# Patient Record
Sex: Female | Born: 1970 | Race: Black or African American | Hispanic: No | Marital: Single | Smoking: Former smoker
Health system: Southern US, Community
[De-identification: ages and names within clinical notes are randomized; demographics above are authoritative.]

## PROBLEM LIST (undated history)

## (undated) DIAGNOSIS — G4730 Sleep apnea, unspecified: Secondary | ICD-10-CM

## (undated) DIAGNOSIS — D649 Anemia, unspecified: Secondary | ICD-10-CM

## (undated) DIAGNOSIS — F32A Depression, unspecified: Secondary | ICD-10-CM

## (undated) DIAGNOSIS — T7840XA Allergy, unspecified, initial encounter: Secondary | ICD-10-CM

## (undated) DIAGNOSIS — I10 Essential (primary) hypertension: Secondary | ICD-10-CM

## (undated) DIAGNOSIS — E119 Type 2 diabetes mellitus without complications: Secondary | ICD-10-CM

## (undated) DIAGNOSIS — E785 Hyperlipidemia, unspecified: Secondary | ICD-10-CM

## (undated) DIAGNOSIS — Z5189 Encounter for other specified aftercare: Secondary | ICD-10-CM

## (undated) DIAGNOSIS — F419 Anxiety disorder, unspecified: Secondary | ICD-10-CM

## (undated) DIAGNOSIS — J40 Bronchitis, not specified as acute or chronic: Secondary | ICD-10-CM

## (undated) DIAGNOSIS — F329 Major depressive disorder, single episode, unspecified: Secondary | ICD-10-CM

## (undated) HISTORY — DX: Essential (primary) hypertension: I10

## (undated) HISTORY — PX: ABDOMINAL HYSTERECTOMY: SHX81

## (undated) HISTORY — DX: Type 2 diabetes mellitus without complications: E11.9

## (undated) HISTORY — PX: BARIATRIC SURGERY: SHX1103

## (undated) HISTORY — DX: Sleep apnea, unspecified: G47.30

## (undated) HISTORY — DX: Hyperlipidemia, unspecified: E78.5

## (undated) HISTORY — DX: Allergy, unspecified, initial encounter: T78.40XA

## (undated) HISTORY — PX: WISDOM TOOTH EXTRACTION: SHX21

## (undated) HISTORY — DX: Encounter for other specified aftercare: Z51.89

## (undated) HISTORY — DX: Anemia, unspecified: D64.9

## (undated) SURGERY — Surgical Case
Anesthesia: *Unknown

---

## 1997-10-25 ENCOUNTER — Emergency Department (HOSPITAL_COMMUNITY): Admission: EM | Admit: 1997-10-25 | Discharge: 1997-10-25 | Payer: Self-pay | Admitting: Emergency Medicine

## 1998-01-08 ENCOUNTER — Ambulatory Visit: Admission: RE | Admit: 1998-01-08 | Discharge: 1998-01-08 | Payer: Self-pay | Admitting: Internal Medicine

## 1999-08-10 ENCOUNTER — Emergency Department (HOSPITAL_COMMUNITY): Admission: EM | Admit: 1999-08-10 | Discharge: 1999-08-10 | Payer: Self-pay | Admitting: Emergency Medicine

## 2000-01-22 ENCOUNTER — Encounter: Payer: Self-pay | Admitting: Obstetrics and Gynecology

## 2000-01-22 ENCOUNTER — Observation Stay (HOSPITAL_COMMUNITY): Admission: AD | Admit: 2000-01-22 | Discharge: 2000-01-22 | Payer: Self-pay | Admitting: Obstetrics and Gynecology

## 2000-01-27 ENCOUNTER — Other Ambulatory Visit: Admission: RE | Admit: 2000-01-27 | Discharge: 2000-01-27 | Payer: Self-pay | Admitting: Obstetrics and Gynecology

## 2000-02-18 ENCOUNTER — Encounter: Admission: RE | Admit: 2000-02-18 | Discharge: 2000-02-18 | Payer: Self-pay | Admitting: Obstetrics and Gynecology

## 2000-06-23 ENCOUNTER — Emergency Department (HOSPITAL_COMMUNITY): Admission: EM | Admit: 2000-06-23 | Discharge: 2000-06-23 | Payer: Self-pay | Admitting: Emergency Medicine

## 2000-08-02 ENCOUNTER — Emergency Department (HOSPITAL_COMMUNITY): Admission: EM | Admit: 2000-08-02 | Discharge: 2000-08-02 | Payer: Self-pay | Admitting: Emergency Medicine

## 2000-09-03 ENCOUNTER — Other Ambulatory Visit: Admission: RE | Admit: 2000-09-03 | Discharge: 2000-09-03 | Payer: Self-pay | Admitting: Obstetrics and Gynecology

## 2000-09-03 ENCOUNTER — Encounter (INDEPENDENT_AMBULATORY_CARE_PROVIDER_SITE_OTHER): Payer: Self-pay

## 2001-10-17 ENCOUNTER — Emergency Department (HOSPITAL_COMMUNITY): Admission: EM | Admit: 2001-10-17 | Discharge: 2001-10-17 | Payer: Self-pay | Admitting: Emergency Medicine

## 2002-04-26 ENCOUNTER — Encounter: Payer: Self-pay | Admitting: Obstetrics and Gynecology

## 2002-04-26 ENCOUNTER — Ambulatory Visit (HOSPITAL_COMMUNITY): Admission: RE | Admit: 2002-04-26 | Discharge: 2002-04-26 | Payer: Self-pay | Admitting: Obstetrics and Gynecology

## 2002-07-11 ENCOUNTER — Encounter (INDEPENDENT_AMBULATORY_CARE_PROVIDER_SITE_OTHER): Payer: Self-pay | Admitting: Specialist

## 2002-07-11 ENCOUNTER — Ambulatory Visit (HOSPITAL_COMMUNITY): Admission: RE | Admit: 2002-07-11 | Discharge: 2002-07-11 | Payer: Self-pay | Admitting: Obstetrics and Gynecology

## 2003-04-21 HISTORY — PX: MYOMECTOMY: SHX85

## 2003-10-11 ENCOUNTER — Inpatient Hospital Stay (HOSPITAL_COMMUNITY): Admission: AD | Admit: 2003-10-11 | Discharge: 2003-10-12 | Payer: Self-pay | Admitting: Obstetrics and Gynecology

## 2003-11-29 ENCOUNTER — Encounter: Admission: RE | Admit: 2003-11-29 | Discharge: 2003-11-29 | Payer: Self-pay | Admitting: Obstetrics and Gynecology

## 2004-01-15 ENCOUNTER — Other Ambulatory Visit: Admission: RE | Admit: 2004-01-15 | Discharge: 2004-01-15 | Payer: Self-pay | Admitting: Obstetrics and Gynecology

## 2004-01-23 ENCOUNTER — Inpatient Hospital Stay (HOSPITAL_COMMUNITY): Admission: RE | Admit: 2004-01-23 | Discharge: 2004-01-25 | Payer: Self-pay | Admitting: Obstetrics and Gynecology

## 2004-01-23 ENCOUNTER — Encounter (INDEPENDENT_AMBULATORY_CARE_PROVIDER_SITE_OTHER): Payer: Self-pay | Admitting: Specialist

## 2004-03-31 ENCOUNTER — Ambulatory Visit (HOSPITAL_COMMUNITY): Admission: RE | Admit: 2004-03-31 | Discharge: 2004-03-31 | Payer: Self-pay | Admitting: Obstetrics and Gynecology

## 2004-04-14 ENCOUNTER — Inpatient Hospital Stay (HOSPITAL_COMMUNITY): Admission: AD | Admit: 2004-04-14 | Discharge: 2004-04-14 | Payer: Self-pay | Admitting: Obstetrics and Gynecology

## 2004-10-24 ENCOUNTER — Inpatient Hospital Stay (HOSPITAL_COMMUNITY): Admission: AD | Admit: 2004-10-24 | Discharge: 2004-10-24 | Payer: Self-pay | Admitting: Obstetrics and Gynecology

## 2004-11-05 ENCOUNTER — Inpatient Hospital Stay (HOSPITAL_COMMUNITY): Admission: AD | Admit: 2004-11-05 | Discharge: 2004-11-05 | Payer: Self-pay | Admitting: *Deleted

## 2005-04-20 HISTORY — PX: LAPAROSCOPIC VAGINAL HYSTERECTOMY: SUR798

## 2005-08-04 ENCOUNTER — Ambulatory Visit: Payer: Self-pay | Admitting: Cardiology

## 2005-08-04 ENCOUNTER — Inpatient Hospital Stay (HOSPITAL_COMMUNITY): Admission: EM | Admit: 2005-08-04 | Discharge: 2005-08-05 | Payer: Self-pay | Admitting: Emergency Medicine

## 2005-08-25 ENCOUNTER — Ambulatory Visit: Payer: Self-pay

## 2005-08-25 ENCOUNTER — Encounter: Payer: Self-pay | Admitting: Cardiology

## 2006-05-12 ENCOUNTER — Emergency Department (HOSPITAL_COMMUNITY): Admission: EM | Admit: 2006-05-12 | Discharge: 2006-05-12 | Payer: Self-pay | Admitting: Emergency Medicine

## 2006-07-08 ENCOUNTER — Emergency Department (HOSPITAL_COMMUNITY): Admission: EM | Admit: 2006-07-08 | Discharge: 2006-07-08 | Payer: Self-pay | Admitting: Emergency Medicine

## 2007-04-21 HISTORY — PX: OTHER SURGICAL HISTORY: SHX169

## 2007-05-25 ENCOUNTER — Encounter: Payer: Self-pay | Admitting: Emergency Medicine

## 2007-05-26 ENCOUNTER — Inpatient Hospital Stay (HOSPITAL_COMMUNITY): Admission: EM | Admit: 2007-05-26 | Discharge: 2007-05-27 | Payer: Self-pay | Admitting: Emergency Medicine

## 2007-05-27 ENCOUNTER — Emergency Department (HOSPITAL_COMMUNITY): Admission: EM | Admit: 2007-05-27 | Discharge: 2007-05-27 | Payer: Self-pay | Admitting: Emergency Medicine

## 2007-06-13 ENCOUNTER — Other Ambulatory Visit: Admission: RE | Admit: 2007-06-13 | Discharge: 2007-06-13 | Payer: Self-pay | Admitting: Gynecology

## 2007-08-21 ENCOUNTER — Inpatient Hospital Stay (HOSPITAL_COMMUNITY): Admission: AD | Admit: 2007-08-21 | Discharge: 2007-08-21 | Payer: Self-pay | Admitting: Gynecology

## 2007-09-01 ENCOUNTER — Encounter: Payer: Self-pay | Admitting: Gynecology

## 2007-09-01 ENCOUNTER — Ambulatory Visit (HOSPITAL_BASED_OUTPATIENT_CLINIC_OR_DEPARTMENT_OTHER): Admission: RE | Admit: 2007-09-01 | Discharge: 2007-09-02 | Payer: Self-pay | Admitting: Gynecology

## 2008-03-12 ENCOUNTER — Emergency Department (HOSPITAL_COMMUNITY): Admission: EM | Admit: 2008-03-12 | Discharge: 2008-03-12 | Payer: Self-pay | Admitting: Emergency Medicine

## 2008-05-09 ENCOUNTER — Ambulatory Visit: Payer: Self-pay | Admitting: Gynecology

## 2008-06-13 ENCOUNTER — Other Ambulatory Visit: Admission: RE | Admit: 2008-06-13 | Discharge: 2008-06-13 | Payer: Self-pay | Admitting: Gynecology

## 2008-06-13 ENCOUNTER — Encounter: Payer: Self-pay | Admitting: Gynecology

## 2008-06-13 ENCOUNTER — Ambulatory Visit: Payer: Self-pay | Admitting: Gynecology

## 2008-09-13 ENCOUNTER — Ambulatory Visit: Payer: Self-pay | Admitting: Gynecology

## 2009-06-12 ENCOUNTER — Emergency Department (HOSPITAL_COMMUNITY): Admission: EM | Admit: 2009-06-12 | Discharge: 2009-06-12 | Payer: Self-pay | Admitting: Emergency Medicine

## 2009-06-21 ENCOUNTER — Ambulatory Visit: Payer: Self-pay | Admitting: Gynecology

## 2009-06-21 ENCOUNTER — Other Ambulatory Visit: Admission: RE | Admit: 2009-06-21 | Discharge: 2009-06-21 | Payer: Self-pay | Admitting: Gynecology

## 2010-01-03 ENCOUNTER — Ambulatory Visit: Payer: Self-pay | Admitting: Gynecology

## 2010-04-20 HISTORY — PX: BUNIONECTOMY: SHX129

## 2010-06-23 ENCOUNTER — Encounter: Payer: Self-pay | Admitting: Gynecology

## 2010-07-07 ENCOUNTER — Encounter: Payer: Self-pay | Admitting: Gynecology

## 2010-08-14 ENCOUNTER — Other Ambulatory Visit (HOSPITAL_COMMUNITY)
Admission: RE | Admit: 2010-08-14 | Discharge: 2010-08-14 | Disposition: A | Discharge status: Home / Self Care | Payer: Managed Care, Other (non HMO) | Source: Ambulatory Visit | Attending: Gynecology | Admitting: Gynecology

## 2010-08-14 ENCOUNTER — Encounter (INDEPENDENT_AMBULATORY_CARE_PROVIDER_SITE_OTHER): Payer: Managed Care, Other (non HMO) | Admitting: Gynecology

## 2010-08-14 ENCOUNTER — Other Ambulatory Visit: Payer: Self-pay | Admitting: Gynecology

## 2010-08-14 DIAGNOSIS — N760 Acute vaginitis: Secondary | ICD-10-CM

## 2010-08-14 DIAGNOSIS — Z01419 Encounter for gynecological examination (general) (routine) without abnormal findings: Secondary | ICD-10-CM

## 2010-08-14 DIAGNOSIS — R809 Proteinuria, unspecified: Secondary | ICD-10-CM

## 2010-08-14 DIAGNOSIS — Z124 Encounter for screening for malignant neoplasm of cervix: Secondary | ICD-10-CM | POA: Insufficient documentation

## 2010-09-02 NOTE — Discharge Summary (Signed)
NAME:  Catherine [name], Catherine [name]                 ACCOUNT NO.:  [id number]   MEDICAL RECORD NO.:  [id number]          PATIENT TYPE:  INP   LOCATION:  1439                         FACILITY:  [facility]   PHYSICIAN:  Hillery Aldo, M.D.   DATE OF BIRTH:  02/23/1971   DATE OF ADMISSION:  05/25/2007  DATE OF DISCHARGE:  05/27/2007                               DISCHARGE SUMMARY   PRIMARY CARE PHYSICIAN:  Dr. Renaye Rakers.   DISCHARGE DIAGNOSES:  1. Severe iron deficiency anemia status post 4 units of packed red      blood cells.  2. Hypertension.  3. Recurrent uterine fibroids   DISCHARGE MEDICATIONS:  1. Nu-Iron 150 b.i.d.  2. Benicar/hydrochlorothiazide:  Resume home dosage.   CONSULTATIONS:  None.   BRIEF ADMISSION HPI:  This 40 year old female presented to the hospital  with complaints of racing heart and dyspnea with exertion.  Upon initial  evaluation in the emergency department, she was found to have a  hemoglobin of 6 and was subsequently admitted for further evaluation and  blood transfusion.  For the full details, please see the dictated report  done by Dr. Roxan Hockey.   PROCEDURES AND DIAGNOSTIC STUDIES:  1. Two views of the chest on May 25, 2007 showed stable chronic      bilateral parenchymal scarring and cardiomegaly.  No active      disease.  2. Abdominal/transvaginal ultrasound on May 26, 2007 showed      probable fibroid disease with the largest cluster in the fundus.      There was trace fluid.   DISCHARGE LABORATORY VALUES:  Sodium was 141, potassium 4.5, chloride  111, bicarb 24, BUN 11, creatinine 0.75, glucose 101. White blood cell  count was 9.0, hemoglobin 10, hematocrit 32.3, platelets 254.   HOSPITAL COURSE BY PROBLEM:  1. One iron deficiency anemia:  The patient was admitted and an anemia      panel was checked which showed a iron level of 14, TIBC of 474, 3%      saturation, and a ferritin of less than 1, all consistent with iron      deficiency anemia.  Her  B12 and folate studies were within normal      limits.  The patient, upon questioning, admitted to heavy periods      with a history of fibroid-induced menorrhagia in the past status      post myomectomy in the past.  It was felt that the patients iron      deficiency was likely due to regrowth of fibroid tissue prompting      [place] to obtain an ultrasound of her abdomen.  The ultrasound did show      regrowth of fibroid tissue.  It was felt that the patient's iron      deficiency is secondary to recurrent menorrhagia due to recurrent      fibroid growth.  The patient's hemoglobin and hematocrit have      remained stable status post 4 units of packed red blood cells.  She      is not complaining of any melena  or hematochezia.  She was put on      an iron supplement at discharge and instructed to follow up with      her primary care physician for an OB/GYN referral.  2. Hypertension:  The patient was normotensive throughout her hospital      stay.  She is instructed to resume her antihypertensives on      discharge as her final blood pressure did reveal a diastolic      pressure of 97 mmHg. .  3. Fibroids:  Again, the patient was instructed to follow up with an      OB/GYN for consideration of treatment given her severe iron      deficiency anemia.   DISPOSITION:  The patient is medically stable and will be discharged  home.      Hillery Aldo, M.D.  Electronically Signed     CR/MEDQ  D:  05/27/2007  T:  05/28/2007  Job:  [id number]   cc:   Renaye Rakers, M.D.  Fax: [phone]

## 2010-09-02 NOTE — H&P (Signed)
NAME:  Catherine [name], Catherine [name]                 ACCOUNT NO.:  [id number]   MEDICAL RECORD NO.:  [id number]          PATIENT TYPE:  INP   LOCATION:  1827                         FACILITY:  [facility]   PHYSICIAN:  Michaelyn Barter, M.D. DATE OF BIRTH:  [birth year]   DATE OF ADMISSION:  05/25/2007  DATE OF DISCHARGE:                              HISTORY & PHYSICAL   PRIMARY CARE PHYSICIAN:  Renaye Rakers, MD   CHIEF COMPLAINT:  Shortness of breath.   HISTORY OF PRESENT ILLNESS:  Catherine [name] is a 40 year old female with a  past medical history of hypertension and chronic anemia, who states that  last night/early this morning at approximately 2:30 she ran up a flight  of stairs to use the bathroom.  After urinating she went to lay down.  Shortly after lying in bed she began to feel shortness of breath and her  heart began to race. She also felt hot over her face and shaky.  This  sensation lasted 10 minutes.  She indicated that she splashed some cold  water on her face and was able to return to bed and sleep.  Approximately 2 hours later she had to urinate again.  Therefore she  went to the bathroom.  After going go the bathroom she returned to her  bed.  Sometime there afterwards she had developed another episode of  shortness of breath, racing heartbeat, hot sensation of her face.  This  too was short-lived.  Later in the morning while at work, the patient  states that she felt tired throughout the day.  After leaving work and  returning home, she began to feel warm in her face.  Therefore She  decided to call EMS.  She denies having any chest pain throughout the  episodes of shortness of breath and racing heartbeat.  There were no  fevers, no nausea, no vomiting.  No obvious bright red blood in her  stools.   PAST MEDICAL HISTORY:  1. Hypertension.  2. Chronic anemia.  3. Questionable history of sarcoidosis.  4. The patient has a history of severe microcytic, hypochromic anemia      that dates back  as far as April 2007 and at that time the patient      was noted to have had a hemoglobin of 6.3 for which she received a      blood transfusion which increased her hemoglobin to 8.6 with a      hematocrit of 28.4.  During that visit the patient also presented      with chest pain.   PAST SURGICAL HISTORY:  C-section x2.   CURRENT MEDICATIONS:  The patient currently states that she has not  taken any medications since November of 2008.  Prior to that she was  taking Benicar hydrochlorothiazide.   ALLERGIES:  NO KNOWN DRUG ALLERGIES.   SOCIAL HISTORY:  The patient smokes approximately 1/2 a pack of  cigarettes per day and has been doing so for at least 15 years.  Alcohol  occasionally.   FAMILY HISTORY:  Mother has history of hypertension and asthma.  Father  died from heart disease while in his 59s.   REVIEW OF SYSTEMS:  As per HPI.   PHYSICAL EXAMINATION:  GENERAL:  The patient is awake, cooperative, in  no obvious respiratory distress.  VITAL SIGNS:  Her temperature is 97.5.  Blood pressure 143/92.  Heart  rate 84.  Respirations 12.  O2 sat is 100%.  HEENT:  Normocephalic, atraumatic, anicteric.  Extraocular movements are  intact.  Oral mucosa is pink.  No thrush, no exudates.  NECK:  Supple, no JVD, no lymphadenopathy.  No thyromegaly.  CARDIAC:  S1 and S2 present.  Regular rate and rhythm.  RESPIRATORY:  No crackles or wheezes.  ABDOMEN:  Soft, nontender, nondistended.  Positive bowel sounds.  EXTREMITIES:  No leg edema.  NEUROLOGIC:  The patient is alert and oriented x3.  Cranial nerves 2-12  are intact.  MUSCULOSKELETAL:  5/5 upper and lower extremity strength.   LABS:  Sodium 132, potassium 3.7, chloride 104, CO2 23, glucose 107, BUN  8, creatinine 0.71, bilirubin total 0.8, alk phos 43, SGOT 19, SGPT 14,  total protein 6.8, albumin 4.2, calcium 8.7.  WBC 6.1.  Hemoglobin 6.0.  Hematocrit 22.1.  Platelets 353.  Urinalysis negative.   EKG reveals normal sinus rhythm.   No Q-waves, no ST segment  abnormalities.   ASSESSMENT AND PLAN:  1. Severe anemia.  Source of the patient's anemia is questionable.      Prior discharge summaries indicate that the patient chronically      suffers from severe anemia.  Will check the patient's stools for      occult blood.  Will transfuse the patient at least 4 units of      prbcs.  Will monitor her hemoglobin and hematocrit q.8 h. over the      next 24 hours.  Consider consultation with GI versus hematology.      Will also start the patient on Protonix.  2. Hypertension.  The patient's blood pressure currently is slightly      uncontrolled.  Will start the patient on antihypertensive      medication.      Michaelyn Barter, M.D.  Electronically Signed     OR/MEDQ  D:  05/25/2007  T:  05/25/2007  Job:  [id number]   cc:   Renaye Rakers, M.D.

## 2010-09-02 NOTE — H&P (Signed)
NAME:  Catherine [name], Catherine [name]                 ACCOUNT NO.:  [id number]   MEDICAL RECORD NO.:  [id number]           PATIENT TYPE:   LOCATION:                                 FACILITY:   PHYSICIAN:  Timothy P. Fontaine, M.D.DATE OF BIRTH:  1971/01/27   DATE OF ADMISSION:  09/01/2007  DATE OF DISCHARGE:                              HISTORY & PHYSICAL   CHIEF COMPLAINT:  Menorrhagia, leiomyoma, severe iron deficiency anemia.   HISTORY OF PRESENT ILLNESS:  A 40 year old G39 P2 AB2 female presents to  [facility] Emergency Room complaining of shortness of breath and was  found to have a hemoglobin of 5.  Evaluation confirmed multiple small  myomas.  She is status post myomectomy in the past with a history of  heavier menses.  The patient was subsequently placed on Lupron for  menstrual suppression, hemoglobin recovery.  Options for management were  reviewed with her to include repeat myomectomy, uterine artery  embolization, endometrial ablation, hormonal suppression, hysterectomy -  all of which was discussed, understood and accepted, and she wants to  proceed with hysterectomy.  She had a sonohysterogram which confirmed  multiple small myomas, the largest measuring 32 with no intracavitary  abnormalities.  The patient's follow-up hemoglobin was 9.5 after being  placed on high-dose iron.  She had a normal thyroid function, and she is  admitted at this time for hysterectomy.   PAST MEDICAL HISTORY:  Significant for:  1. Anxiety.  2. Panic disorder.   PAST SURGICAL HISTORY:  Includes:  1. Myomectomy 2005.  2. Cesarean section x2.   CURRENT MEDICATIONS:  1. Iron supplementation.  2. Lexapro 10 mg daily.   ALLERGIES:  SIGNIFICANT FOR LATEX.   REVIEW OF SYSTEMS:  Noncontributory.   FAMILY HISTORY:  Noncontributory.   SOCIAL HISTORY:  Noncontributory.   ADMISSION PHYSICAL EXAM:  Afebrile.  Vital signs are stable.  HEENT: Normal.  LUNGS:  Clear.  CARDIAC:  Regular rate without rubs, murmurs  or gallops.  ABDOMINAL EXAM:  Benign.  PELVIC:  External, BUS, vagina normal.  Cervix normal.  Uterus mildly  enlarged, nontender.  Adnexa without masses or tenderness.   ASSESSMENT:  A 40 year old G26 P2, AB2 female with history of leiomyoma  status post myomectomy in the past, worsening menorrhagia, found to be  severely iron-deficient anemia with hemoglobin of 5, placed on high dose  iron, recovery to 9, subsequently placed on Lupron for menstrual  suppression, for hysterectomy.  Options for management to include  conservative hormonal manipulation, repeat myomectomy, uterine artery  embolization, endometrial ablation attempt, hysterectomy were all  reviewed, and she wants to proceed with hysterectomy.  The patient and I  discussed the absolute irreversible sterility associated with  hysterectomy.  She is comfortable with this decision.  Sexuality  following hysterectomy was also reviewed and the potential for orgasmic  dysfunction as well as persistent dyspareunia was discussed, understood  and accepted.  Ovarian conservation issues were reviewed.  The options  of keeping both ovaries versus removing both ovaries were discussed, and  the issues and risks of both decisions reviewed, and the  patient wants  to keep both of her ovaries.  She understands that she is at risk in the  future for ovarian disease that may require reoperation as well as the  risk of ovarian cancer in the future, and she understands and accepts  this.  She does give me permission to remove one or both ovaries if  significant disease is encountered during the surgery.  We plan on  attempted LAVH.  She does understand particularly given her past  surgical history with myomectomy and cesarean that there is a potential  for significant adhesive disease and that we may not be able to complete  the procedure laparoscopically, and at that point, we will convert to an  abdominal approach to do an abdominal hysterectomy.   The acute  intraoperative and postoperative risks associated with hysterectomy were  reviewed, instrumentation, trocar placement, multiple port sites,  insufflation, use of sharp and blunt dissection, electrocautery,  harmonic scalpel all were discussed, and the risks of infection  requiring prolonged antibiotics, abscess formation or hematoma formation  requiring reoperation and drainage, wound complications requiring  opening and draining of incisions, closure by secondary intention, long-  term wound issues to include scar formation, keloid and cosmetic issues  as well as the risk of hernia formation were all discussed, understood  and accepted.  The risks of hemorrhage necessitating transfusion and  risks of transfusion were reviewed to include transfusion reaction,  hepatitis, HIV, Mad Cow Disease, and other unknown entities.  Her last  hemoglobin was 9.5 before Lupron suppression, and her latest hemoglobin  is pending at time of this dictation.  The realistic risk of damage to  internal organs including bowel, bladder, ureters, vessels and nerves  were reviewed, particular with her surgical history.  She is on bowel  prep and will receive antibiotic prophylaxis, but she clearly  understands that there is a risk of damage to these structures that may  necessitate major exploratory reparative surgeries or future reparative  surgeries to include bowel resection, bladder repair, ureteral damage  repair, reimplantation of to including ostomy formation were all  discussed, understood and accepted.  The patient's questions were  answered to her satisfaction.  She is ready to proceed with surgery.      Timothy P. Fontaine, M.D.  Electronically Signed     TPF/MEDQ  D:  08/25/2007  T:  08/25/2007  Job:  [id number]

## 2010-09-02 NOTE — Discharge Summary (Signed)
NAME:  Starrett, Jerzey                 ACCOUNT NO.:  [id number]   MEDICAL RECORD NO.:  [id number]          PATIENT TYPE:  AMB   LOCATION:  [facility]                         FACILITY:  [facility]   PHYSICIAN:  Timothy P. Fontaine, M.D.DATE OF BIRTH:  1970-04-29   DATE OF ADMISSION:  09/01/2007  DATE OF DISCHARGE:  09/02/2007                               DISCHARGE SUMMARY   DISCHARGE DIAGNOSES:  1. Leiomyomata uteri.  2. Menorrhagia.  3. Severe iron deficiency anemia.   PROCEDURE:  Laparoscopic-assisted vaginal hysterectomy Sep 01, 2007,  pathology pending.   HOSPITAL COURSE:  A 40 year old, G5, P2 female severe iron deficiency  anemia with a prior hemoglobin of 5 underwent uncomplicated LAVH with  findings of multiple leiomyoma.  The patient's postop course was  uncomplicated and she was discharged on postoperative day #1 ambulating  well, tolerating a regular diet with a postoperative hemoglobin of 9,  preoperative hemoglobin of 10.  The patient received precautions,  instructions and follow-up and will be seen in the office 2 weeks  following discharge and received a prescription for Tylox #20 one to two  p.o. q. 4-6 hours p.r.n. pain.      Timothy P. Fontaine, M.D.  Electronically Signed     TPF/MEDQ  D:  09/02/2007  T:  09/02/2007  Job:  [id number]

## 2010-09-02 NOTE — Op Note (Signed)
NAME:  Catherine [name], Catherine [name]                 ACCOUNT NO.:  [id number]   MEDICAL RECORD NO.:  [id number]          PATIENT TYPE:  AMB   LOCATION:  NESC                         FACILITY:  [facility]   PHYSICIAN:  Timothy P. Fontaine, M.D.DATE OF BIRTH:  [birth year]   DATE OF PROCEDURE:  09/01/2007  DATE OF DISCHARGE:                               OPERATIVE REPORT   PREOPERATIVE DIAGNOSES:  1. Leiomyoma.  2. Menorrhagia.  3. Iron-deficiency anemia.  4. Skin lesion.   POSTOPERATIVE DIAGNOSES:  1. Leiomyoma.  2. Menorrhagia.  3. Iron-deficiency anemia.  4. Skin lesion.   PROCEDURE:  Laparoscopic-assisted vaginal hysterectomy, excision of  anterior abdominal wall skin lesion.   SURGEON:  Dr. Colin Broach.   ASSISTANT:  Dr. Eda Paschal.   ANESTHETIC:  General.   COMPLICATIONS:  None.   ESTIMATED BLOOD LOSS:  200 mL.   SPECIMEN:  1. Uterus morcellated.  2. Skin lesion   FINDINGS:  EUA external BUS and vagina normal.  Cervix normal, uterus  approximately 12 weeks size, nodular consistent with leiomyoma.  Adnexa  without gross masses.  Anterior abdominal wall with a raised umbilicated  skin lesion left of the umbilicus. Laparoscopic uterus enlarged with  multiple myomas, anterior posterior cul-de-sacs normal, right and left  ovaries grossly normal, right and left fallopian tube segments grossly  normal, upper abdominal exam grossly normal.   INDICATIONS:  A 40 year old, G36, P2 female with a significant iron  deficiency anemia due to her menorrhagia with leiomyoma, hemoglobin of 5  due to her bleeding.  The patient also notes the morning of surgery that  she has a newly developed skin lesion to the left of her umbilicus that  she asked me to remove at the time of surgery.   PROCEDURE:  The patient was taken to the operating room, underwent  general anesthesia and was placed in the low dorsal lithotomy position,  received an abdominal perineal vaginal preparation with Betadine  solution.   Bladder emptied with indwelling Foley catheterization, placed  in sterile technique.  EUA performed and the patient was draped in the  usual fashion.  Latex allergy precautions were used during the  procedure.  A vertical infraumbilical incision was made. The 10-mm  laparoscopic trocar direct entry OptiVu scope was placed within the  abdomen under direct visualization without difficulty and the abdomen  was subsequently insufflated.  Examination of pelvic organs and upper  abdominal exam was carried out with findings noted above.  The right and  left suprapubic 5-mm ports were then placed under direct visualization  after transillumination for the vessels without difficulty.  The Hulka  tenaculum had been placed on the cervix during the EUA to aid in  manipulation during the procedure. The uterus was elevated from the  pelvis using the harmonic scalpel. The right and left uterine ovarian  pedicles were identified and transected without difficulty. The  parametrial tissues were then transected bilaterally using the harmonic  scalpel and the right and left the round ligaments were transected again  using the harmonic scalpel.  The anterior vesicouterine peritoneal fold  was then sharply incised  and the bladder flap was sharply bluntly  developed initially.  At this point, attention was then turned to the  vaginal portion of the procedure. The patient was placed in the high  dorsal lithotomy position, the cervix visualized with a weighted  speculum, the Hulka tenaculum removed.  The cervix grasped with a Jacobs  tenaculum and the cervical mucosa was circumferentially injected using  lidocaine epinephrine mixture. The cervical mucosa was then  circumferentially incised and the paracervical plane sharply developed.  The anterior vesicouterine plane was sharply developed and ultimately  the anterior cul-de-sac was entered.  The posterior cul-de-sac was then  sharply entered without difficulty.   A long weighted speculum was placed  and the right and left uterosacral ligaments were clamped, cut and  ligated using zero Vicryl suture and tagged for future reference.  The  uterus was then progressively freed from its attachments through  clamping, cutting and ligating of the paracervical cardinal ligaments  and the parametrial tissues using zero Vicryl suture. The right and left  uterine vessels were identified and ligated using zero Vicryl suture and  subsequently the uterus was ultimately delivered through the vagina  through coring and removing the uterus in separate segments.  The  remaining attached parametrial tissues were clamped, cut and ligated  using zero Vicryl suture.  The cul-de-sac was irrigated.  Hemostasis was  visualized.  The long weighted speculum replaced by a short weighted  speculum, the posterior vaginal cuff grasped with an Allis clamp.  A  tail sponge placed within the cul-de-sac to pack the intestines from the  operative field.  The posterior vaginal mucosa was run from uterosacral  ligament to uterosacral ligament using zero Vicryl suture in a running  interlocking stitch.  The cul-de-sac was irrigated,  again hemostasis  visualized, the  packing removed and the vagina closed anterior to  posterior using zero Vicryl suture in interrupted figure-of-eight  stitch.  Irrigation of the vagina showed adequate hemostasis. The  patient was placed in the low dorsal lithotomy position. After  regloving, the abdomen was reinsufflated and relaparoscopy showed  hemostasis at all the surgical sites internally.  All surgical sites  were reinspected under a low pressure situation showing adequate  hemostasis.  The 5-mm ports were removed. Again hemostasis visualized  and the infraumbilical port was then backed out under direct  visualization showing adequate hemostasis.  No evidence of hernia  formation. Interrupted zero Vicryl suture was placed infraumbilically, a   subcutaneous fascial stitch.  All skin incisions injected using 0.25%  Marcaine and all skin incisions closed using 4-0 plain suture in simple  cuticular stitch.  The raised periumbilical skin lesion was then excised  in its entirety and the skin incisions closed using 4-0 plain in an  interrupted stitch.  Sterile dressings were applied.  The patient placed  in the supine position, awakened without difficulty and taken to the  recovery room in good condition having tolerated the procedure well.      Timothy P. Fontaine, M.D.  Electronically Signed     TPF/MEDQ  D:  09/01/2007  T:  09/01/2007  Job:  [id number]

## 2010-09-05 NOTE — Op Note (Signed)
NAME:  Catherine [name], Catherine [name]                 ACCOUNT NO.:  [id number]   MEDICAL RECORD NO.:  [id number]          PATIENT TYPE:  INP   LOCATION:  9399                          [facility]ILITY:  [facility]   PHYSICIAN:  Naima A. Dillard, M.D. DATE OF BIRTH:  [birth year]   DATE OF PROCEDURE:  01/23/2004  DATE OF DISCHARGE:                                 OPERATIVE REPORT   PREOPERATIVE DIAGNOSES:  Symptomatic fibroids.   POSTOPERATIVE DIAGNOSES:  Symptomatic fibroids.   PROCEDURE:  Abdominal myomectomy.   ANESTHESIA:  General endotracheal tube.   ESTIMATED BLOOD LOSS:  200 mL.   URINE OUTPUT:  650 mL.   IV FLUIDS:  2000 mL crystalloid.   SURGEON:  Naima A. Normand Sloop, M.D.   ASSISTANT:  Osborn Coho, M.D.   FINDINGS:  About 10 week size uterus with multiple fibroids, about 20  fibroids were removed, some as large as 2 cm and some as small as 1/2 cm.  Normal appearing tubes and ovaries bilaterally, normal abdominal anatomy.   The patient was consented for the procedure, taken to the operating room and  given general anesthesia, placed in the dorsal supine position.  A  Pfannenstiel skin incision was then made with the scalpel over top of her  previous incision and carried down to the fascia using Bovie cautery. The  fascia was then incised in the midline and extended bilaterally using Bovie  cautery.  A Kocher __________ placed on the inferior aspect of the fascia  which was dissected off the rectus muscle both sharply and bluntly. The  superior aspect of the fascia was dissected off the rectus muscle in a  similar fashion. The rectus muscle was separated in the midline, peritoneum  was identified and entered sharply and extended bilaterally.  The patient  was placed in Trendelenburg, the bowel was packed away with moist laparotomy  sponges, O'Connor-O'Sullivan retractor was placed and epinephrine was 1:200  mL were placed anteriorly. An incision was made from the fundus to the  middle of the  anterior portion of the uterus and several fibroids were  dissected out both bluntly and sharply. Another small incision was made  right at the back of the uterus about 3 cm and fibroids removed both sharply  and bluntly. Both tubes remained intact. The integrity of the tubes were  kept intact. The endometrium was not opened or entered. The myometrium was  then reapproximated using #0 Vicryl with figure-of-eight stitches and also a  running locked stitch. The serosa was then reapproximated using 3-0 Vicryl.  The uterus was then irrigated and noted to be hemostatic. Intercede was  placed both posteriorly and anteriorly on top of the uterus. The uterus was  returned to the abdominal cavity. The peritoneum was closed using 2-0  Vicryl. Any bleeding area seen on the muscle was made hemostatic with Bovie  cautery and the fascia was closed  with #0 Vicryl in a running fashion. The subcutaneous layer was irrigated  and made hemostatic with the Bovie cautery. The skin was closed with 3-0  Monocryl in a subcuticular fashion. Sponge, lap and needle counts  were  correct x2. The patient wen to the recovery room in stable condition.      NAD/MEDQ  D:  01/23/2004  T:  01/23/2004  Job:  [id number]

## 2010-09-05 NOTE — Discharge Summary (Signed)
NAME:  Catherine [name], Catherine [name]                 ACCOUNT NO.:  [id number]   MEDICAL RECORD NO.:  [id number]          PATIENT TYPE:  INP   LOCATION:  9309                          [facility]  [facility]   PHYSICIAN:  Naima A. Dillard, M.D. DATE OF BIRTH:  November 01, 1970   DATE OF ADMISSION:  01/23/2004  DATE OF DISCHARGE:                                 DISCHARGE SUMMARY   DISCHARGE MEDICATIONS:  Motrin, Cardizem, Percocet, K-Dur,  hydrochlorothiazide, and Colace.   PREADMISSION DIAGNOSES:  1.  Symptomatic fibroids.  2.  Secondary infertility.   POSTOPERATIVE DIAGNOSIS:  Status post abdominal myomectomy.   The patient is to go home and remain on pelvic rest.  CCOB instructions  given.  The patient is to follow up with me in 6 weeks, on February 29, 2004, at 9:45 a.m.   HOSPITAL COURSE:  [name] underwent a total abdominal myomectomy on  January 23, 2004 without difficulty.  On postoperative day #0 she had some  nausea but no vomiting.  Her Tmax was as high as 100.6.  The rest of her  vital signs remained stable.  On postoperative day #1, the patient was  without complaint.  She had no nausea and vomiting.  She was tolerating  clear liquids.  Tmax was 100.6.  Abdomen was soft, nondistended.  Incision  was clean, dry, and intact.  Preoperative hemoglobin was 13.1, postoperative  hemoglobin was 12.1, platelets 258, and white count 12.4.  The patient was  given Motrin around the clock.  She was given a regular diet and her  Cardizem, because of her increased blood pressure, was increased to 240 mg  q.h.s. with hydrochlorothiazide in the morning per her primary care doctor,  Dr. Allyne Gee.  On postoperative day #2, the patient had remained afebrile for  greater than 24 hours, stable vital signs.  She was tolerating p.o., with  flatus.  Systolic blood pressure was 131 to 152 with diastolic blood  pressure 83 to 95.  Heart rate was regular rate and rhythm.  Lungs were  clear to auscultation bilaterally.   Abdomen was nondistended, soft, and  nontender.  Incision had some mild serous drainage.  Her incision was probed  with a Q-tip and no seroma or hematoma was seen.  Extremities had no  cyanosis, clubbing, or edema.  The patient is stable and deemed to have  benefited from her hospital stay.  She will be discharged home.  She was  told to call with any greenish discharge or heavy discharge from her  incision, or heavy vaginal bleeding.  The patient was to remain on pelvic  rest.  She was given strict instructions and will follow up with me in 6  weeks.   CONDITION ON DISCHARGE:  Stable.  The patient will be discharged to home.      NAD/MEDQ  D:  01/25/2004  T:  01/25/2004  Job:  [id number]

## 2010-09-05 NOTE — Discharge Summary (Signed)
NAME:  Catherine [name], Catherine [name]                 ACCOUNT NO.:  [id number]   MEDICAL RECORD NO.:  [id number]          PATIENT TYPE:  INP   LOCATION:  NA                            [facility]   PHYSICIAN:  Naima A. Dillard, M.D. DATE OF BIRTH:  05-11-1970   DATE OF ADMISSION:  DATE OF DISCHARGE:                                 DISCHARGE SUMMARY   CHIEF COMPLAINT:  Symptomatic fibroids, anemia, and secondary infertility.   HISTORY AND PHYSICAL:  The patient is a 40 year old African American female,  gravida 4, para 2-0-2-2, who is having an abdominal myomectomy secondary to  severe anemia, secondary to menometrorrhagia.  The patient has tried iron  with NSAIDs without any relief.  The patient has had a hemoglobin as low as  6.8 and has a history of a blood transfusion in the past.  The patient was  diagnosed with fibroids on her most recent ultrasound done in June 2005.  Uterine length was 12.7 x 8.0 x 6.4.  The patient noted three large fibroids  posterior mid was 2.8-cm and also 1.2-cm.  In the anterior portion of the  uterus is a 3-cm fibroid and in the fundal region there is a 1.8-cm fibroid.  The patient desires to conceive and has decided for abdominal myomectomy for  treatment of fibroids.  All treatment of fibroids were reviewed.  The  patient was also offered a referral for laparoscopic myomectomy and has  decided to proceed with abdominal myomectomy.  Risks and benefits of all  procedures reviewed.  The patient denies having any bleeding disorders.   PAST MEDICAL HISTORY:  As above.   PAST OBSTETRIC HISTORY:  1.  Elective abortion x 2 in the first trimester.  2.  Cesarean section x 2 without any complications.   MEDICATIONS:  Folic acid, iron, and Lupron.  The patient received Lupron  three months ago in order to help build up her iron stores.   SOCIAL HISTORY:  Significant for 5-6 cigarettes a day.  No alcohol or drug  use.   FAMILY HISTORY:  Significant for diabetes,  hypertension.  Denies any GYN  cancer.   PAST MEDICAL HISTORY:  For the patient is significant for high blood  pressure.   PAST SURGICAL HISTORY:  In March 2004, the patient had a diagnostic  laparoscopy in which she was noted to have a left proximal tubal occlusion.   REVIEW OF SYSTEMS:  CARDIOVASCULAR:  Significant for increased blood  pressure.  GENITOURINARY:  As above.  MUSCULOSKELETAL:  Unremarkable.  PSYCHIATRIC:  Unremarkable.  RESPIRATORY:  Unremarkable.   PHYSICAL EXAMINATION:  VITAL SIGNS:  The patient's blood pressure is  150/100, weight is 221.  EYES:  Pupils are equal.  EARS:  Hearing is normal.  THROAT:  Clear.  NECK:  Thyroid is not enlarged.  HEART:  Regular rate and rhythm.  LUNGS:  Clear to auscultation bilaterally.  BREASTS:  No masses, discharge, skin changes, or nipple retraction.  BACK:  No CVA tenderness bilaterally.  ABDOMEN:  Nontender.  No masses or organomegaly.  EXTREMITIES:  No cyanosis, clubbing, or edema.  NEURO:  Within normal limits.  __________  vaginal exam is within normal limits.  Cervix is nontender and  without lesions.  Uterus is about 9 weeks size, slightly irregular.  Adnexa  have no masses.   ASSESSMENT:  1.  Symptomatic fibroids.  The patient is currently on Lupron.  2.  History of anemia.  3.  History of a left proximal tubal occlusion.   PLAN:  Do an abdominal myomectomy.  The patient understands the risks are  but not limited to bleeding, infection, damage to internal organs like  bowel, bladder, major blood vessels, risk of a transfusion, and also a risk  of a hysterectomy in the event of hemorrhage which could lead to permanent  infertility.  The patient was also offered referral for a laparoscopic  myomectomy but has decided to proceed with abdominal myomectomy.  The  patient has proximal tubal occlusion which could not be fixed during  laparoscopy, so the patient was referred to Dr. Kearney Hard, interventional  radiologist, for  cannulization of the fallopian tube.  The patient will  proceed with that six weeks after having a myomectomy.  The patient  understands the risk of the procedure and desires to proceed.      NAD/MEDQ  D:  01/22/2004  T:  01/22/2004  Job:  [id number]

## 2010-09-05 NOTE — H&P (Signed)
NAME:  Catherine [name], ATTAWAY                 ACCOUNT NO.:  [id number]   MEDICAL RECORD NO.:  [id number]          PATIENT TYPE:  INP   LOCATION:  1832                         FACILITY:  [facility]   PHYSICIAN:  Olga Millers, M.D. LHCDATE OF BIRTH:  1970-11-28   DATE OF ADMISSION:  08/04/2005  DATE OF DISCHARGE:                                HISTORY & PHYSICAL   NOTE:  This patient has two medical record numbers.  The second record  number is [id number].   SUMMARY OF HISTORY:  Catherine [name] is a 40 year old African American female who  presented to [facility] Emergency Room complaining of  chest discomfort.   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  This discomfort did not radiate nor was  it associated with nausea, vomiting or shortness of breath.  Her discomfort  was 8 on a scale of 0 to 10.  She told her supervisor and was able to take a  break and drink some water and relax.  She said the discomfort eventually  eased off.  She is not quite sure of the duration but she feels it was less  than 30 minutes and it was a 0 on a scale of 0 to 10.  She was able to  finish her work shift without difficulty.  After visiting her mother  yesterday evening while driving home and at approximately 11p.m. she  developed the same symptoms associated with tingling in her left arm and in  her lips.  She did not have any radiation, nausea or vomiting or shortness  of breath.  She preceded to the emergency room for further evaluation.   In the emergency room, an EKG did not show any acute changes.  However, the  point of care markers were slightly abnormal in regards to her troponins and  her second MB.  Thus, we were consulted.   She also describes increasing amount of headaches over the last week or two  and she has attributed this to high blood  pressure as she has not been able  to take her medication secondary to financial reasons for the preceding two  weeks.  She also recalls a similar occurrence in 2001, for which she was  evaluated by EMS and they told her this was related to anxiety; however, she  thinks the above episode is much more intense than it was in 2001.   ALLERGIES:  NO KNOWN DRUG ALLERGIES.   MEDICATIONS PRIOR TO ADMISSION:  Benicar/hydrochlorothiazide unknown dosage  daily.  Sular at unknown dosage daily.  She has not had an Sular for the  preceding two weeks secondary to finances.   PAST MEDICAL HISTORY:  1.  Hypertension which she does not check at home.  2.  G4, P2-0-2-2.  3.  Chronic anemia.  4.  Sarcoidosis (uncertain of how diagnosis was made, no records available).   PAST SURGICAL HISTORY:  1.  Cesarean section x2.  2.  Status post myomectomy in  October 2005, secondary to fibroids.   She denies any problems with diabetes, CVA, myocardial infarction, COPD,  bleeding, dyscrasias or thyroid dysfunction.   SOCIAL HISTORY:  She resides in [place], [place], with her two  daughters.  She is employed at [employer].  She smokes  approximately half a pack per day and has been doing so for the last 15  years.  She may have a mixed drink every two months.  She denies any drugs  or herbal medications.  She has recently been trying to follow a low salt  diet.  She does not exercise.   FAMILY HISTORY:  Mother is alive in her 52s with a history of hypertension  and asthma.  Her father is deceased in his 14s, specifics unknown but she  states that he had fluid around his heart.  She has one brother deceased in  his 68s with HIV/AIDS.  One sister alive in her 27s with history of  hypertension.   REVIEW OF SYSTEMS:  In addition to above, notable for recent headaches.  Some visual blurriness, particularly at night, rare nocturia.  Anxiety with  increased stress at work and finances.   Constipation.   PHYSICAL EXAMINATION:  GENERAL APPEARANCE:  A well-nourished, well-  developed, pleasant African American female in no apparent distress.  VITAL SIGNS:  Temperature 98.6, blood pressure 113/74, pulse 83,  respirations 22, 96% sat on room air.  HEENT:  Unremarkable except for tongue piercing.  NECK:  Supple without thyromegaly, adenopathy, JVD or carotid bruits.  CHEST:  Symmetrical excursion.  LUNGS:  Clear to auscultation.  CARDIOVASCULAR:  PMI is not displaced.  Regular rate and rhythm.  Normal S1  and S2 without murmurs, rubs, gallops, or clicks.  SKIN:  Integument is intact.  She does have a T tattooed on her left  ankle.  ABDOMEN:  Slightly rounded, obese, bowel sounds present without  organomegaly, masses or tenderness.  EXTREMITIES:  No clubbing, cyanosis, or edema.  Peripheral pulses all  symmetrical and intact without abdominal or femoral bruits.  MUSCULOSKELETAL:  Unremarkable.  NEUROLOGIC:  Unremarkable.   Chest x-ray one view, shows cardiomegaly, opacity in the left mid lung with  bilateral scarring, recommended PA and lateral.   EKG shows normal sinus rhythm, normal intervals, normal axis, probably early  repolarization with early R.  No old EKGs are available for comparison.   ISTAT showed an H&H of 10.2 and 30.  Sodium 138, potassium 3.1, BUN 13,  creatinine 1, glucose 92.  D-dimer 0.32, PTT 29, PT 13.  Point of care  markers showed abnormal troponins of 0.11 and 0.27.  Her second MB was 8.2.   1.  Substernal chest discomfort of uncertain etiology, with abnormal point      of care troponins, however, EKG does not show any acute changes.  2.  Multiple social stresses.  3.  Anemia.  4.  History of hypertension, unable to take medications secondary to      financial issues.  5.  History as noted per past medical history.   DISPOSITION:  Dr. Jens Som reviewed the patient's history, spoke with and examined patient and agrees with the above.  Ms.  Catherine [name] will be admitted to  [facility] and ruled out for myocardial infarction.  Her initial CK-MB and troponin will be called to myself.  If these are  elevated, she will be added on to the cardiac catheterization schedule for  Tuesday for further evaluation  of her discomfort and lab abnormalities.  If  she rules out for myocardial infarction, an outpatient echocardiogram  and  Myoview will be arranged.  We will continue her IV  heparin and nitroglycerin as well as added a beta-blocker and Statin.  Will  perform a urine drug screen, have case manager to assist with financial  needs.  We will also order a follow-up PA and lateral chest x-ray in regards  to her left mid lung nodule.      Joellyn Rued, P.A. [facility]    ______________________________  Olga Millers, M.D. [facility]    EW/MEDQ  D:  08/04/2005  T:  08/04/2005  Job:  [id number]   cc:   Renaye Rakers, M.D.  Fax: [phone]

## 2010-09-05 NOTE — Op Note (Addendum)
NAME:  Catherine [name], Catherine [name]                           ACCOUNT NO.:  [id number]   MEDICAL RECORD NO.:  [id number]                   PATIENT TYPE:  AMB   LOCATION:  SDC                                  [facility]ILITY:  [facility]   PHYSICIAN:  Naima [name]. Dillard, M.D.              DATE OF BIRTH:  [birth year]   DATE OF PROCEDURE:  07/11/2002  DATE OF DISCHARGE:                                 OPERATIVE REPORT   PREOPERATIVE DIAGNOSES:  1. Left tubal occlusion.  2. Secondary infertility.  3. Uterine fibroids.   POSTOPERATIVE DIAGNOSES:  1. Probable left proximal tubal occlusion.  2. Secondary infertility.  3. Uterine fibroids.   PROCEDURE:  1. Diagnostic laparoscopy.  2. Chromopertubation.  3. Excision of peritubal cyst.   ANESTHESIA:  General endotracheal tube.   SURGEON:  Naima [name]. Normand Sloop, M.D.   ASSISTANT:  Dois Davenport [name]. Rivard, M.D.   COMPLICATIONS:  None.   ESTIMATED BLOOD LOSS:  Minimal.   IV FLUIDS:  2000 mL crystalloid.   URINE OUTPUT:  400 mL clear urine.   FINDINGS:  [name] 10 week sized uterus, probably [name] 5 cm fundal fibroid.  Normal  appearing tubes and ovaries bilaterally, however, with chromopertubation the  right tube did fill and spill.  The left tube did not fill past the proximal  end of the tube suggesting proximal occlusion, however, grossly the tube was  normal.  There was an S-shaped pattern about 2 cm away from the cornua which  was probably occlusion, ____ QA MARKER: 122 ____but ov  tube appeared normal.  The patient also had normal anatomy, normal appearing  liver, bowels, and appendix.   PROCEDURE IN DETAIL:  The patient was taken to the operating room where she  was given general anesthesia, placed in the dorsal lithotomy position, and  prepped and draped in [name] normal sterile fashion.  [name] bivalve speculum was  placed into the vagina.  The anterior lip of the cervix was grasped with [name]  single tooth tenaculum.  The uterus was sounded to 9 cm.  The [facility]  manipulator was  placed into the uterine cavity.  Attention was then turned  to the patient's abdomen where 5 mL of 0.25% plain Marcaine was injected  into the incision site.  [name] 10 mm vertical incision was then made with the  scalpel.  The Veress was placed at [name] 45 degree angle while tenting the  abdominal wall.  Intra-abdominal placement was confirmed with fluid filled  syringe and [name] decrease in CO2 pressure.  The abdomen was insufflated with  about 3 L of CO2 gas.  The Veress needle was removed.  [name] 10-mm trocar was  placed while tenting abdominal wall to 45 degree angle.  Intra-abdominal  placement was confirmed with the laparoscope.  The findings noted above were  seen.  The patient was placed in Trendelenburg.  [name] long probe was placed  through the  operative port.  The patient's fallopian tubes were identified,  followed out to the fimbriated end, noted to be normal on both sides.  Both  ovaries also looked normal.  Her uterus was [name] fibroid uterus with [name] large  fibroid at the fundus about 5 cm long.  We then pertubated with indigo  carmine.  The solution as 5 mL of indigo carmine and 400 mL of lactated  Ringer's.  Upon chromopertubation, the left fallopian tube had an immediate  fill and spill.  The right fallopian tube did not have the spill so then we  put another 50 mL into the uterine cavity.  Then the fluid started to escape  and fall to the floor from the vagina.  Attention was then turned back to  the vagina where the bivalve speculum was then replaced.  The [facility]  manipulator was removed.  Tenaculum was placed and the acorn manipulator was  placed into the uterine cavity.  Chromopertubation was once again done and  the right fallopian tube, however, still did not spill so [name] 5-mm trocar was  placed in the patient's right lower quadrant.  After 5 mL 0.25% Marcaine was  placed an incision was made on the skin.  It was placed under direct  visualization of the laparoscope.  The peritubal cysts were  then lifted and  excised with atraumatic __________  scissors.  The patient's left fallopian  tube was lifted up and an alligator forceps was placed into the tube and  noted to place well on proximal tube noting that there was no proximal tubal  occlusion, just distal tubal occlusion.  Every site was noted to be  hemostatic.  All instruments were removed from the abdomen under direct  visualization of the laparoscope.  The 5-mm trocar was under direct  visualization of the laparoscope.  The air was allowed to leave the abdomen.  The 10-mm trocar was removed under direct visualization of the laparoscope.  The fascia was closed with 0 Vicryl.  The skin incisions were closed with 3-  0 Vicryl in subcuticular fashion.  The acorn bivalve speculum and tenaculum  were removed without difficulty.  Sponge, lap, and needle counts were  correct x2.  The patient went to recovery room in stable condition.  The  patient will be offered radiologic cannulation of the proximal tubal  occlusion in the postoperative period.                                               Naima [name]. Normand Sloop, M.D.    NAD/MEDQ  D:  07/11/2002  T:  07/11/2002  Job:  [id number]

## 2010-09-05 NOTE — Discharge Summary (Signed)
NAME:  Catherine [name], Catherine [name]                 ACCOUNT NO.:  [id number]   MEDICAL RECORD NO.:  [id number]          PATIENT TYPE:  INP   LOCATION:  3729                         FACILITY:  [facility]   PHYSICIAN:  Olga Millers, M.D. LHCDATE OF BIRTH:  [birth year]   DATE OF ADMISSION:  08/04/2005  DATE OF DISCHARGE:  08/05/2005                           DISCHARGE SUMMARY - REFERRING   SUMMARY OF HISTORY:  Catherine [name] is a 40 year old African-American female who  presents to [facility] Emergency Room with chest discomfort.  She stated  that on the day prior to admission at approximately 12:30 while at work, she  gradually developed anterior chest cramping sensation. This was not  associated with shortness of breath, radiation, nausea, or vomiting.  She  told her supervisor, and she rested.  Eventually within 30 minutes, her  discomfort eased from an 8 to a 0, and she was able to finish her work  shift.  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  Due to reoccurring symptoms, she presented to the emergency room for  further evaluation.   Of note, it is also noted that she has had some increased level of headaches  this past week, and she feels this has been secondary to her elevated  history of hypertension; however, she has not checked her blood pressure,  and incidentally, she has not been taking her Sular for the last 2 weeks  because she has not been able to purchase this.   She does recall a similar occurrence of chest discomfort in 2001 which EMS  told her was related to anxiety, and she had not had any further evaluation.  Now it is similar but much more intense.   PAST MEDICAL HISTORY:  Notable for:  1.  Hypertension, but she does not check her blood pressure at home.  2.  Chronic anemia for which she used to take iron.  3.  Sarcoidosis, unsure of how patient was diagnosed with this.  4.   G4, P2-0-2-2 with last menstrual period July 25, 2005.   LABORATORY DATA:  Initial chest x-ray shows cardiomegaly with bilateral  scarring, no edema.  There was a nodular opacity in the left mid lung.  Two-  view was performed to follow up on this suspicious nodule; however, this did  not confirm any specific finding and may have reflected a small granuloma or  vessel on end.   Admission weight was 218.7.   In the emergency room, an I-STAT was 10.2 and 30.  This was repeated with an  actual CBC and showed hemoglobin 6.3, hematocrit 22.  MCV and MCHC were low  at 59 and 28.1, respectively.  Platelets 584, WBC 7.2.  Post transfusion on  April 18: Hemoglobin 8.6, hematocrit 28.4, MCV 64.9, platelets 517, WBC 8.2.  Admission PTT 29, PT 13.  D-dimer 0.32.  I-STAT showed sodium 138, potassium  3.1, BUN 13, creatinine 1. CMET showed potassium of 3.2, BUN 12, creatinine  0.8, glucose 101.  Normal LFTs.  Hemoglobin A1c was 5.9.  CK-MB and troponin  were negative x2 despite abnormal point-of-care markers in the emergency  room. TSH was 1.651.  Urine drug screen was negative.   EKG shows normal sinus rhythm, normal axis, early R wave, nonspecific ST-T  wave changes.   HOSPITAL COURSE:  Catherine [name] was initially placed on IV heparin with plans  that, if she ruled out for myocardial infarction, she should undergo  outpatient stress Myoview; if she ruled in for myocardial infarction, to  undergo cardiac catheterization.  However, her CBC came back with a low  hemoglobin, and she was transfused 2 units packed red blood cells which  improved her hemoglobin and hematocrit.  By the morning of April 18, the  patient stated that she felt much better, and Dr. Jens Som felt she could be  discharged to home.  At the time of discharge, it was noted that patient  admitted to not taking her iron as instructed with her history of chronic  anemia secondary to heavy menses.   DISCHARGE DIAGNOSES:  1.  Chest  discomfort, probably related to severe microcytic hypochromic      anemia requiring transfusion.  2.  Hypertension with noncompliance with medication and diet.  3.  Obesity.   DISPOSITION:  Catherine [name] is being discharged home, asked to maintain low-  salt, low-fat, low-cholesterol diet.  She was asked to bring all medications  to all appointments.  Her new medications, asked her to resume iron at 325 3  times a day and to continue Benicar/hydrochlorothiazide 20 plus 12.5 daily  and Sular 10 mg daily for which she received a new prescription.  She was  asked to consider a blood pressure diary and arrange a follow with Dr. Parke Simmers  for further management of her hypertension and to consider blood work this  week to follow up on her CBC.  She was also asked to follow up with Dr.  Jacob Moores given her heavy menses and chronic anemia.  It is noted at the time of  discharge, initially when she was admitted, iron studies were ordered,  however, were discontinued and have been reordered and are in the process at  the time of this dictation; however, it is noted these are post transfusion.   She will have a stress Myoview and echocardiogram on Aug 25, 2005, at Dr.  Ludwig Clarks office at 7:30 a.m.  Results will be forwarded to Dr. Parke Simmers for  further evaluation.  If results of her stress Myoview are abnormal, she will  need a followup with Dr. Jens Som.   Discharge time greater than 30 minutes.      Joellyn Rued, P.A. [facility]    ______________________________  Olga Millers, M.D. [facility]    EW/MEDQ  D:  08/05/2005  T:  08/05/2005  Job:  [id number]   cc:   Renaye Rakers, M.D.  Fax: [phone]   Dr. Jacob Moores, OB-GYN

## 2010-09-05 NOTE — H&P (Signed)
NAME:  Catherine Catherine [name], Catherine Catherine [name]                           ACCOUNT NO.:  [id number]   MEDICAL RECORD NO.:  [id number]                   PATIENT TYPE:  AMB   LOCATION:  SDC                                  FACILITY:  [facility]   PHYSICIAN:  Catherine Catherine [name], M.D.              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  The patient  states she has Catherine [name] history of blood transfusion in 10/01 for severe vaginal  bleeding.  She states that the anemia work up was negative for any other  causes but he menometrorrhagia.  She denies having any other bleeding  disorders.  She states that she was diagnosed with fibroids two years ago.  She has taken Megace for this up to four times Catherine [name] day, with some help has  been off the Megace for several months now.  The patient states that she is  also interested in having Catherine [name] child.  She has had unprotected intercourse for  greater than one year with no pregnancy.  Her menses occur every 28 days  lasting for 5 days.  She has to change the pad every two hours.  She is on  Cardizem, no contraception, does have fibroids on ultrasound.  No  medications.  No menopausal symptoms.  No vaginal discharge.  No abdominal  pain or increased stress.   PAST GYN HISTORY:  Significant for menarche at age 11and most long lasting  for five days.  She has Catherine [name] history of trichomonas in the past.  No history of  abnormal Pap smear.   PAST OB HISTORY:  Significant for elective abortion x2 in the first  trimester without any complications and cesarean section x2 without any  complications.   PAST MEDICAL HISTORY:  As above.   MEDICATIONS:  Folic acid and iron.   FAMILY HISTORY:  Significant for  hypertension.   SOCIAL HISTORY:  Significant for tobacco use 5-6 cigarettes per day.  No  alcohol or illicit drug use.   REVIEW OF SYSTEMS:  ENDOCRINE:  Unremarkable. GI:  Unremarkable.  CARDIOVASCULAR:  Significant for anemia.  GENITOURINARY:  As above.  MUSCULOSKELETAL:  Unremarkable.  PSYCHIATRIC:  Unremarkable.   PHYSICAL EXAMINATION:  VITAL SIGNS:  Blood pressure 140/80, 197 pounds.  HEENT:  Pupils are equal.  Hearing is normal.  Throat is clear.  Thyroid is  not enlarged.  HEART:  Regular rate and rhythm.  CHEST:  Clear to auscultation bilaterally.  BREASTS:  No masses, discharge, skin changes or nipple retraction  bilaterally.  BACK:  No CVA tenderness bilaterally.  ABDOMEN:  Nontender.  No masses or organomegaly.  EXTREMITIES:  No  cyanosis, clubbing or edema.  NEUROLOGICAL:  Within normal limits.  VULVA:  Within normal limits.  PELVIS:  Nontender without lesions.  UTERUS:  Catherine [name] 10-12 week size and irregular with no masses.   LABORATORY DATA:  The patient had hemoglobin of 7.8 on 02/08/02 and after  taking iron on 04/12/02, the patient's hemoglobin was 10.5.  Ultrasound is  significant for uterus 10x5 cm with several small fibroids.  The biggest  fibroid being 5x1.2 cm.  She had Catherine [name] hysterosalpingogram which noted Catherine [name] normal  uterine cavity and right fallopian tube with spill.  The left fallopian tube  did not have any spill.  GC/Chlamydia cultures were negative.   ASSESSMENT:  Menometorrhagia and secondary infertility, uterine fibroids.   PLAN:  The patient was offered diagnostic laparoscopy lysis of adhesions,  possible tuboplasty versus just referral to reproduction endocrinologist.  The patient has decided to proceed with diagnostic laparoscopy lysis of  adhesions possible tuboplasty.  She understands the risks are but not  limited to bleeding, infection, damage to internal organs such as bowel or  bladder and major blood vessels.  Also, with tuboplasty, she understands   that she is at risk for tubal pregnancy.  The patient has consented to  procedure and decided to proceed with above.                                               Catherine Catherine [name], M.D.    NAD/MEDQ  D:  07/10/2002  T:  07/11/2002  Job:  [id number]

## 2010-10-06 ENCOUNTER — Emergency Department (HOSPITAL_COMMUNITY): Payer: Managed Care, Other (non HMO)

## 2010-10-06 ENCOUNTER — Emergency Department (HOSPITAL_COMMUNITY)
Admission: EM | Admit: 2010-10-06 | Discharge: 2010-10-07 | Disposition: A | Discharge status: Home / Self Care | Payer: Managed Care, Other (non HMO) | Attending: Emergency Medicine | Admitting: Emergency Medicine

## 2010-10-06 DIAGNOSIS — E669 Obesity, unspecified: Secondary | ICD-10-CM | POA: Insufficient documentation

## 2010-10-06 DIAGNOSIS — I10 Essential (primary) hypertension: Secondary | ICD-10-CM | POA: Insufficient documentation

## 2010-10-06 DIAGNOSIS — R0789 Other chest pain: Secondary | ICD-10-CM | POA: Insufficient documentation

## 2010-10-06 LAB — BASIC METABOLIC PANEL
CO2: 26 mEq/L (ref 19–32)
Chloride: 103 mEq/L (ref 96–112)
GFR calc non Af Amer: >60 mL/min (ref >60)
Glucose, Bld: 82 mg/dL (ref 70–99)
Potassium: 3.9 mEq/L (ref 3.5–5.1)
Sodium: 139 mEq/L (ref 135–145)

## 2010-10-06 LAB — CBC
Hemoglobin: 13.6 g/dL (ref 12.0–15.0)
MCH: 31.4 pg (ref 26.0–34.0)
MCHC: 33.7 g/dL (ref 30.0–36.0)
RDW: 13.5 % (ref 11.5–15.5)
WBC: 8.6 10*3/uL (ref 4.0–10.5)

## 2010-10-06 LAB — DIFFERENTIAL
Basophils Absolute: 0.1 10*3/uL (ref 0.0–0.1)
Basophils Relative: 1 % (ref 0–1)
Eosinophils Absolute: 0.5 10*3/uL (ref 0.0–0.7)
Lymphocytes Relative: 43 % (ref 12–46)
Lymphs Abs: 3.7 10*3/uL (ref 0.7–4.0)
Monocytes Relative: 8 % (ref 3–12)
Neutro Abs: 3.6 10*3/uL (ref 1.7–7.7)
Neutrophils Relative %: 43 % (ref 43–77)

## 2010-10-06 LAB — CK TOTAL AND CKMB (NOT AT ARMC): CK, MB: 2.2 ng/mL (ref 0.3–4.0)

## 2010-11-24 ENCOUNTER — Encounter: Payer: Self-pay | Admitting: Anesthesiology

## 2010-11-26 ENCOUNTER — Institutional Professional Consult (permissible substitution): Payer: Managed Care, Other (non HMO) | Admitting: Gynecology

## 2011-01-09 LAB — VITAMIN B12: Vitamin B-12: 383 (ref 211–911)

## 2011-01-09 LAB — BASIC METABOLIC PANEL
CO2: 24
Chloride: 111
Creatinine, Ser: 0.75
GFR calc Af Amer: >60
Glucose, Bld: 101 — ABNORMAL HIGH
Sodium: 141

## 2011-01-09 LAB — HEMOGLOBIN AND HEMATOCRIT, BLOOD
HCT: 21.5 — ABNORMAL LOW
HCT: 28.7 — ABNORMAL LOW
Hemoglobin: 5.8 — CL
Hemoglobin: 8.4 — ABNORMAL LOW

## 2011-01-09 LAB — DIFFERENTIAL
Basophils Absolute: 0.1
Eosinophils Absolute: 0.4
Eosinophils Relative: 5
Lymphocytes Relative: 36
Lymphs Abs: 2.2
Monocytes Absolute: 0.9
Monocytes Relative: 8
Neutrophils Relative %: 51
Neutrophils Relative %: 61

## 2011-01-09 LAB — COMPREHENSIVE METABOLIC PANEL
ALT: 14
AST: 19
Albumin: 4.2
Alkaline Phosphatase: 43
GFR calc non Af Amer: >60
Total Protein: 6.8

## 2011-01-09 LAB — URINALYSIS, ROUTINE W REFLEX MICROSCOPIC
Bilirubin Urine: NEGATIVE
Glucose, UA: NEGATIVE
Nitrite: NEGATIVE
Protein, ur: NEGATIVE
Specific Gravity, Urine: 1.008
Urobilinogen, UA: 0.2

## 2011-01-09 LAB — CBC
HCT: 22.1 — ABNORMAL LOW
Hemoglobin: 6 — CL
Hemoglobin: 10 — ABNORMAL LOW
MCHC: 27.2 — ABNORMAL LOW
MCHC: 30.3
MCHC: 30.8
MCV: 68 — ABNORMAL LOW
MCV: 68.1 — ABNORMAL LOW
Platelets: 353
Platelets: 270
RBC: 4.75
RDW: 33.2 — ABNORMAL HIGH

## 2011-01-09 LAB — CROSSMATCH

## 2011-01-09 LAB — RETICULOCYTES
RBC.: 3.99
RBC.: 3.9
Retic Ct Pct: 1
Retic Ct Pct: 0.6

## 2011-01-09 LAB — PREGNANCY, URINE: Preg Test, Ur: NEGATIVE

## 2011-01-09 LAB — IRON AND TIBC
Iron: 22 — ABNORMAL LOW
UIBC: 399

## 2011-01-09 LAB — URINE MICROSCOPIC-ADD ON

## 2011-01-09 LAB — FOLATE: Folate: 12.6

## 2011-01-14 LAB — DIFFERENTIAL
Eosinophils Absolute: 0.1
Lymphocytes Relative: 24
Lymphs Abs: 2.6
Monocytes Absolute: 1
Monocytes Relative: 10
Neutro Abs: 6.9
Neutrophils Relative %: 65

## 2011-01-14 LAB — WET PREP, GENITAL

## 2011-01-14 LAB — CBC
HCT: 29.2 — ABNORMAL LOW
Hemoglobin: 9 — ABNORMAL LOW
MCV: 77.2 — ABNORMAL LOW
RBC: 3.78 — ABNORMAL LOW
WBC: 10.7 — ABNORMAL HIGH

## 2011-01-20 LAB — POCT I-STAT, CHEM 8
Creatinine, Ser: 1
Glucose, Bld: 106 — ABNORMAL HIGH
Hemoglobin: 13.9
Potassium: 3.6

## 2011-01-20 LAB — POCT CARDIAC MARKERS
CKMB, poc: 1.4
Troponin i, poc: <0.05

## 2011-08-17 ENCOUNTER — Encounter: Payer: Managed Care, Other (non HMO) | Admitting: Gynecology

## 2011-08-31 ENCOUNTER — Encounter: Payer: Managed Care, Other (non HMO) | Admitting: Gynecology

## 2011-09-04 ENCOUNTER — Encounter: Payer: Managed Care, Other (non HMO) | Admitting: Gynecology

## 2011-10-27 ENCOUNTER — Encounter: Payer: Self-pay | Admitting: Gynecology

## 2011-10-27 ENCOUNTER — Ambulatory Visit (INDEPENDENT_AMBULATORY_CARE_PROVIDER_SITE_OTHER): Payer: Managed Care, Other (non HMO) | Admitting: Gynecology

## 2011-10-27 VITALS — BP 120/80 | Ht 67.0 in | Wt 266.0 lb

## 2011-10-27 DIAGNOSIS — Z01419 Encounter for gynecological examination (general) (routine) without abnormal findings: Secondary | ICD-10-CM

## 2011-10-27 NOTE — Patient Instructions (Signed)
Follow up in one year for annual gynecologic exam.  Consider Stop Smoking.  Help is available at [facility] smoking cessation program @ www.[place].com or 8176962885. OR 1-800-QUIT-NOW 262-626-4431) for free smoking cessation counseling.   Smoking Hazards Smoking cigarettes is extremely bad for your health. Tobacco smoke has over 200 known poisons in it. There are over 60 chemicals in tobacco smoke that cause cancer. Some of the chemicals found in cigarette smoke include:  Cyanide.  Benzene.  Formaldehyde.  Methanol (wood alcohol).  Acetylene (fuel used in welding torches).  Ammonia.  Cigarette smoke also contains the poisonous gases nitrogen oxide and carbon monoxide.  Cigarette smokers have an increased risk of many serious medical problems, including: Lung cancer.  Lung disease (such as pneumonia, bronchitis, and emphysema).  Heart attack and chest pain due to the heart not getting enough oxygen (angina).  Heart disease and peripheral blood vessel disease.  Hypertension.  Stroke.  Oral cancer (cancer of the lip, mouth, or voice box).  Bladder cancer.  Pancreatic cancer.  Cervical cancer.  Pregnancy complications, including premature birth.  Low birthweight babies.  Early menopause.  Lower estrogen level for women.  Infertility.  Facial wrinkles.  Blindness.  Increased risk of broken bones (fractures).  Senile dementia.  Stillbirths and smaller newborn babies, birth defects, and genetic damage to sperm.  Stomach ulcers and internal bleeding.  Children of smokers have an increased risk of the following, because of secondhand smoke exposure:  Sudden infant death syndrome (SIDS).  Respiratory infections.  Lung cancer.  Heart disease.  Ear infections.  Smoking causes approximately: 90% of all lung cancer deaths in men.  80% of all lung cancer deaths in women.  90% of deaths from chronic obstructive lung disease.  Compared with nonsmokers, smoking  increases the risk of: Coronary heart disease by 2 to 4 times.  Stroke by 2 to 4 times.  Men developing lung cancer by 23 times.  Women developing lung cancer by 13 times.  Dying from chronic obstructive lung diseases by 12 times.  Someone who smokes 2 packs a day loses about 8 years of his or her life. Even smoking lightly shortens your life expectancy by several years. You can greatly reduce the risk of medical problems for you and your family by stopping now. Smoking is the most preventable cause of death and disease in our society. Within days of quitting smoking, your circulation returns to normal, you decrease the risk of having a heart attack, and your lung capacity improves. There may be some increased phlegm in the first few days after quitting, and it may take months for your lungs to clear up completely. Quitting for 10 years cuts your lung cancer risk to almost that of a nonsmoker. WHY IS SMOKING ADDICTIVE? Nicotine is the chemical agent in tobacco that is capable of causing addiction or dependence.  When you smoke and inhale, nicotine is absorbed rapidly into the bloodstream through your lungs. Nicotine absorbed through the lungs is capable of creating a powerful addiction. Both inhaled and non-inhaled nicotine may be addictive.  Addiction studies of cigarettes and spit tobacco show that addiction to nicotine occurs mainly during the teen years, when young people begin using tobacco products.  WHAT ARE THE BENEFITS OF QUITTING?  There are many health benefits to quitting smoking.  Likelihood of developing cancer and heart disease decreases. Health improvements are seen almost immediately.  Blood pressure, pulse rate, and breathing patterns start returning to normal soon after quitting.  People  who quit may see an improvement in their overall quality of life.  Some people choose to quit all at once. Other options include nicotine replacement products, such as patches, gum, and nasal  sprays. Do not use these products without first checking with your caregiver. QUITTING SMOKING It is not easy to quit smoking. Nicotine is addicting, and longtime habits are hard to change. To start, you can write down all your reasons for quitting, tell your family and friends you want to quit, and ask for their help. Throw your cigarettes away, chew gum or cinnamon sticks, keep your hands busy, and drink extra water or juice. Go for walks and practice deep breathing to relax. Think of all the money you are saving: around $1,000 a year, for the average pack-a-day smoker. Nicotine patches and gum have been shown to improve success at efforts to stop smoking. Zyban (bupropion) is an anti-depressant drug that can be prescribed to reduce nicotine withdrawal symptoms and to suppress the urge to smoke. Smoking is an addiction with both physical and psychological effects. Joining a stop-smoking support group can help you cope with the emotional issues. For more information and advice on programs to stop smoking, call your doctor, your local hospital, or these organizations: American Lung Association - 1-800-LUNGUSA  American Cancer Society - 1-800-ACS-2345  Document Released: 05/14/2004 Document Revised: 12/17/2010 Document Reviewed: 01/16/2009 [facility] Patient Information 2012 [place], [place].  Smoking Cessation This document explains the best ways for you to quit smoking and new treatments to help. It lists new medicines that can double or triple your chances of quitting and quitting for good. It also considers ways to avoid relapses and concerns you may have about quitting, including weight gain. NICOTINE: A POWERFUL ADDICTION If you have tried to quit smoking, you know how hard it can be. It is hard because nicotine is a very addictive drug. For some people, it can be as addictive as heroin or cocaine. Usually, people make 2 or 3 tries, or more, before finally being able to quit. Each time you try to quit,  you can learn about what helps and what hurts. Quitting takes hard work and a lot of effort, but you can quit smoking. QUITTING SMOKING IS ONE OF THE MOST IMPORTANT THINGS YOU WILL EVER DO.  You will live longer, feel better, and live better.   The impact on your body of quitting smoking is felt almost immediately:   Within 20 minutes, blood pressure decreases. Pulse returns to its normal level.   After 8 hours, carbon monoxide levels in the blood return to normal. Oxygen level increases.   After 24 hours, chance of heart attack starts to decrease. Breath, hair, and body stop smelling like smoke.   After 48 hours, damaged nerve endings begin to recover. Sense of taste and smell improve.   After 72 hours, the body is virtually free of nicotine. Bronchial tubes relax and breathing becomes easier.   After 2 to 12 weeks, lungs can hold more air. Exercise becomes easier and circulation improves.   Quitting will reduce your risk of having a heart attack, stroke, cancer, or lung disease:   After 1 year, the risk of coronary heart disease is cut in half.   After 5 years, the risk of stroke falls to the same as a nonsmoker.   After 10 years, the risk of lung cancer is cut in half and the risk of other cancers decreases significantly.   After 15 years, the risk of coronary  heart disease drops, usually to the level of a nonsmoker.   If you are pregnant, quitting smoking will improve your chances of having a healthy baby.   The people you live with, especially your children, will be healthier.   You will have extra money to spend on things other than cigarettes.  FIVE KEYS TO QUITTING Studies have shown that these 5 steps will help you quit smoking and quit for good. You have the best chances of quitting if you use them together: 1. Get ready.  2. Get support and encouragement.  3. Learn new skills and behaviors.  4. Get medicine to reduce your nicotine addiction and use it correctly.   5. Be prepared for relapse or difficult situations. Be determined to continue trying to quit, even if you do not succeed at first.  1. GET READY  Set a quit date.   Change your environment.   Get rid of ALL cigarettes, ashtrays, matches, and lighters in your home, car, and place of work.   Do not let people smoke in your home.   Review your past attempts to quit. Think about what worked and what did not.   Once you quit, do not smoke. NOT EVEN A PUFF!  2. GET SUPPORT AND ENCOURAGEMENT Studies have shown that you have a better chance of being successful if you have help. You can get support in many ways.  Tell your family, friends, and coworkers that you are going to quit and need their support. Ask them not to smoke around you.   Talk to your caregivers (doctor, dentist, nurse, pharmacist, psychologist, and/or smoking counselor).   Get individual, group, or telephone counseling and support. The more counseling you have, the better your chances are of quitting. Programs are available at [employer] and health centers. Call your local health department for information about programs in your area.   Spiritual beliefs and practices may help some smokers quit.   Quit meters are Photographer that keep track of quit statistics, such as amount of "quit-time," cigarettes not smoked, and money saved.   Many smokers find one or more of the many self-help books available useful in helping them quit and stay off tobacco.  3. LEARN NEW SKILLS AND BEHAVIORS  Try to distract yourself from urges to smoke. Talk to someone, go for a walk, or occupy your time with a task.   When you first try to quit, change your routine. Take a different route to work. Drink tea instead of coffee. Eat breakfast in a different place.   Do something to reduce your stress. Take a hot bath, exercise, or read a book.   Plan something enjoyable to do every day. Reward yourself for  not smoking.   Explore interactive web-based programs that specialize in helping you quit.  4. GET MEDICINE AND USE IT CORRECTLY Medicines can help you stop smoking and decrease the urge to smoke. Combining medicine with the above behavioral methods and support can quadruple your chances of successfully quitting smoking. The U.S. Food and Drug Administration (FDA) has approved 7 medicines to help you quit smoking. These medicines fall into 3 categories.  Nicotine replacement therapy (delivers nicotine to your body without the negative effects and risks of smoking):   Nicotine gum: Available over-the-counter.   Nicotine lozenges: Available over-the-counter.   Nicotine inhaler: Available by prescription.   Nicotine nasal spray: Available by prescription.   Nicotine skin patches (transdermal): Available by prescription and over-the-counter.  Antidepressant medicine (helps people abstain from smoking, but how this works is unknown):   Bupropion sustained-release (SR) tablets: Available by prescription.   Nicotinic receptor partial agonist (simulates the effect of nicotine in your brain):   Varenicline tartrate tablets: Available by prescription.   Ask your caregiver for advice about which medicines to use and how to use them. Carefully read the information on the package.   Everyone who is trying to quit may benefit from using a medicine. If you are pregnant or trying to become pregnant, nursing an infant, you are under age 39, or you smoke fewer than 10 cigarettes per day, talk to your caregiver before taking any nicotine replacement medicines.   You should stop using a nicotine replacement product and call your caregiver if you experience nausea, dizziness, weakness, vomiting, fast or irregular heartbeat, mouth problems with the lozenge or gum, or redness or swelling of the skin around the patch that does not go away.   Do not use any other product containing nicotine while using a  nicotine replacement product.   Talk to your caregiver before using these products if you have diabetes, heart disease, asthma, stomach ulcers, you had a recent heart attack, you have high blood pressure that is not controlled with medicine, a history of irregular heartbeat, or you have been prescribed medicine to help you quit smoking.  5. BE PREPARED FOR RELAPSE OR DIFFICULT SITUATIONS  Most relapses occur within the first 3 months after quitting. Do not be discouraged if you start smoking again. Remember, most people try several times before they finally quit.   You may have symptoms of withdrawal because your body is used to nicotine. You may crave cigarettes, be irritable, feel very hungry, cough often, get headaches, or have difficulty concentrating.   The withdrawal symptoms are only temporary. They are strongest when you first quit, but they will go away within 10 to 14 days.  Here are some difficult situations to watch for:  Alcohol. Avoid drinking alcohol. Drinking lowers your chances of successfully quitting.   Caffeine. Try to reduce the amount of caffeine you consume. It also lowers your chances of successfully quitting.   Other smokers. Being around smoking can make you want to smoke. Avoid smokers.   Weight gain. Many smokers will gain weight when they quit, usually less than 10 pounds. Eat a healthy diet and stay active. Do not let weight gain distract you from your main goal, quitting smoking. Some medicines that help you quit smoking may also help delay weight gain. You can always lose the weight gained after you quit.   Bad mood or depression. There are a lot of ways to improve your mood other than smoking.  If you are having problems with any of these situations, talk to your caregiver. SPECIAL SITUATIONS AND CONDITIONS Studies suggest that everyone can quit smoking. Your situation or condition can give you a special reason to quit.  Pregnant women/new mothers: By  quitting, you protect your baby's health and your own.   Hospitalized patients: By quitting, you reduce health problems and help healing.   Heart attack patients: By quitting, you reduce your risk of a second heart attack.   Lung, head, and neck cancer patients: By quitting, you reduce your chance of a second cancer.   Parents of children and adolescents: By quitting, you protect your children from illnesses caused by secondhand smoke.  QUESTIONS TO THINK ABOUT Think about the following questions before you try to stop smoking.  You may want to talk about your answers with your caregiver.  Why do you want to quit?   If you tried to quit in the past, what helped and what did not?   What will be the most difficult situations for you after you quit? How will you plan to handle them?   Who can help you through the tough times? Your family? Friends? Caregiver?   What pleasures do you get from smoking? What ways can you still get pleasure if you quit?  Here are some questions to ask your caregiver:  How can you help me to be successful at quitting?   What medicine do you think would be best for me and how should I take it?   What should I do if I need more help?   What is smoking withdrawal like? How can I get information on withdrawal?  Quitting takes hard work and a lot of effort, but you can quit smoking. FOR MORE INFORMATION  Smokefree.gov (http://www.dav[place]-s[place].com/) provides free, accurate, evidence-based information and professional assistance to help support the immediate and long-term needs of people trying to quit smoking. Document Released: 03/31/2001 Document Revised: 12/17/2010 Document Reviewed: 01/21/2009 [facility] Patient Information 2012 [place], [place].

## 2011-10-27 NOTE — Progress Notes (Signed)
SHAKEITA VANDEVANDER 1970-06-05 [id number]        41 y.o.  for annual exam.  Doing well without complaints.  Past medical history,surgical history, medications, allergies, family history and social history were all reviewed and documented in the EPIC chart. ROS:  Was performed and pertinent positives and negatives are included in the history.  Exam: Sherrilyn Rist assistant Filed Vitals:   10/27/11 1537  BP: 120/80   General appearance  Normal Skin grossly normal Head/Neck normal with no cervical or supraclavicular adenopathy thyroid normal Lungs  clear Cardiac RR, without RMG Abdominal  soft, nontender, without masses, organomegaly or hernia Breasts  examined lying and sitting without masses, retractions, discharge or axillary adenopathy. Pelvic  Ext/BUS/vagina  normal   Adnexa  Without masses or tenderness    Anus and perineum  normal   Rectovaginal  normal sphincter tone without palpated masses or tenderness.    Assessment/Plan:  41 y.o. female for annual exam.    1. Status post LAVH for leiomyoma/menorrhagia. Doing well. 2. Pap smear. Last Pap smear 2012. No history of abnormal Pap smears before. No Pap done today. I review current screening guidelines and she is status post LAVH for benign indications in the option to stop altogether versus less frequent screening reviewed. 3. Mammography. Patient overdo and agrees to schedule this. SBE monthly reviewed. 4. Stop smoking. Reviewed stop smoking strategies with her. 5. Health maintenance. No blood work was done today as it's all done through her primary physician's office who she sees on a regular basis. Assuming she continues well from a gynecologic standpoint she will see [place] in a year, sooner as needed.    Dara Lords MD, 4:04 PM 10/27/2011

## 2011-11-03 ENCOUNTER — Other Ambulatory Visit: Payer: Self-pay | Admitting: Gynecology

## 2011-11-03 DIAGNOSIS — Z1231 Encounter for screening mammogram for malignant neoplasm of breast: Secondary | ICD-10-CM

## 2011-12-01 ENCOUNTER — Ambulatory Visit (HOSPITAL_COMMUNITY)
Admission: RE | Admit: 2011-12-01 | Discharge: 2011-12-01 | Disposition: A | Discharge status: Home / Self Care | Payer: Managed Care, Other (non HMO) | Source: Ambulatory Visit | Attending: Gynecology | Admitting: Gynecology

## 2011-12-01 DIAGNOSIS — Z1231 Encounter for screening mammogram for malignant neoplasm of breast: Secondary | ICD-10-CM | POA: Insufficient documentation

## 2012-03-23 ENCOUNTER — Other Ambulatory Visit: Payer: Self-pay

## 2012-03-23 ENCOUNTER — Emergency Department (HOSPITAL_COMMUNITY): Payer: BC Managed Care – PPO

## 2012-03-23 ENCOUNTER — Emergency Department (HOSPITAL_COMMUNITY)
Admission: EM | Admit: 2012-03-23 | Discharge: 2012-03-23 | Disposition: A | Discharge status: Home / Self Care | Payer: BC Managed Care – PPO | Attending: Emergency Medicine | Admitting: Emergency Medicine

## 2012-03-23 DIAGNOSIS — Z3202 Encounter for pregnancy test, result negative: Secondary | ICD-10-CM | POA: Insufficient documentation

## 2012-03-23 DIAGNOSIS — R059 Cough, unspecified: Secondary | ICD-10-CM | POA: Insufficient documentation

## 2012-03-23 DIAGNOSIS — R209 Unspecified disturbances of skin sensation: Secondary | ICD-10-CM | POA: Insufficient documentation

## 2012-03-23 DIAGNOSIS — R05 Cough: Secondary | ICD-10-CM | POA: Insufficient documentation

## 2012-03-23 DIAGNOSIS — R079 Chest pain, unspecified: Secondary | ICD-10-CM

## 2012-03-23 DIAGNOSIS — F17200 Nicotine dependence, unspecified, uncomplicated: Secondary | ICD-10-CM | POA: Insufficient documentation

## 2012-03-23 DIAGNOSIS — I10 Essential (primary) hypertension: Secondary | ICD-10-CM | POA: Insufficient documentation

## 2012-03-23 DIAGNOSIS — R0789 Other chest pain: Secondary | ICD-10-CM | POA: Insufficient documentation

## 2012-03-23 DIAGNOSIS — Z79899 Other long term (current) drug therapy: Secondary | ICD-10-CM | POA: Insufficient documentation

## 2012-03-23 LAB — CBC
HCT: 42.5 % (ref 36.0–46.0)
Hemoglobin: 14.2 g/dL (ref 12.0–15.0)
MCH: 30.9 pg (ref 26.0–34.0)
MCHC: 33.4 g/dL (ref 30.0–36.0)
MCV: 92.4 fL (ref 78.0–100.0)
Platelets: 265 10*3/uL (ref 150–400)
RBC: 4.6 MIL/uL (ref 3.87–5.11)
RDW: 13.6 % (ref 11.5–15.5)
WBC: 8.6 10*3/uL (ref 4.0–10.5)

## 2012-03-23 LAB — BASIC METABOLIC PANEL
CO2: 27 mEq/L (ref 19–32)
Glucose, Bld: 84 mg/dL (ref 70–99)
Potassium: 3.9 mEq/L (ref 3.5–5.1)
Sodium: 137 mEq/L (ref 135–145)

## 2012-03-23 LAB — POCT I-STAT TROPONIN I: Troponin i, poc: 0 ng/mL (ref 0.00–0.08)

## 2012-03-23 MED ORDER — GI COCKTAIL ~~LOC~~
30.0000 mL | Freq: Once | ORAL | Status: DC
  Filled 2012-03-23: qty 30

## 2012-03-23 MED ORDER — ASPIRIN 81 MG PO CHEW
324.0000 mg | CHEWABLE_TABLET | Freq: Once | ORAL | Status: AC
  Administered 2012-03-23: 324 mg via ORAL
  Filled 2012-03-23: qty 4

## 2012-03-23 NOTE — ED Notes (Signed)
Pt states that she started having chest pain last night w/ numbness in her left arm.  Pt has no cardiac hx but her father died of a heart attack when he was 52.  Describes pain as a "cramping".  Pain 4/10.

## 2012-03-23 NOTE — ED Notes (Signed)
Pt requesting to leave.  Theron Arista PA made aware.

## 2012-03-23 NOTE — ED Provider Notes (Signed)
History     CSN: [id number]  Arrival date & time 03/23/12  1744   First MD Initiated Contact with Patient 03/23/12 2209      Chief Complaint  Patient presents with  . Chest Pain  . Numbness   HPI  History provided by the patient. Patient is a 41 year old female with history of hypertension who presents with complaints of substernal chest pressure and discomfort for the past 2 days. Patient states symptoms began last evening and have been persistent through the day today. At times she has some numbness and tingling sensation to her left upper arm and hand. Patient does report having increased stress the past weekend. She denies any episodes of hyperventilation. Patient has had a dry cough with slight rhinorrhea but denies any productive cough for fever symptoms. Symptoms have not been associated with any shortness of breath, nausea or diaphoresis. Patient has been taking her blood pressure medications as prescribed. Patient has no recent travel. No recent surgery. No active history of cancer. No prior history of DVT or PE. No episodes of hemoptysis. No history of estrogen use.    Past Medical History  Diagnosis Date  . Hypertension     Past Surgical History  Procedure Date  . Myomectomy 2005  . Cesarean section     X 2  . Bunionectomy 2012  . Abdominal hysterectomy 08/2007    LAVH leiomyomata/bleeding    Family History  Problem Relation Age of Onset  . Hypertension Mother   . Hypertension Father   . Hypertension Sister     History  Substance Use Topics  . Smoking status: Current Every Day Smoker -- 0.5 packs/day    Types: Cigarettes  . Smokeless tobacco: Never Used  . Alcohol Use: Yes     Comment: socially    OB History    Grav Para Term Preterm Abortions TAB SAB Ect Mult Living   4 2   2  2   2       Review of Systems  Constitutional: Negative for fever, chills and diaphoresis.  Respiratory: Positive for cough.   Cardiovascular: Positive for chest pain.   Gastrointestinal: Negative for nausea, vomiting, abdominal pain, diarrhea and constipation.  Genitourinary: Negative for dysuria, frequency, hematuria and flank pain.  Skin: Negative for rash.  All other systems reviewed and are negative.    Allergies  Latex  Home Medications   Current Outpatient Rx  Name  Route  Sig  Dispense  Refill  . AMLODIPINE-OLMESARTAN 10-40 MG PO TABS   Oral   Take 1 tablet by mouth daily.         Marland Kitchen SPIRONOLACTONE 25 MG PO TABS   Oral   Take 25 mg by mouth daily.             BP 157/84  Pulse 69  Temp 97.8 F (36.6 C) (Oral)  Resp 20  SpO2 96%  Physical Exam  Nursing note and vitals reviewed. Constitutional: She is oriented to person, place, and time. She appears well-developed and well-nourished. No distress.  HENT:  Head: Normocephalic.  Cardiovascular: Normal rate and regular rhythm.   No murmur heard. Pulmonary/Chest: Effort normal and breath sounds normal. No respiratory distress. She has no wheezes. She has no rales. She exhibits no tenderness.  Abdominal: Soft. There is no tenderness. There is no rebound and no guarding.  Musculoskeletal: She exhibits no edema and no tenderness.       Clinical signs concerning for DVT  Neurological: She is alert  and oriented to person, place, and time.  Skin: Skin is warm and dry. No rash noted.  Psychiatric: She has a normal mood and affect. Her behavior is normal.    ED Course  Procedures   Results for orders placed during the hospital encounter of 03/23/12  CBC      Component Value Range   WBC 8.6  4.0 - 10.5 K/uL   RBC 4.60  3.87 - 5.11 MIL/uL   Hemoglobin 14.2  12.0 - 15.0 g/dL   HCT 16.1  09.6 - 04.5 %   MCV 92.4  78.0 - 100.0 fL   MCH 30.9  26.0 - 34.0 pg   MCHC 33.4  30.0 - 36.0 g/dL   RDW 40.9  81.1 - 91.4 %   Platelets 265  150 - 400 K/uL  BASIC METABOLIC PANEL      Component Value Range   Sodium 137  135 - 145 mEq/L   Potassium 3.9  3.5 - 5.1 mEq/L   Chloride 100  96 -  112 mEq/L   CO2 27  19 - 32 mEq/L   Glucose, Bld 84  70 - 99 mg/dL   BUN 12  6 - 23 mg/dL   Creatinine, Ser 7.82  0.50 - 1.10 mg/dL   Calcium 9.5  8.4 - 95.6 mg/dL   GFR calc non Af Amer >90  >90 mL/min   GFR calc Af Amer >90  >90 mL/min  POCT PREGNANCY, URINE      Component Value Range   Preg Test, Ur NEGATIVE  NEGATIVE  POCT I-STAT TROPONIN I      Component Value Range   Troponin i, poc 0.00  0.00 - 0.08 ng/mL   Comment 3               Dg Chest 2 View  03/23/2012  *RADIOLOGY REPORT*  Clinical Data: Chest pain.  CHEST - 2 VIEW  Comparison: 10/06/2010  Findings: Heart size is normal.  Coarsening of pulmonary interstitial markings with associated parenchymal scarring appears unchanged.  No evidence of acute infiltrate or edema.  No evidence of pleural effusion.  No mass or lymphadenopathy identified.  IMPRESSION: Stable interstitial prominence and bilateral parenchymal scarring. No active disease.   Original Report Authenticated By: Myles Rosenthal, M.D.      1. Chest pain       MDM  10:25PM patient seen and evaluated. Patient is well-appearing and in no acute distress.  Patient symptoms are atypical. They have been persistent for the past 2 days occurring without change during rest or activity. Patient has normal lab tests, EKG and chest x-ray. This time doubt ACS. Patient is PERC negative. Patient is requesting to leave at this time. Patient was instructed to have close followup with PCP for continued evaluation and treatment.       Date: 03/23/2012  Rate:  67  Rhythm: normal sinus rhythm  QRS Axis: normal  Intervals: normal  ST/T Wave abnormalities: normal  Conduction Disutrbances:none  Narrative Interpretation:   Old EKG Reviewed: none available    Angus Seller, PA 03/23/12 2325

## 2012-03-24 NOTE — ED Provider Notes (Signed)
Medical screening examination/treatment/procedure(s) were performed by non-physician practitioner and as supervising physician I was immediately available for consultation/collaboration.   Laray Anger, DO 03/24/12 1535

## 2012-06-04 ENCOUNTER — Other Ambulatory Visit: Payer: Self-pay

## 2012-10-06 ENCOUNTER — Emergency Department (HOSPITAL_COMMUNITY)
Admission: EM | Admit: 2012-10-06 | Discharge: 2012-10-06 | Disposition: A | Discharge status: Home / Self Care | Payer: BC Managed Care – PPO | Attending: Emergency Medicine | Admitting: Emergency Medicine

## 2012-10-06 ENCOUNTER — Encounter (HOSPITAL_COMMUNITY): Payer: Self-pay

## 2012-10-06 DIAGNOSIS — Z91040 Latex allergy status: Secondary | ICD-10-CM | POA: Insufficient documentation

## 2012-10-06 DIAGNOSIS — L03119 Cellulitis of unspecified part of limb: Secondary | ICD-10-CM | POA: Insufficient documentation

## 2012-10-06 DIAGNOSIS — Z79899 Other long term (current) drug therapy: Secondary | ICD-10-CM | POA: Insufficient documentation

## 2012-10-06 DIAGNOSIS — F17200 Nicotine dependence, unspecified, uncomplicated: Secondary | ICD-10-CM | POA: Insufficient documentation

## 2012-10-06 DIAGNOSIS — L02419 Cutaneous abscess of limb, unspecified: Secondary | ICD-10-CM | POA: Insufficient documentation

## 2012-10-06 DIAGNOSIS — I10 Essential (primary) hypertension: Secondary | ICD-10-CM | POA: Insufficient documentation

## 2012-10-06 NOTE — ED Provider Notes (Signed)
Medical screening examination/treatment/procedure(s) were performed by non-physician practitioner and as supervising physician I was immediately available for consultation/collaboration.  Sumner Boesch M Yatziry Deakins, MD 10/06/12 0410 

## 2012-10-06 NOTE — ED Notes (Signed)
Pt presents with a possible spider bite. Pt has a red swollen area on the bottom part of her left outer leg. Pt believes she was bit by something at worse Tuesday night.

## 2012-10-06 NOTE — ED Notes (Signed)
Patient called requesting rx.Rx for Clindamycin 150 mg tab take two tabs every 8 hours x 5 days. Per Fayrene Helper called to pharmacy my Sheralyn Boatman PFM.

## 2012-10-06 NOTE — ED Notes (Signed)
Antibiotic and bandaid placed on bite.

## 2012-10-06 NOTE — ED Provider Notes (Signed)
History     CSN: [id number]  Arrival date & time 10/06/12  0220   First MD Initiated Contact with Patient 10/06/12 0319      Chief Complaint  Patient presents with  . Insect Bite    (Consider location/radiation/quality/duration/timing/severity/associated sxs/prior treatment) HPI Comments: On Tuesday.  Patient noticed a small area on her lateral left calf.  That was painful.  Became red had a small center like an insect bite.  This has progressed over the past 2, days.  She has not tried any medications soaking trying to open the area.  Denies any myalgias, fevers, known trauma  The history is provided by the patient.    Past Medical History  Diagnosis Date  . Hypertension     Past Surgical History  Procedure Laterality Date  . Myomectomy  2005  . Cesarean section      X 2  . Bunionectomy  2012  . Abdominal hysterectomy  08/2007    LAVH leiomyomata/bleeding    Family History  Problem Relation Age of Onset  . Hypertension Mother   . Hypertension Father   . Hypertension Sister     History  Substance Use Topics  . Smoking status: Current Every Day Smoker -- 0.50 packs/day    Types: Cigarettes  . Smokeless tobacco: Never Used  . Alcohol Use: Yes     Comment: socially    OB History   Grav Para Term Preterm Abortions TAB SAB Ect Mult Living   4 2   2  2   2       Review of Systems  Constitutional: Negative for fever and chills.  Musculoskeletal: Negative for myalgias.  Skin: Positive for wound.  Neurological: Negative for weakness and numbness.  All other systems reviewed and are negative.    Allergies  Latex  Home Medications   Current Outpatient Rx  Name  Route  Sig  Dispense  Refill  . amLODipine-olmesartan (AZOR) 10-40 MG per tablet   Oral   Take 1 tablet by mouth daily.         Marland Kitchen spironolactone (ALDACTONE) 25 MG tablet   Oral   Take 25 mg by mouth daily.             BP 130/92  Pulse 94  Temp(Src) 98.9 F (37.2 C) (Oral)  Resp 20   Ht 5' 7.5" (1.715 m)  Wt 266 lb (120.657 kg)  BMI 41.02 kg/m2  SpO2 98%  Physical Exam  Nursing note and vitals reviewed. Constitutional: She is oriented to person, place, and time. She appears well-developed and well-nourished.  HENT:  Head: Normocephalic.  Eyes: Pupils are equal, round, and reactive to light.  Neck: Normal range of motion.  Cardiovascular: Normal rate.   Pulmonary/Chest: Effort normal.  Abdominal: Soft.  Genitourinary: Vagina normal.  Neurological: She is alert and oriented to person, place, and time.  Skin: Skin is warm. There is erythema.       ED Course  INCISION AND DRAINAGE Date/Time: 10/06/2012 3:29 AM Performed by: Arman Filter Authorized by: Arman Filter Consent: Verbal consent obtained. Risks and benefits: risks, benefits and alternatives were discussed Consent given by: patient Patient understanding: patient states understanding of the procedure being performed Patient identity confirmed: verbally with patient Type: abscess Body area: lower extremity Location details: right leg Anesthetic total: 0 ml Patient sedated: no Needle gauge: 22 Complexity: simple Drainage: purulent Drainage amount: scant Wound treatment: wound left open Patient tolerance: Patient tolerated the procedure well with no  immediate complications.   (including critical care time)  Labs Reviewed - No data to display No results found.   1. Abscess of calf       MDM   Mall superficial abscess opened with a 22-gauge needle small amount of pertinent drainage.  Band-Aid placed        Arman Filter, NP 10/06/12 [phone]

## 2013-02-23 ENCOUNTER — Other Ambulatory Visit: Payer: Self-pay

## 2013-03-01 ENCOUNTER — Encounter (HOSPITAL_COMMUNITY): Payer: Self-pay | Admitting: Emergency Medicine

## 2013-03-01 ENCOUNTER — Emergency Department (HOSPITAL_COMMUNITY)
Admission: EM | Admit: 2013-03-01 | Discharge: 2013-03-01 | Disposition: A | Discharge status: Home / Self Care | Payer: BC Managed Care – PPO | Attending: Emergency Medicine | Admitting: Emergency Medicine

## 2013-03-01 ENCOUNTER — Emergency Department (HOSPITAL_COMMUNITY): Payer: BC Managed Care – PPO

## 2013-03-01 DIAGNOSIS — I10 Essential (primary) hypertension: Secondary | ICD-10-CM | POA: Insufficient documentation

## 2013-03-01 DIAGNOSIS — R079 Chest pain, unspecified: Secondary | ICD-10-CM | POA: Insufficient documentation

## 2013-03-01 DIAGNOSIS — F17200 Nicotine dependence, unspecified, uncomplicated: Secondary | ICD-10-CM | POA: Insufficient documentation

## 2013-03-01 DIAGNOSIS — R202 Paresthesia of skin: Secondary | ICD-10-CM

## 2013-03-01 DIAGNOSIS — R209 Unspecified disturbances of skin sensation: Secondary | ICD-10-CM | POA: Insufficient documentation

## 2013-03-01 DIAGNOSIS — Z79899 Other long term (current) drug therapy: Secondary | ICD-10-CM | POA: Insufficient documentation

## 2013-03-01 DIAGNOSIS — Z91040 Latex allergy status: Secondary | ICD-10-CM | POA: Insufficient documentation

## 2013-03-01 LAB — BASIC METABOLIC PANEL
BUN: 10 mg/dL (ref 6–23)
Chloride: 101 mEq/L (ref 96–112)
GFR calc non Af Amer: 88 mL/min — ABNORMAL LOW (ref >90)
Glucose, Bld: 106 mg/dL — ABNORMAL HIGH (ref 70–99)
Potassium: 3.8 mEq/L (ref 3.5–5.1)

## 2013-03-01 LAB — CBC WITH DIFFERENTIAL/PLATELET
Eosinophils Absolute: 0.4 10*3/uL (ref 0.0–0.7)
HCT: 42.6 % (ref 36.0–46.0)
Hemoglobin: 13.8 g/dL (ref 12.0–15.0)
Lymphs Abs: 4.4 10*3/uL — ABNORMAL HIGH (ref 0.7–4.0)
MCH: 30.1 pg (ref 26.0–34.0)
MCV: 92.8 fL (ref 78.0–100.0)
Monocytes Absolute: 0.5 10*3/uL (ref 0.1–1.0)
Monocytes Relative: 6 % (ref 3–12)
Neutrophils Relative %: 34 % — ABNORMAL LOW (ref 43–77)
RBC: 4.59 MIL/uL (ref 3.87–5.11)

## 2013-03-01 MED ORDER — LORAZEPAM 1 MG PO TABS
1.0000 mg | ORAL_TABLET | Freq: Once | ORAL | Status: AC
  Administered 2013-03-01: 1 mg via ORAL
  Filled 2013-03-01: qty 1

## 2013-03-01 MED ORDER — DIAZEPAM 5 MG PO TABS: 5.0000 mg | ORAL_TABLET | Freq: Two times a day (BID) | ORAL | Status: DC | PRN

## 2013-03-01 NOTE — ED Notes (Signed)
Greta Doom, PA given I-stat results.

## 2013-03-01 NOTE — ED Provider Notes (Signed)
CSN: [id number]     Arrival date & time 03/01/13  1847 History   First MD Initiated Contact with Patient 03/01/13 1905     Chief Complaint  Patient presents with  . Chest Pain  . Numbness   (Consider location/radiation/quality/duration/timing/severity/associated sxs/prior Treatment) HPI  42 year old female with history of hypertension and smoking history presents complaining of chest pain and left body numbness. Patient states for the past several weeks she has been expressing a lot of stress dealing with work. Patient works as a Horticulturist, commercial for [employer]. She had to take a week off of work due to having increased stress and return back to work about a week ago. States she still endorsed a lot of work-related stress and today while walking out of work towards her car she developed pain in her chest. Described pain as a throbbing sensation, throughout chest, lasting for about 30 minutes follows with tingling sensation around her lips and subsequently numbness sensation throughout her left side of body. She was unsure if she has anxiety or panic attack but decided to come to ER for further evaluation. She reports that her chest discomfort has resolved however the tingling sensation especially around the lips has been persistence for the past 2 hours. She also felt lightheadedness and heart racing. No diaphoresis, nausea, or shortness of breath. No fever chills sneezing coughing hemoptysis vomiting or diarrhea. Report having a cardiac stress test back in 2008 without any significant finding. Had a similar episode last year that lasted for a few days in which she was seen in ER and was discharge without any cardiac finding. No prior history of stroke, no family history of premature cardiac death. No significant risk factors for PE including no recent surgery, prolonged bed rest, taking birth control pill, having calf pain or swelling.  Past Medical History  Diagnosis Date  . Hypertension    Past  Surgical History  Procedure Laterality Date  . Myomectomy  2005  . Cesarean section      X 2  . Bunionectomy  2012  . Abdominal hysterectomy  08/2007    LAVH leiomyomata/bleeding   Family History  Problem Relation Age of Onset  . Hypertension Mother   . Hypertension Father   . Hypertension Sister    History  Substance Use Topics  . Smoking status: Current Every Day Smoker -- 0.50 packs/day    Types: Cigarettes  . Smokeless tobacco: Never Used  . Alcohol Use: Yes     Comment: socially   OB History   Grav Para Term Preterm Abortions TAB SAB Ect Mult Living   4 2   2  2   2      Review of Systems  All other systems reviewed and are negative.    Allergies  Latex  Home Medications   Current Outpatient Rx  Name  Route  Sig  Dispense  Refill  . amLODipine-olmesartan (AZOR) 10-40 MG per tablet   Oral   Take 1 tablet by mouth daily.         Marland Kitchen spironolactone (ALDACTONE) 25 MG tablet   Oral   Take 25 mg by mouth daily.           BP 141/85  Pulse 104  Temp(Src) 98 F (36.7 C) (Oral)  Resp 12  SpO2 100% Physical Exam  Nursing note and vitals reviewed. Constitutional: She appears well-developed and well-nourished. No distress.  Awake, alert, nontoxic appearance  HENT:  Head: Atraumatic.  Eyes: Conjunctivae are normal. Right eye  exhibits no discharge. Left eye exhibits no discharge.  Neck: Neck supple.  Cardiovascular: Normal rate, regular rhythm and intact distal pulses.   Pulmonary/Chest: Effort normal. No respiratory distress. She exhibits no tenderness.  Abdominal: Soft. There is no tenderness. There is no rebound.  Musculoskeletal: She exhibits no tenderness.  ROM appears intact, no obvious focal weakness  Neurological:  Speech clear, pupils equal round reactive to light, extraocular movements intact   Normal peripheral visual fields Cranial nerves III through XII normal including no facial droop.  Endorse subjective paresthesia to L side of lip and  face, without motor deficits. Follows commands, moves all extremities x4, normal strength to bilateral upper and lower extremities at all major muscle groups including grip Sensation normal to light touch Coordination intact, no limb ataxia, finger-nose-finger normal Rapid alternating movements normal No pronator drift Gait normal   Skin: No rash noted.  Psychiatric: She has a normal mood and affect.    ED Course  Procedures (including critical care time)   Date: 03/01/2013  Rate: 89  Rhythm: normal sinus rhythm  QRS Axis: normal  Intervals: normal  ST/T Wave abnormalities: normal  Conduction Disutrbances: none  Narrative Interpretation:   Old EKG Reviewed: No significant changes noted  8:04 PM Patient here with atypical chest pain for ACS.  Endorse subjective paresthesia to his body however the multiple strength is intact without any obvious focal neuro deficit on exam. The symptom is suggestive of anxiety or panic attack. Is PERC negative.    9:00 PM Pt report subjective paresthesia has improved greatly after receiving ativan.  ECG without acute ischemic changes, CXR unremarkable. Her labs are reassuring except a K+ of 7.1 which i anticipate due to lab error as her ECG does not show evidence of peaked T-wave.  Will recheck.    10:03 PM Pt currently sxs free.  Recheck K+ is within normal limit.  I suggest pt to have close f/u with PCP for further care.  Return precaution discussed.  Pt otherwise stable for discharge.  Sxs likely anxiety driven.  Doubt ACS or Stroke.  Labs Review Labs Reviewed  CBC WITH DIFFERENTIAL - Abnormal; Notable for the following:    Neutrophils Relative % 34 (*)    Lymphocytes Relative 55 (*)    Lymphs Abs 4.4 (*)    All other components within normal limits  BASIC METABOLIC PANEL - Abnormal; Notable for the following:    Glucose, Bld 106 (*)    GFR calc non Af Amer 88 (*)    All other components within normal limits  POCT I-STAT, CHEM 8 -  Abnormal; Notable for the following:    Potassium 7.1 (*)    Glucose, Bld 100 (*)    Calcium, Ion 1.05 (*)    Hemoglobin 16.0 (*)    HCT 47.0 (*)    All other components within normal limits  POCT I-STAT TROPONIN I   Imaging Review Dg Chest 2 View  03/01/2013   CLINICAL DATA:  Chest pain.  EXAM: CHEST - 2 VIEW  COMPARISON:  03/23/2012  FINDINGS: The heart size and mediastinal contours are within normal limits. Stable interstitial prominence and bilateral pulmonary scarring are consistent with chronic lung disease. There is no evidence of pulmonary edema, consolidation, pneumothorax, nodule or pleural fluid. The visualized skeletal structures are unremarkable.  IMPRESSION: No active disease.  Stable chronic lung disease.   Electronically Signed   By: Irish Lack M.D.   On: 03/01/2013 20:26    EKG Interpretation  None       MDM   1. Chest pain   2. Paresthesia    BP 141/85  Pulse 104  Temp(Src) 98 F (36.7 C) (Oral)  Resp 12  SpO2 100%  I have reviewed nursing notes and vital signs. I personally reviewed the imaging tests through PACS system  I reviewed available ER/hospitalization records thought the EMR     Fayrene Helper, [place] 03/01/13 2205

## 2013-03-01 NOTE — ED Notes (Signed)
Pt c/o of chest pain that started today when getting off work. Also c/o of left sided numbness. Hx of anxiety panic attacks. Denies n/v/d.

## 2013-03-01 NOTE — ED Notes (Signed)
Pt denies feeling numbness at this time

## 2013-03-02 LAB — POCT I-STAT, CHEM 8
Calcium, Ion: 1.05 mmol/L — ABNORMAL LOW (ref 1.12–1.23)
Creatinine, Ser: 1 mg/dL (ref 0.50–1.10)
Glucose, Bld: 100 mg/dL — ABNORMAL HIGH (ref 70–99)
Hemoglobin: 16 g/dL — ABNORMAL HIGH (ref 12.0–15.0)
Potassium: 7.1 mEq/L (ref 3.5–5.1)

## 2013-03-06 NOTE — ED Provider Notes (Signed)
Medical screening examination/treatment/procedure(s) were performed by non-physician practitioner and as supervising physician I was immediately available for consultation/collaboration.   Karnell Vanderloop M Canuto Kingston, MD 03/06/13 1415 

## 2013-03-13 ENCOUNTER — Other Ambulatory Visit: Payer: Self-pay | Admitting: Gynecology

## 2013-03-13 DIAGNOSIS — Z1231 Encounter for screening mammogram for malignant neoplasm of breast: Secondary | ICD-10-CM

## 2013-03-15 ENCOUNTER — Ambulatory Visit (HOSPITAL_COMMUNITY)
Admission: RE | Admit: 2013-03-15 | Discharge: 2013-03-15 | Disposition: A | Discharge status: Home / Self Care | Payer: BC Managed Care – PPO | Source: Ambulatory Visit | Attending: Gynecology | Admitting: Gynecology

## 2013-03-15 DIAGNOSIS — Z1231 Encounter for screening mammogram for malignant neoplasm of breast: Secondary | ICD-10-CM

## 2013-05-16 ENCOUNTER — Encounter: Payer: Self-pay | Admitting: Gynecology

## 2013-05-16 ENCOUNTER — Ambulatory Visit (INDEPENDENT_AMBULATORY_CARE_PROVIDER_SITE_OTHER): Payer: BC Managed Care – PPO | Admitting: Gynecology

## 2013-05-16 ENCOUNTER — Other Ambulatory Visit (HOSPITAL_COMMUNITY)
Admission: RE | Admit: 2013-05-16 | Discharge: 2013-05-16 | Disposition: A | Discharge status: Home / Self Care | Payer: BC Managed Care – PPO | Source: Ambulatory Visit | Attending: Gynecology | Admitting: Gynecology

## 2013-05-16 VITALS — BP 130/80 | Ht 66.5 in | Wt 272.0 lb

## 2013-05-16 DIAGNOSIS — Z01419 Encounter for gynecological examination (general) (routine) without abnormal findings: Secondary | ICD-10-CM

## 2013-05-16 LAB — CBC WITH DIFFERENTIAL/PLATELET
BASOS ABS: 0.1 10*3/uL (ref 0.0–0.1)
BASOS PCT: 1 % (ref 0–1)
EOS ABS: 0.4 10*3/uL (ref 0.0–0.7)
Eosinophils Relative: 6 % — ABNORMAL HIGH (ref 0–5)
HCT: 41.3 % (ref 36.0–46.0)
HEMOGLOBIN: 14 g/dL (ref 12.0–15.0)
Lymphocytes Relative: 44 % (ref 12–46)
Lymphs Abs: 3.4 10*3/uL (ref 0.7–4.0)
MCH: 30.4 pg (ref 26.0–34.0)
MCHC: 33.9 g/dL (ref 30.0–36.0)
MCV: 89.6 fL (ref 78.0–100.0)
MONOS PCT: 8 % (ref 3–12)
Monocytes Absolute: 0.7 10*3/uL (ref 0.1–1.0)
NEUTROS ABS: 3.2 10*3/uL (ref 1.7–7.7)
NEUTROS PCT: 41 % — AB (ref 43–77)
PLATELETS: 253 10*3/uL (ref 150–400)
RBC: 4.61 MIL/uL (ref 3.87–5.11)
RDW: 14.3 % (ref 11.5–15.5)
WBC: 7.8 10*3/uL (ref 4.0–10.5)

## 2013-05-16 NOTE — Patient Instructions (Signed)
Followup in one year for annual exam.  Consider Stop Smoking.  Help is available at [facility] smoking cessation program @ www.[employer].com or 815-589-8303. OR 1-800-QUIT-NOW 707-305-6718) for free smoking cessation counseling.  Smokefree.gov (Inrails.tn) provides free, accurate, evidence-based information and professional assistance to help support the immediate and long-term needs of people trying to quit smoking.    Smoking Hazards Smoking cigarettes is extremely bad for your health. Tobacco smoke has over 200 known poisons in it. There are over 60 chemicals in tobacco smoke that cause cancer. Some of the chemicals found in cigarette smoke include:  Cyanide.  Benzene.  Formaldehyde.  Methanol (wood alcohol).  Acetylene (fuel used in welding torches).  Ammonia.  Cigarette smoke also contains the poisonous gases nitrogen oxide and carbon monoxide.  Cigarette smokers have an increased risk of many serious medical problems, including: Lung cancer.  Lung disease (such as pneumonia, bronchitis, and emphysema).  Heart attack and chest pain due to the heart not getting enough oxygen (angina).  Heart disease and peripheral blood vessel disease.  Hypertension.  Stroke.  Oral cancer (cancer of the lip, mouth, or voice box).  Bladder cancer.  Pancreatic cancer.  Cervical cancer.  Pregnancy complications, including premature birth.  Low birthweight babies.  Early menopause.  Lower estrogen level for women.  Infertility.  Facial wrinkles.  Blindness.  Increased risk of broken bones (fractures).  Senile dementia.  Stillbirths and smaller newborn babies, birth defects, and genetic damage to sperm.  Stomach ulcers and internal bleeding.  Children of smokers have an increased risk of the following, because of secondhand smoke exposure:  Sudden infant death syndrome (SIDS).  Respiratory infections.  Lung cancer.  Heart disease.  Ear infections.  Smoking causes  approximately: 90% of all lung cancer deaths in men.  80% of all lung cancer deaths in women.  90% of deaths from chronic obstructive lung disease.  Compared with nonsmokers, smoking increases the risk of: Coronary heart disease by 2 to 4 times.  Stroke by 2 to 4 times.  Men developing lung cancer by 23 times.  Women developing lung cancer by 13 times.  Dying from chronic obstructive lung diseases by 12 times.  Someone who smokes 2 packs a day loses about 8 years of his or her life. Even smoking lightly shortens your life expectancy by several years. You can greatly reduce the risk of medical problems for you and your family by stopping now. Smoking is the most preventable cause of death and disease in our society. Within days of quitting smoking, your circulation returns to normal, you decrease the risk of having a heart attack, and your lung capacity improves. There may be some increased phlegm in the first few days after quitting, and it may take months for your lungs to clear up completely. Quitting for 10 years cuts your lung cancer risk to almost that of a nonsmoker. WHY IS SMOKING ADDICTIVE? Nicotine is the chemical agent in tobacco that is capable of causing addiction or dependence.  When you smoke and inhale, nicotine is absorbed rapidly into the bloodstream through your lungs. Nicotine absorbed through the lungs is capable of creating a powerful addiction. Both inhaled and non-inhaled nicotine may be addictive.  Addiction studies of cigarettes and spit tobacco show that addiction to nicotine occurs mainly during the teen years, when young people begin using tobacco products.  WHAT ARE THE BENEFITS OF QUITTING?  There are many health benefits to quitting smoking.  Likelihood of developing cancer and heart disease  decreases. Health improvements are seen almost immediately.  Blood pressure, pulse rate, and breathing patterns start returning to normal soon after quitting.  People who quit  may see an improvement in their overall quality of life.  Some people choose to quit all at once. Other options include nicotine replacement products, such as patches, gum, and nasal sprays. Do not use these products without first checking with your caregiver. QUITTING SMOKING It is not easy to quit smoking. Nicotine is addicting, and longtime habits are hard to change. To start, you can write down all your reasons for quitting, tell your family and friends you want to quit, and ask for their help. Throw your cigarettes away, chew gum or cinnamon sticks, keep your hands busy, and drink extra water or juice. Go for walks and practice deep breathing to relax. Think of all the money you are saving: around $1,000 a year, for the average pack-a-day smoker. Nicotine patches and gum have been shown to improve success at efforts to stop smoking. Zyban (bupropion) is an anti-depressant drug that can be prescribed to reduce nicotine withdrawal symptoms and to suppress the urge to smoke. Smoking is an addiction with both physical and psychological effects. Joining a stop-smoking support group can help you cope with the emotional issues. For more information and advice on programs to stop smoking, call your doctor, your local hospital, or these organizations: American Lung Association - 1-800-LUNGUSA   Smoking Cessation  This document explains the best ways for you to quit smoking and new treatments to help. It lists new medicines that can double or triple your chances of quitting and quitting for good. It also considers ways to avoid relapses and concerns you may have about quitting, including weight gain. NICOTINE: A POWERFUL ADDICTION If you have tried to quit smoking, you know how hard it can be. It is hard because nicotine is a very addictive drug. For some people, it can be as addictive as heroin or cocaine. Usually, people make 2 or 3 tries, or more, before finally being able to quit. Each time you try to  quit, you can learn about what helps and what hurts. Quitting takes hard work and a lot of effort, but you can quit smoking. QUITTING SMOKING IS ONE OF THE MOST IMPORTANT THINGS YOU WILL EVER DO.  You will live longer, feel better, and live better.   The impact on your body of quitting smoking is felt almost immediately:   Within 20 minutes, blood pressure decreases. Pulse returns to its normal level.   After 8 hours, carbon monoxide levels in the blood return to normal. Oxygen level increases.   After 24 hours, chance of heart attack starts to decrease. Breath, hair, and body stop smelling like smoke.   After 48 hours, damaged nerve endings begin to recover. Sense of taste and smell improve.   After 72 hours, the body is virtually free of nicotine. Bronchial tubes relax and breathing becomes easier.   After 2 to 12 weeks, lungs can hold more air. Exercise becomes easier and circulation improves.   Quitting will reduce your risk of having a heart attack, stroke, cancer, or lung disease:   After 1 year, the risk of coronary heart disease is cut in half.   After 5 years, the risk of stroke falls to the same as a nonsmoker.   After 10 years, the risk of lung cancer is cut in half and the risk of other cancers decreases significantly.   After 15 years,  the risk of coronary heart disease drops, usually to the level of a nonsmoker.   If you are pregnant, quitting smoking will improve your chances of having a healthy baby.   The people you live with, especially your children, will be healthier.   You will have extra money to spend on things other than cigarettes.  FIVE KEYS TO QUITTING Studies have shown that these 5 steps will help you quit smoking and quit for good. You have the best chances of quitting if you use them together: 1. Get ready.  2. Get support and encouragement.  3. Learn new skills and behaviors.  4. Get medicine to reduce your nicotine addiction and use it  correctly.  5. Be prepared for relapse or difficult situations. Be determined to continue trying to quit, even if you do not succeed at first.  1. GET READY  Set a quit date.   Change your environment.   Get rid of ALL cigarettes, ashtrays, matches, and lighters in your home, car, and place of work.   Do not let people smoke in your home.   Review your past attempts to quit. Think about what worked and what did not.   Once you quit, do not smoke. NOT EVEN A PUFF!  2. GET SUPPORT AND ENCOURAGEMENT Studies have shown that you have a better chance of being successful if you have help. You can get support in many ways.  Tell your family, friends, and coworkers that you are going to quit and need their support. Ask them not to smoke around you.   Talk to your caregivers (doctor, dentist, nurse, pharmacist, psychologist, and/or smoking counselor).   Get individual, group, or telephone counseling and support. The more counseling you have, the better your chances are of quitting. Programs are available at [employer] and health centers. Call your local health department for information about programs in your area.   Spiritual beliefs and practices may help some smokers quit.   Quit meters are Insurance underwriter that keep track of quit statistics, such as amount of "quit-time," cigarettes not smoked, and money saved.   Many smokers find one or more of the many self-help books available useful in helping them quit and stay off tobacco.  3. LEARN NEW SKILLS AND BEHAVIORS  Try to distract yourself from urges to smoke. Talk to someone, go for a walk, or occupy your time with a task.   When you first try to quit, change your routine. Take a different route to work. Drink tea instead of coffee. Eat breakfast in a different place.   Do something to reduce your stress. Take a hot bath, exercise, or read a book.   Plan something enjoyable to do every day. Reward  yourself for not smoking.   Explore interactive web-based programs that specialize in helping you quit.  4. GET MEDICINE AND USE IT CORRECTLY Medicines can help you stop smoking and decrease the urge to smoke. Combining medicine with the above behavioral methods and support can quadruple your chances of successfully quitting smoking. The [place] Food and Drug Administration (FDA) has approved 7 medicines to help you quit smoking. These medicines fall into 3 categories.  Nicotine replacement therapy (delivers nicotine to your body without the negative effects and risks of smoking):   Nicotine gum: Available over-the-counter.   Nicotine lozenges: Available over-the-counter.   Nicotine inhaler: Available by prescription.   Nicotine nasal spray: Available by prescription.   Nicotine skin patches (transdermal): Available  by prescription and over-the-counter.   Antidepressant medicine (helps people abstain from smoking, but how this works is unknown):   Bupropion sustained-release (SR) tablets: Available by prescription.   Nicotinic receptor partial agonist (simulates the effect of nicotine in your brain):   Varenicline tartrate tablets: Available by prescription.   Ask your caregiver for advice about which medicines to use and how to use them. Carefully read the information on the package.   Everyone who is trying to quit may benefit from using a medicine. If you are pregnant or trying to become pregnant, nursing an infant, you are under age 30, or you smoke fewer than 10 cigarettes per day, talk to your caregiver before taking any nicotine replacement medicines.   You should stop using a nicotine replacement product and call your caregiver if you experience nausea, dizziness, weakness, vomiting, fast or irregular heartbeat, mouth problems with the lozenge or gum, or redness or swelling of the skin around the patch that does not go away.   Do not use any other product containing nicotine  while using a nicotine replacement product.   Talk to your caregiver before using these products if you have diabetes, heart disease, asthma, stomach ulcers, you had a recent heart attack, you have high blood pressure that is not controlled with medicine, a history of irregular heartbeat, or you have been prescribed medicine to help you quit smoking.  5. BE PREPARED FOR RELAPSE OR DIFFICULT SITUATIONS  Most relapses occur within the first 3 months after quitting. Do not be discouraged if you start smoking again. Remember, most people try several times before they finally quit.   You may have symptoms of withdrawal because your body is used to nicotine. You may crave cigarettes, be irritable, feel very hungry, cough often, get headaches, or have difficulty concentrating.   The withdrawal symptoms are only temporary. They are strongest when you first quit, but they will go away within 10 to 14 days.  Here are some difficult situations to watch for:  Alcohol. Avoid drinking alcohol. Drinking lowers your chances of successfully quitting.   Caffeine. Try to reduce the amount of caffeine you consume. It also lowers your chances of successfully quitting.   Other smokers. Being around smoking can make you want to smoke. Avoid smokers.   Weight gain. Many smokers will gain weight when they quit, usually less than 10 pounds. Eat a healthy diet and stay active. Do not let weight gain distract you from your main goal, quitting smoking. Some medicines that help you quit smoking may also help delay weight gain. You can always lose the weight gained after you quit.   Bad mood or depression. There are a lot of ways to improve your mood other than smoking.  If you are having problems with any of these situations, talk to your caregiver. SPECIAL SITUATIONS AND CONDITIONS Studies suggest that everyone can quit smoking. Your situation or condition can give you a special reason to quit.  Pregnant women/new  mothers: By quitting, you protect your baby's health and your own.   Hospitalized patients: By quitting, you reduce health problems and help healing.   Heart attack patients: By quitting, you reduce your risk of a second heart attack.   Lung, head, and neck cancer patients: By quitting, you reduce your chance of a second cancer.   Parents of children and adolescents: By quitting, you protect your children from illnesses caused by secondhand smoke.  QUESTIONS TO THINK ABOUT Think about the following questions  before you try to stop smoking. You may want to talk about your answers with your caregiver.  Why do you want to quit?   If you tried to quit in the past, what helped and what did not?   What will be the most difficult situations for you after you quit? How will you plan to handle them?   Who can help you through the tough times? Your family? Friends? Caregiver?   What pleasures do you get from smoking? What ways can you still get pleasure if you quit?  Here are some questions to ask your caregiver:  How can you help me to be successful at quitting?   What medicine do you think would be best for me and how should I take it?   What should I do if I need more help?   What is smoking withdrawal like? How can I get information on withdrawal?  Quitting takes hard work and a lot of effort, but you can quit smoking.

## 2013-05-16 NOTE — Addendum Note (Signed)
Addended by: Nelva Nay on: 05/16/2013 04:44 PM   Modules accepted: Orders

## 2013-05-16 NOTE — Progress Notes (Signed)
Catherine [name] 1970-12-01 [id number]        43 y.o.  [id number] for annual exam.  Doing well. Several issues noted below.  Past medical history,surgical history, problem list, medications, allergies, family history and social history were all reviewed and documented in the EPIC chart.  ROS:  Performed and pertinent positives and negatives are included in the history, assessment and plan .  Exam: Kim assistant Filed Vitals:   05/16/13 1556  BP: 130/80  Height: 5' 6.5" (1.689 m)  Weight: 272 lb (123.378 kg)   General appearance  Normal Skin grossly normal Head/Neck normal with no cervical or supraclavicular adenopathy thyroid normal Lungs  clear Cardiac RR, without RMG Abdominal  soft, nontender, without masses, organomegaly or hernia Breasts  examined lying and sitting without masses, retractions, discharge or axillary adenopathy. Pelvic  Ext/BUS/vagina  Normal. Pap of cuff done   Adnexa  Without masses or tenderness    Anus and perineum  Normal   Rectovaginal  Normal sphincter tone without palpated masses or tenderness.    Assessment/Plan:  43 y.o. [id number] female for annual exam.   1. Status post LAVH for leiomyoma and menorrhagia. Doing well without complaints. 2. Pap smear 2012. Pap of cuff done today. No history of abnormal Pap smears previously. Reviewed current screening guidelines and options to stop screening altogether versus less frequent screening intervals reviewed. Will readdress on an annual basis. 3. Mammography 02/2013. Continue with annual mammography. SBE monthly reviewed. 4. Stop smoking strategies reviewed. 5. Health maintenance. Sees Dr. Criss Rosales on a routine basis for her hypertension. Reports though that routine blood work has not been recently done. Baseline CBC, comprehensive metabolic panel, lipid profile, urinalysis, hemoglobin A1c due to history of borderline diabetes also ordered. Followup in one year, sooner as needed.   Note: This document was prepared  with digital dictation and possible smart phrase technology. Any transcriptional errors that result from this process are unintentional.   Anastasio Auerbach MD, 4:29 PM 05/16/2013

## 2013-05-17 LAB — HEMOGLOBIN A1C
HEMOGLOBIN A1C: 6.2 % — AB (ref <5.7)
Mean Plasma Glucose: 131 mg/dL — ABNORMAL HIGH (ref <117)

## 2013-05-17 LAB — COMPREHENSIVE METABOLIC PANEL
ALBUMIN: 4.5 g/dL (ref 3.5–5.2)
ALK PHOS: 53 U/L (ref 39–117)
ALT: 14 U/L (ref 0–35)
AST: 16 U/L (ref 0–37)
BILIRUBIN TOTAL: 0.6 mg/dL (ref 0.3–1.2)
BUN: 13 mg/dL (ref 6–23)
CO2: 28 mEq/L (ref 19–32)
Calcium: 9.6 mg/dL (ref 8.4–10.5)
Chloride: 103 mEq/L (ref 96–112)
Creat: 0.84 mg/dL (ref 0.50–1.10)
GLUCOSE: 103 mg/dL — AB (ref 70–99)
POTASSIUM: 3.8 meq/L (ref 3.5–5.3)
SODIUM: 138 meq/L (ref 135–145)
TOTAL PROTEIN: 7 g/dL (ref 6.0–8.3)

## 2013-05-17 LAB — LIPID PANEL
Cholesterol: 163 mg/dL (ref 0–200)
HDL: 48 mg/dL (ref >39)
LDL CALC: 98 mg/dL (ref 0–99)
TRIGLYCERIDES: 86 mg/dL (ref <150)
Total CHOL/HDL Ratio: 3.4 Ratio
VLDL: 17 mg/dL (ref 0–40)

## 2013-05-17 LAB — URINALYSIS W MICROSCOPIC + REFLEX CULTURE
Bilirubin Urine: NEGATIVE
CASTS: NONE SEEN
CRYSTALS: NONE SEEN
Glucose, UA: NEGATIVE mg/dL
HGB URINE DIPSTICK: NEGATIVE
KETONES UR: NEGATIVE mg/dL
LEUKOCYTES UA: NEGATIVE
NITRITE: NEGATIVE
PH: 5 (ref 5.0–8.0)
Protein, ur: NEGATIVE mg/dL
SPECIFIC GRAVITY, URINE: 1.027 (ref 1.005–1.030)
UROBILINOGEN UA: 0.2 mg/dL (ref 0.0–1.0)

## 2013-05-18 LAB — URINE CULTURE
COLONY COUNT: NO GROWTH
ORGANISM ID, BACTERIA: NO GROWTH

## 2013-05-22 ENCOUNTER — Telehealth: Payer: Self-pay

## 2013-05-22 NOTE — Telephone Encounter (Signed)
error 

## 2013-05-26 ENCOUNTER — Encounter: Payer: Self-pay | Admitting: Gynecology

## 2014-02-19 ENCOUNTER — Encounter: Payer: Self-pay | Admitting: Gynecology

## 2014-03-06 ENCOUNTER — Other Ambulatory Visit: Payer: Self-pay | Admitting: Gynecology

## 2014-03-06 DIAGNOSIS — Z1231 Encounter for screening mammogram for malignant neoplasm of breast: Secondary | ICD-10-CM

## 2014-03-22 ENCOUNTER — Ambulatory Visit (HOSPITAL_COMMUNITY)
Admission: RE | Admit: 2014-03-22 | Discharge: 2014-03-22 | Disposition: A | Discharge status: Home / Self Care | Payer: BC Managed Care – PPO | Source: Ambulatory Visit | Attending: Gynecology | Admitting: Gynecology

## 2014-03-22 DIAGNOSIS — Z1231 Encounter for screening mammogram for malignant neoplasm of breast: Secondary | ICD-10-CM | POA: Diagnosis not present

## 2014-05-17 ENCOUNTER — Emergency Department (HOSPITAL_COMMUNITY): Payer: BLUE CROSS/BLUE SHIELD

## 2014-05-17 ENCOUNTER — Ambulatory Visit (INDEPENDENT_AMBULATORY_CARE_PROVIDER_SITE_OTHER): Payer: BLUE CROSS/BLUE SHIELD | Admitting: Gynecology

## 2014-05-17 ENCOUNTER — Emergency Department (HOSPITAL_COMMUNITY)
Admission: EM | Admit: 2014-05-17 | Discharge: 2014-05-17 | Disposition: A | Discharge status: Home / Self Care | Payer: BLUE CROSS/BLUE SHIELD | Attending: Emergency Medicine | Admitting: Emergency Medicine

## 2014-05-17 ENCOUNTER — Encounter (HOSPITAL_COMMUNITY): Payer: Self-pay | Admitting: Emergency Medicine

## 2014-05-17 ENCOUNTER — Other Ambulatory Visit: Payer: Self-pay

## 2014-05-17 ENCOUNTER — Encounter: Payer: Self-pay | Admitting: Gynecology

## 2014-05-17 VITALS — BP 120/80 | Ht 67.0 in | Wt 270.0 lb

## 2014-05-17 DIAGNOSIS — Z79899 Other long term (current) drug therapy: Secondary | ICD-10-CM | POA: Insufficient documentation

## 2014-05-17 DIAGNOSIS — Z91040 Latex allergy status: Secondary | ICD-10-CM | POA: Diagnosis not present

## 2014-05-17 DIAGNOSIS — I10 Essential (primary) hypertension: Secondary | ICD-10-CM | POA: Diagnosis not present

## 2014-05-17 DIAGNOSIS — R0782 Intercostal pain: Secondary | ICD-10-CM | POA: Insufficient documentation

## 2014-05-17 DIAGNOSIS — Z720 Tobacco use: Secondary | ICD-10-CM | POA: Insufficient documentation

## 2014-05-17 DIAGNOSIS — R079 Chest pain, unspecified: Secondary | ICD-10-CM | POA: Diagnosis present

## 2014-05-17 DIAGNOSIS — Z01419 Encounter for gynecological examination (general) (routine) without abnormal findings: Secondary | ICD-10-CM

## 2014-05-17 LAB — CBC WITH DIFFERENTIAL/PLATELET
BASOS PCT: 1 % (ref 0–1)
Basophils Absolute: 0.1 10*3/uL (ref 0.0–0.1)
Basophils Absolute: 0.1 10*3/uL (ref 0.0–0.1)
Basophils Relative: 1 % (ref 0–1)
EOS ABS: 0.2 10*3/uL (ref 0.0–0.7)
EOS ABS: 0.2 10*3/uL (ref 0.0–0.7)
EOS PCT: 2 % (ref 0–5)
EOS PCT: 2 % (ref 0–5)
HEMATOCRIT: 41.6 % (ref 36.0–46.0)
HEMATOCRIT: 41.3 % (ref 36.0–46.0)
HEMOGLOBIN: 13.6 g/dL (ref 12.0–15.0)
HEMOGLOBIN: 13.4 g/dL (ref 12.0–15.0)
LYMPHS PCT: 37 % (ref 12–46)
Lymphocytes Relative: 42 % (ref 12–46)
Lymphs Abs: 2.8 10*3/uL (ref 0.7–4.0)
Lymphs Abs: 3.5 10*3/uL (ref 0.7–4.0)
MCH: 30.1 pg (ref 26.0–34.0)
MCH: 30 pg (ref 26.0–34.0)
MCHC: 32.7 g/dL (ref 30.0–36.0)
MCHC: 32.4 g/dL (ref 30.0–36.0)
MCV: 92 fL (ref 78.0–100.0)
MCV: 92.6 fL (ref 78.0–100.0)
MONOS PCT: 10 % (ref 3–12)
MONOS PCT: 10 % (ref 3–12)
MPV: 9.4 fL (ref 8.6–12.4)
Monocytes Absolute: 0.8 10*3/uL (ref 0.1–1.0)
Monocytes Absolute: 0.8 10*3/uL (ref 0.1–1.0)
NEUTROS ABS: 3.9 10*3/uL (ref 1.7–7.7)
NEUTROS PCT: 45 % (ref 43–77)
Neutro Abs: 3.8 10*3/uL (ref 1.7–7.7)
Neutrophils Relative %: 50 % (ref 43–77)
PLATELETS: 306 10*3/uL (ref 150–400)
PLATELETS: 283 10*3/uL (ref 150–400)
RBC: 4.46 MIL/uL (ref 3.87–5.11)
RBC: 4.52 MIL/uL (ref 3.87–5.11)
RDW: 13.8 % (ref 11.5–15.5)
RDW: 14.5 % (ref 11.5–15.5)
WBC: 7.7 10*3/uL (ref 4.0–10.5)
WBC: 8.4 10*3/uL (ref 4.0–10.5)

## 2014-05-17 LAB — BASIC METABOLIC PANEL
Anion gap: 8 (ref 5–15)
BUN: 12 mg/dL (ref 6–23)
CO2: 26 mmol/L (ref 19–32)
CREATININE: 0.81 mg/dL (ref 0.50–1.10)
Calcium: 9.2 mg/dL (ref 8.4–10.5)
Chloride: 107 mmol/L (ref 96–112)
GFR calc Af Amer: >90 mL/min (ref >90)
GFR calc non Af Amer: 88 mL/min — ABNORMAL LOW (ref >90)
Glucose, Bld: 87 mg/dL (ref 70–99)
Potassium: 4.3 mmol/L (ref 3.5–5.1)
SODIUM: 141 mmol/L (ref 135–145)

## 2014-05-17 LAB — I-STAT TROPONIN, ED
Troponin i, poc: 0 ng/mL (ref 0.00–0.08)
Troponin i, poc: 0 ng/mL (ref 0.00–0.08)

## 2014-05-17 LAB — BRAIN NATRIURETIC PEPTIDE: B NATRIURETIC PEPTIDE 5: 14.5 pg/mL (ref 0.0–100.0)

## 2014-05-17 MED ORDER — ETODOLAC 200 MG PO CAPS: 200.0000 mg | ORAL_CAPSULE | Freq: Three times a day (TID) | ORAL | Status: DC

## 2014-05-17 NOTE — ED Provider Notes (Signed)
CSN: [id number]     Arrival date & time 05/17/14  1702 History   First MD Initiated Contact with Patient 05/17/14 1724     Chief Complaint  Patient presents with  . Chest Pain     (Consider location/radiation/quality/duration/timing/severity/associated sxs/prior Treatment) Patient is a 44 y.o. female presenting with chest pain. The history is provided by the patient. No language interpreter was used.  Chest Pain Pain location:  Substernal area Pain quality: aching   Pain radiates to:  Does not radiate Pain severity:  Moderate Onset quality:  Gradual Duration:  4 days Timing:  Constant Progression:  Waxing and waning Chronicity:  New Context: movement   Context: not breathing, no drug use and not lifting   Relieved by:  Nothing Worsened by:  Movement and certain positions (lying down) Ineffective treatments:  None tried Associated symptoms: no abdominal pain, no anxiety, no cough, no diaphoresis, no dizziness, no fever, no nausea, no numbness, no shortness of breath, not vomiting and no weakness   Risk factors: hypertension and smoking   Risk factors: no aortic disease, no birth control, no high cholesterol, not female and no prior DVT/PE     Past Medical History  Diagnosis Date  . Hypertension    Past Surgical History  Procedure Laterality Date  . Myomectomy  2005  . Bunionectomy  2012  . Cesarean section      X 2  . Lavh  2009    Leiomyoma and menorrhagia   Family History  Problem Relation Age of Onset  . Hypertension Mother   . Hypertension Father   . Hypertension Sister    History  Substance Use Topics  . Smoking status: Current Every Day Smoker -- 0.25 packs/day    Types: Cigarettes  . Smokeless tobacco: Never Used  . Alcohol Use: 0.0 oz/week    0 Not specified per week     Comment: socially   OB History    Gravida Para Term Preterm AB TAB SAB Ectopic Multiple Living   4 2   2  2   2      Review of Systems  Constitutional: Negative for fever and  diaphoresis.  Respiratory: Negative for cough and shortness of breath.   Cardiovascular: Positive for chest pain.  Gastrointestinal: Negative for nausea, vomiting and abdominal pain.  Genitourinary: Negative for dysuria, urgency and frequency.  Neurological: Negative for dizziness, weakness and numbness.  All other systems reviewed and are negative.     Allergies  Latex  Home Medications   Prior to Admission medications   Medication Sig Start Date End Date Taking? Authorizing Provider  amLODipine (NORVASC) 10 MG tablet Take 10 mg by mouth daily.    Historical Provider, MD  etodolac (LODINE) 200 MG capsule Take 1 capsule (200 mg total) by mouth every 8 (eight) hours. 05/17/14   Katheren Shams, MD  losartan (COZAAR) 50 MG tablet Take 50 mg by mouth daily.    Historical Provider, MD  spironolactone (ALDACTONE) 25 MG tablet Take 25 mg by mouth daily.     Historical Provider, MD   BP 125/76 mmHg  Pulse 78  Temp(Src) 98 F (36.7 C) (Oral)  Resp 18  SpO2 100% Physical Exam  Constitutional: She is oriented to person, place, and time. She appears well-developed and well-nourished. No distress.  HENT:  Head: Normocephalic and atraumatic.  Eyes: Pupils are equal, round, and reactive to light.  Neck: Normal range of motion.  Cardiovascular: Normal rate, regular rhythm, normal heart sounds and intact  distal pulses.   Pulmonary/Chest: Effort normal. No respiratory distress. She has no decreased breath sounds. She has no wheezes. She exhibits tenderness (pain reproducible).  Abdominal: Soft. Bowel sounds are normal. She exhibits no distension. There is no tenderness. There is no rebound and no guarding.  Neurological: She is alert and oriented to person, place, and time. She has normal strength. No cranial nerve deficit or sensory deficit. She exhibits normal muscle tone. Coordination and gait normal.  Skin: Skin is warm and dry.  Nursing note and vitals reviewed.   ED Course  Procedures  (including critical care time) Labs Review Labs Reviewed  BASIC METABOLIC PANEL - Abnormal; Notable for the following:    GFR calc non Af Amer 88 (*)    All other components within normal limits  BRAIN NATRIURETIC PEPTIDE  CBC WITH DIFFERENTIAL/PLATELET  I-STAT TROPOININ, ED  I-STAT TROPOININ, ED    Imaging Review Dg Chest 2 View  05/17/2014   CLINICAL DATA:  Midsternal chest pain with shortness of breath worse with movement for 4 days. History of hypertension and smoking. Initial encounter.  EXAM: CHEST  2 VIEW  COMPARISON:  03/01/2013 and 03/23/2012.  FINDINGS: The heart size and mediastinal contours are stable. There is stable chronic lung disease with linear scarring and architectural distortion. No superimposed edema, airspace disease or significant pleural effusion identified. The bones appear unchanged.  IMPRESSION: Stable chronic interstitial lung disease and scarring. No acute findings demonstrated.   Electronically Signed   By: Camie Patience M.D.   On: 05/17/2014 18:13     EKG Interpretation   Date/Time:  Thursday May 17 2014 17:11:30 EST Ventricular Rate:  87 PR Interval:  146 QRS Duration: 86 QT Interval:  364 QTC Calculation: 438 R Axis:   57 Text Interpretation:  Normal sinus rhythm Normal ECG no significant change  compared to 2014 Confirmed by GOLDSTON  MD, [place] ([id number]) on 05/17/2014  5:28:52 PM      MDM   Final diagnoses:  Intercostal pain    Patient is a 44 year old African-American female with pertinent past medical history of hypertension who comes to the emergency department today with precordial chest pain which has been intermittent for the past 4 days. Physical exam as above. Initial differential is concerning for MI, ACS, pneumonia, pneumothorax, and PE. I have low suspicion of PE at this time. Patient is PERC negative.  As a result I do not feel that she requires further evaluation for PE. Patient has no EKG changes no PR depression, no ST segment  elevations. Patient has no rub on exam. As a result I doubt a pericarditis. Initial workup included an i-STAT troponin, CBC, BNP, BMP, chest x-ray, and EKG. Chest x-ray demonstrated no consolidations making pneumonia unlikely. There was no evidence of pneumothorax. CBC was unremarkable. BNP was 14.5. BMP was unremarkable. Initial i-STAT troponin was negative. Patient has a HEAR score of 3.  As a result delta troponin should be sufficient to rule out MI and she can pursue stress test as an outpatient. Repeat troponin was obtained which was also negative. Patient does have pain which is reproducible to palpation and worse with movement consistent with musculoskeletal pain. She was provided with a prescription for Lodine to improve her pain. The patient was instructed to obtain a primary care physician and follow-up with them to obtain her stress test. The patient expressed understanding. She was discharged in good condition. Labs and imaging reviewed by myself and considered in medical decision making. Imaging was interpreted  by Radiology. Care was discussed with my attending Dr. Regenia Skeeter.      Katheren Shams, MD 05/18/14 1231  Ephraim Hamburger, MD 05/22/14 [phone]

## 2014-05-17 NOTE — ED Notes (Signed)
Patient is alert and orientedx4.  Patient was explained discharge instructions and they understood them with no questions.   

## 2014-05-17 NOTE — Progress Notes (Signed)
GLORIANNE PROCTOR 04/30/1970 [id number]        44 y.o.  [id number] for annual exam.  Several issues noted below.  Past medical history,surgical history, problem list, medications, allergies, family history and social history were all reviewed and documented as reviewed in the EPIC chart.  ROS:  Performed with pertinent positives and negatives included in the history, assessment and plan.   Additional significant findings :  none   Exam: Kim Counsellor Vitals:   05/17/14 1607  BP: 120/80  Height: 5\' 7"  (1.702 m)  Weight: 270 lb (122.471 kg)   General appearance:  Normal affect, orientation and appearance. Skin: Grossly normal HEENT: Without gross lesions.  No cervical or supraclavicular adenopathy. Thyroid normal.  Lungs:  Clear without wheezing, rales or rhonchi Cardiac: RR, without RMG Abdominal:  Soft, nontender, without masses, guarding, rebound, organomegaly or hernia Breasts:  Examined lying and sitting without masses, retractions, discharge or axillary adenopathy. Pelvic:  Ext/BUS/vagina normal  Adnexa  Without masses or tenderness    Anus and perineum  Normal   Rectovaginal  Normal sphincter tone without palpated masses or tenderness.    Assessment/Plan:  44 y.o. [id number] female for annual exam.   1. Status post LAVH for leiomyoma/menorrhagia 2009. Note some vaginal dryness with intercourse. Will check baseline FSH. Not having other symptoms such as hot flashes or night sweats. Use a vaginal lubricant was discussed. 2. Pap smear 04/2013. No Pap smear done today. No history of significant abnormal Pap smears. Options to stop screening as she is status post hysterectomy for benign indications versus less frequent screening intervals reviewed. Will readdress on annual basis. 3. Mammography 03/2014. Continue with annual mammography. SBE monthly reviewed. 4. Stop smoking reviewed. 5. Health maintenance. Patient being followed for hypertension. Had borderline elevated hemoglobin A1c  in the past. Is in the process of setting up with a new internist. Asked if I would do basic labs. CBC comprehensive metabolic panel hemoglobin A1cTSH FSH ordered.  I did not do a lipid profile as it was normal last year. Follow up in one year, sooner as needed.     Anastasio Auerbach MD, 4:39 PM 05/17/2014

## 2014-05-17 NOTE — ED Notes (Signed)
Pt c/o mid sternal CP with SOB worse with movement x 4 days worse last night and with laying down

## 2014-05-17 NOTE — Discharge Instructions (Signed)
Chest Wall Pain Chest wall pain is pain in or around the bones and muscles of your chest. It may take up to 6 weeks to get better. It may take longer if you must stay physically active in your work and activities.  CAUSES  Chest wall pain may happen on its own. However, it may be caused by:  A viral illness like the flu.  Injury.  Coughing.  Exercise.  Arthritis.  Fibromyalgia.  Shingles. HOME CARE INSTRUCTIONS   Avoid overtiring physical activity. Try not to strain or perform activities that cause pain. This includes any activities using your chest or your abdominal and side muscles, especially if heavy weights are used.  Put ice on the sore area.  Put ice in a plastic bag.  Place a towel between your skin and the bag.  Leave the ice on for 15-20 minutes per hour while awake for the first 2 days.  Only take over-the-counter or prescription medicines for pain, discomfort, or fever as directed by your caregiver. SEEK IMMEDIATE MEDICAL CARE IF:   Your pain increases, or you are very uncomfortable.  You have a fever.  Your chest pain becomes worse.  You have new, unexplained symptoms.  You have nausea or vomiting.  You feel sweaty or lightheaded.  You have a cough with phlegm (sputum), or you cough up blood. MAKE SURE YOU:   Understand these instructions.  Will watch your condition.  Will get help right away if you are not doing well or get worse. Document Released: 04/06/2005 Document Revised: 06/29/2011 Document Reviewed: 12/01/2010 [facility] Patient Information 2015 [place], [place]. This information is not intended to replace advice given to you by your health care provider. Make sure you discuss any questions you have with your health care provider.    Emergency Department Resource Guide 1) Find a Doctor and Pay Out of Pocket Although you won't have to find out who is covered by your insurance plan, it is a good idea to ask around and get recommendations.  You will then need to call the office and see if the doctor you have chosen will accept you as a new patient and what types of options they offer for patients who are self-pay. Some doctors offer discounts or will set up payment plans for their patients who do not have insurance, but you will need to ask so you aren't surprised when you get to your appointment.  2) Contact Your Local Health Department Not all health departments have doctors that can see patients for sick visits, but many do, so it is worth a call to see if yours does. If you don't know where your local health department is, you can check in your phone book. The CDC also has a tool to help you locate your state's health department, and many state websites also have listings of all of their local health departments.  3) Find a [facility] If your illness is not likely to be very severe or complicated, you may want to try a walk in clinic. These are popping up all over the country in pharmacies, drugstores, and shopping centers. They're usually staffed by nurse practitioners or physician assistants that have been trained to treat common illnesses and complaints. They're usually fairly quick and inexpensive. However, if you have serious medical issues or chronic medical problems, these are probably not your best option.  No Primary Care Doctor: - Call Health Connect at  [phone] - they can help you locate a primary care doctor  that  accepts your insurance, provides certain services, etc. - Physician Referral Service- [phone]  Chronic Pain Problems: Organization         Address  Phone   Notes  [facility]  [phone] Patients need to be referred by their primary care doctor.   Medication Assistance: Organization         Address  Phone   Notes  [facility] Medication [facility] [address] [place], [place] [address] [phone] --Must be a resident of [facility] --  Must have NO insurance coverage whatsoever (no Medicaid/ Medicare, etc.) -- The pt. MUST have a primary care doctor that directs their care regularly and follows them in the community   MedAssist  [phone]   [employer]  [phone]    Agencies that provide inexpensive medical care: Organization         Address  Phone   Notes  [place]  [phone]   [facility]    [phone]   [facility] [place], [place] [address] [phone]   [place] [address], [place] [phone]   Planned Parenthood    [phone]   [facility]    [phone]   [place] and [address], [place] Phone:  [phone], Fax:  [phone] Hours of Operation:  9 am - 6 pm, M-F.  Also accepts Medicaid/Medicare and self-pay.  [facility] for [place] [address], [address], [place] Phone: [phone], Fax: [phone]. Hours of Operation:  8:30 am - 5:30 pm, M-F.  Also accepts Medicaid and self-pay.  [facility] [place] [address] [place] Phone: [phone]   [place], [place], [place] [phone], Ext. [phone] Mondays & Thursdays: 7-9 AM.  First 15 patients are seen on a first come, first serve basis.    [place] Providers:  Organization         Address  Phone   Notes  [facility] [address], [place] [phone] Also accepts self-pay patients.  [facility] [address], [place]  [phone]   [place], [address], [place] [phone]   [facility] [address] [place] [phone]   Lucianne Lei [address], [place]   [phone] Only accepts [place] Access [place] patients  after they have their name applied to their card.   Self-Pay (no insurance) in [facility]:  Organization         Address  Phone   Notes  Sickle Cell Patients, [facility] Internal Medicine [place] [phone]   [facility] Urgent Care [place] [phone]   [facility] [place]  [place], [place],  [phone]   [facility]/Dr. Osei-Bonsu  [address], [place] or [address], [place] [phone] Phone number for both [place] and [place] locations is the same.  Urgent Medical and [facility] [address] [place] [phone]   [facility] [address] [place] or [address] [phone]  [facility] [place] [phone], phone; [phone], fax Sees patients 1st and 3rd Saturday of every month.  Must not qualify for public or private insurance (i.e. Medicaid, Medicare, [place] Health Choice, Veterans' Benefits)  Household income should be no more than 200% of the poverty level The clinic cannot treat you if you are pregnant or think you are pregnant  Sexually transmitted diseases are not treated at the clinic.    Dental Care: Organization         Address  Phone  Notes  [facility] Department of [facility] [place] [phone] Accepts children up to age 64 who are enrolled in [place] or [place]; pregnant women with a Medicaid card; and children who have applied for Medicaid or [place] Health Choice, but were declined, whose parents can pay a reduced fee at time of service.  [facility] Department of [facility]  [address], [place] [phone] Accepts children up to age 62 who are enrolled in [place] or [place]; pregnant women with a Medicaid card; and children  who have applied for Medicaid or [place] Health Choice, but were declined, whose parents can pay a reduced fee at time of service.  [place] Adult Dental Access PROGRAM  [place] [phone] Patients are seen by appointment only. Walk-ins are not accepted. [place] will see patients 12 years of age and older. Monday - Tuesday (8am-5pm) Most Wednesdays (8:30-5pm) $30 per visit, cash only  [facility] Adult Dental Access PROGRAM  [address], [facility] [phone] Patients are seen by appointment only. Walk-ins are not accepted. [place] will see patients 17 years of age and older. One Wednesday Evening (Monthly: Volunteer Based).  $30 per visit, cash only  [place]  [phone] for adults; Children under age 35, call Graduate Pediatric Dentistry at [phone]. Children aged 11-14, please call [phone] to request a pediatric application.  Dental services are provided in all areas of dental care including fillings, crowns and bridges, complete and partial dentures, implants, gum treatment, root canals, and extractions. Preventive care is also provided. Treatment is provided to both adults and children. Patients are selected via a lottery and there is often a waiting list.   [facility] [address], [place]  [phone] [email or web address]   [facility] [address] [place], [place] [phone], Ext. [phone] Second and Fourth Thursday of each month, opens at 6:30 AM; Clinic ends at 9 AM.  Patients are seen on a first-come first-served basis, and a limited number are seen during each clinic.   [facility]  [address] Hillard Danker [place], [place] [phone]   Eligibility Requirements You must have lived in [place], [place], or [place] counties for at least the last three months.   You cannot be eligible for state or federal sponsored [employer], including [employer], [place], or [employer].   You generally cannot be eligible for healthcare insurance through your employer.    How to apply: Eligibility screenings are held every Tuesday and Wednesday afternoon from 1:00 pm until 4:00 pm. You do not need an appointment for the interview!  [facility] [address], [place], [place]   [place]  [place] Department  [place]  Department  [phone]    Behavioral Health Resources in the Community: Intensive Outpatient Programs Organization         Address  Phone  Notes  [facility] [place]. [address] [place], [place] [phone]   [facility] Outpatient [address], [place], [place] Headquarters   ADS: Alcohol & Drug Svcs [address], [place], [place]   [place] [address],  [place], [place] or [phone]   Substance Abuse Resources Organization         Address  Phone  Notes  Alcohol and Drug Services  [phone]   [place]  [phone]   [facility] [place]   Chinita Pester  [phone]   Residential & Outpatient Substance Abuse Program  [phone]   Psychological Services Organization         Address  Phone  Notes  [facility] [place]  [place]  [phone]   [place] [address], [place] or [phone]    Mobile Crisis Teams Organization         Address  Phone  Notes  [facility], Mobile Crisis Care Unit  [phone]   [facility]  [address] [place], [place]   [place] Levels [address], [place] [phone]    Self-Help/Support Groups Organization         Address  Phone             Notes  [place]. of [place] -  variety of support groups  Leslie Call for more information  Narcotics Anonymous (NA), [facility] [address], [employer] [place]  2 meetings at this location   Special educational needs teacher         Address  Phone  Notes  [facility] Residential Treatment [place],    [place]  [phone]   [facility]  [address] [place] [phone], [place], [place]   [place], [place] [phone] Admissions: 8am-3pm M-F  Incentives Substance [place] [address]    [place], [place] [phone]   [facility] [address] [place], [place], [place]   [facility] [address],  [place], [place]   [facility]ight Programs - Intensive Outpatient [address], [place], [place]   [facility] ([place] [place].,  [place], [place] [phone] or [phone]   [facility]idential Treatment Services (RTS) [address] [place], [place] Accepts Medicaid  [facility] [place] [address]  [place] [phone] Substance Abuse/Addiction Treatment   [facility] Organization         Address  Phone  Notes  [facility]  [phone]   Domenic Schwab, PhD [address] Arlis Porta [place], [place]   [phone] or [phone]   [place] Erhard [place], [place] [phone]   [place] [address], [place], [place] [phone] Insurance/Medicaid/sponsorship through [employer] and Families [address], [place]                                    [place], [place] [phone] [place]  [place], [place] [phone]    Dr. Adele Schilder  [phone]   [facility] Dept. 1) [address] [place] 2) [place] 3)  [place] [address], [place] [phone]  [phone]   [place] [phone] or [phone] (After Hours)

## 2014-05-17 NOTE — Patient Instructions (Signed)
You may obtain a copy of any labs that were done today by logging onto MyChart as outlined in the instructions provided with your AVS (after visit summary). The office will not call with normal lab results but certainly if there are any significant abnormalities then we will contact you.   Health Maintenance, Female A healthy lifestyle and preventative care can promote health and wellness.  Maintain regular health, dental, and eye exams.  Eat a healthy diet. Foods like vegetables, fruits, whole grains, low-fat dairy products, and lean protein foods contain the nutrients you need without too many calories. Decrease your intake of foods high in solid fats, added sugars, and salt. Get information about a proper diet from your caregiver, if necessary.  Regular physical exercise is one of the most important things you can do for your health. Most adults should get at least 150 minutes of moderate-intensity exercise (any activity that increases your heart rate and causes you to sweat) each week. In addition, most adults need muscle-strengthening exercises on 2 or more days a week.   Maintain a healthy weight. The body mass index (BMI) is a screening tool to identify possible weight problems. It provides an estimate of body fat based on height and weight. Your caregiver can help determine your BMI, and can help you achieve or maintain a healthy weight. For adults 20 years and older:  A BMI below 18.5 is considered underweight.  A BMI of 18.5 to 24.9 is normal.  A BMI of 25 to 29.9 is considered overweight.  A BMI of 30 and above is considered obese.  Maintain normal blood lipids and cholesterol by exercising and minimizing your intake of saturated fat. Eat a balanced diet with plenty of fruits and vegetables. Blood tests for lipids and cholesterol should begin at age 61 and be repeated every 5 years. If your lipid or cholesterol levels are high, you are over 50, or you are a high risk for heart  disease, you may need your cholesterol levels checked more frequently.Ongoing high lipid and cholesterol levels should be treated with medicines if diet and exercise are not effective.  If you smoke, find out from your caregiver how to quit. If you do not use tobacco, do not start.  Lung cancer screening is recommended for adults aged 33 80 years who are at high risk for developing lung cancer because of a history of smoking. Yearly low-dose computed tomography (CT) is recommended for people who have at least a 30-pack-year history of smoking and are a current smoker or have quit within the past 15 years. A pack year of smoking is smoking an average of 1 pack of cigarettes a day for 1 year (for example: 1 pack a day for 30 years or 2 packs a day for 15 years). Yearly screening should continue until the smoker has stopped smoking for at least 15 years. Yearly screening should also be stopped for people who develop a health problem that would prevent them from having lung cancer treatment.  If you are pregnant, do not drink alcohol. If you are breastfeeding, be very cautious about drinking alcohol. If you are not pregnant and choose to drink alcohol, do not exceed 1 drink per day. One drink is considered to be 12 ounces (355 mL) of beer, 5 ounces (148 mL) of wine, or 1.5 ounces (44 mL) of liquor.  Avoid use of street drugs. Do not share needles with anyone. Ask for help if you need support or instructions about stopping  the use of drugs.  High blood pressure causes heart disease and increases the risk of stroke. Blood pressure should be checked at least every 1 to 2 years. Ongoing high blood pressure should be treated with medicines, if weight loss and exercise are not effective.  If you are 59 to 44 years old, ask your caregiver if you should take aspirin to prevent strokes.  Diabetes screening involves taking a blood sample to check your fasting blood sugar level. This should be done once every 3  years, after age 91, if you are within normal weight and without risk factors for diabetes. Testing should be considered at a younger age or be carried out more frequently if you are overweight and have at least 1 risk factor for diabetes.  Breast cancer screening is essential preventative care for women. You should practice "breast self-awareness." This means understanding the normal appearance and feel of your breasts and may include breast self-examination. Any changes detected, no matter how small, should be reported to a caregiver. Women in their 66s and 30s should have a clinical breast exam (CBE) by a caregiver as part of a regular health exam every 1 to 3 years. After age 101, women should have a CBE every year. Starting at age 100, women should consider having a mammogram (breast X-ray) every year. Women who have a family history of breast cancer should talk to their caregiver about genetic screening. Women at a high risk of breast cancer should talk to their caregiver about having an MRI and a mammogram every year.  Breast cancer gene (BRCA)-related cancer risk assessment is recommended for women who have family members with BRCA-related cancers. BRCA-related cancers include breast, ovarian, tubal, and peritoneal cancers. Having family members with these cancers may be associated with an increased risk for harmful changes (mutations) in the breast cancer genes BRCA1 and BRCA2. Results of the assessment will determine the need for genetic counseling and BRCA1 and BRCA2 testing.  The Pap test is a screening test for cervical cancer. Women should have a Pap test starting at age 57. Between ages 25 and 35, Pap tests should be repeated every 2 years. Beginning at age 37, you should have a Pap test every 3 years as long as the past 3 Pap tests have been normal. If you had a hysterectomy for a problem that was not cancer or a condition that could lead to cancer, then you no longer need Pap tests. If you are  between ages 50 and 76, and you have had normal Pap tests going back 10 years, you no longer need Pap tests. If you have had past treatment for cervical cancer or a condition that could lead to cancer, you need Pap tests and screening for cancer for at least 20 years after your treatment. If Pap tests have been discontinued, risk factors (such as a new sexual partner) need to be reassessed to determine if screening should be resumed. Some women have medical problems that increase the chance of getting cervical cancer. In these cases, your caregiver may recommend more frequent screening and Pap tests.  The human papillomavirus (HPV) test is an additional test that may be used for cervical cancer screening. The HPV test looks for the virus that can cause the cell changes on the cervix. The cells collected during the Pap test can be tested for HPV. The HPV test could be used to screen women aged 44 years and older, and should be used in women of any age  who have unclear Pap test results. After the age of 55, women should have HPV testing at the same frequency as a Pap test.  Colorectal cancer can be detected and often prevented. Most routine colorectal cancer screening begins at the age of 44 and continues through age 20. However, your caregiver may recommend screening at an earlier age if you have risk factors for colon cancer. On a yearly basis, your caregiver may provide home test kits to check for hidden blood in the stool. Use of a small camera at the end of a tube, to directly examine the colon (sigmoidoscopy or colonoscopy), can detect the earliest forms of colorectal cancer. Talk to your caregiver about this at age 86, when routine screening begins. Direct examination of the colon should be repeated every 5 to 10 years through age 13, unless early forms of pre-cancerous polyps or small growths are found.  Hepatitis C blood testing is recommended for all people born from [birth year] through 1965 and any  individual with known risks for hepatitis C.  Practice safe sex. Use condoms and avoid high-risk sexual practices to reduce the spread of sexually transmitted infections (STIs). Sexually active women aged 36 and younger should be checked for Chlamydia, which is a common sexually transmitted infection. Older women with new or multiple partners should also be tested for Chlamydia. Testing for other STIs is recommended if you are sexually active and at increased risk.  Osteoporosis is a disease in which the bones lose minerals and strength with aging. This can result in serious bone fractures. The risk of osteoporosis can be identified using a bone density scan. Women ages 20 and over and women at risk for fractures or osteoporosis should discuss screening with their caregivers. Ask your caregiver whether you should be taking a calcium supplement or vitamin D to reduce the rate of osteoporosis.  Menopause can be associated with physical symptoms and risks. Hormone replacement therapy is available to decrease symptoms and risks. You should talk to your caregiver about whether hormone replacement therapy is right for you.  Use sunscreen. Apply sunscreen liberally and repeatedly throughout the day. You should seek shade when your shadow is shorter than you. Protect yourself by wearing long sleeves, pants, a wide-brimmed hat, and sunglasses year round, whenever you are outdoors.  Notify your caregiver of new moles or changes in moles, especially if there is a change in shape or color. Also notify your caregiver if a mole is larger than the size of a pencil eraser.  Stay current with your immunizations. Document Released: 10/20/2010 Document Revised: 08/01/2012 Document Reviewed: 10/20/2010 [facility] Patient Information 2014 [employer].

## 2014-05-18 LAB — COMPREHENSIVE METABOLIC PANEL
ALK PHOS: 64 U/L (ref 39–117)
ALT: 17 U/L (ref 0–35)
AST: 17 U/L (ref 0–37)
Albumin: 4.2 g/dL (ref 3.5–5.2)
BUN: 12 mg/dL (ref 6–23)
CALCIUM: 9.2 mg/dL (ref 8.4–10.5)
CHLORIDE: 105 meq/L (ref 96–112)
CO2: 25 mEq/L (ref 19–32)
Creat: 0.81 mg/dL (ref 0.50–1.10)
Glucose, Bld: 79 mg/dL (ref 70–99)
POTASSIUM: 3.9 meq/L (ref 3.5–5.3)
SODIUM: 141 meq/L (ref 135–145)
Total Bilirubin: 0.5 mg/dL (ref 0.2–1.2)
Total Protein: 6.9 g/dL (ref 6.0–8.3)

## 2014-05-18 LAB — URINALYSIS W MICROSCOPIC + REFLEX CULTURE
Bacteria, UA: NONE SEEN
Bilirubin Urine: NEGATIVE
CRYSTALS: NONE SEEN
Casts: NONE SEEN
Glucose, UA: NEGATIVE mg/dL
Hgb urine dipstick: NEGATIVE
Leukocytes, UA: NEGATIVE
Nitrite: NEGATIVE
PH: 6 (ref 5.0–8.0)
PROTEIN: NEGATIVE mg/dL
Specific Gravity, Urine: 1.028 (ref 1.005–1.030)
Urobilinogen, UA: 0.2 mg/dL (ref 0.0–1.0)

## 2014-05-18 LAB — HEMOGLOBIN A1C
Hgb A1c MFr Bld: 6.4 % — ABNORMAL HIGH (ref <5.7)
Mean Plasma Glucose: 137 mg/dL — ABNORMAL HIGH (ref <117)

## 2014-05-18 LAB — FOLLICLE STIMULATING HORMONE: FSH: 8.7 m[IU]/mL

## 2014-05-18 LAB — TSH: TSH: 1.11 u[IU]/mL (ref 0.350–4.500)

## 2014-07-23 ENCOUNTER — Emergency Department (HOSPITAL_COMMUNITY)
Admission: EM | Admit: 2014-07-23 | Discharge: 2014-07-23 | Disposition: A | Discharge status: Home / Self Care | Payer: BLUE CROSS/BLUE SHIELD | Attending: Emergency Medicine | Admitting: Emergency Medicine

## 2014-07-23 ENCOUNTER — Encounter (HOSPITAL_COMMUNITY): Payer: Self-pay | Admitting: Emergency Medicine

## 2014-07-23 DIAGNOSIS — Z720 Tobacco use: Secondary | ICD-10-CM | POA: Insufficient documentation

## 2014-07-23 DIAGNOSIS — Z79899 Other long term (current) drug therapy: Secondary | ICD-10-CM | POA: Diagnosis not present

## 2014-07-23 DIAGNOSIS — Z91040 Latex allergy status: Secondary | ICD-10-CM | POA: Diagnosis not present

## 2014-07-23 DIAGNOSIS — R0602 Shortness of breath: Secondary | ICD-10-CM | POA: Diagnosis not present

## 2014-07-23 DIAGNOSIS — I10 Essential (primary) hypertension: Secondary | ICD-10-CM | POA: Diagnosis not present

## 2014-07-23 DIAGNOSIS — F419 Anxiety disorder, unspecified: Secondary | ICD-10-CM | POA: Diagnosis not present

## 2014-07-23 HISTORY — DX: Anxiety disorder, unspecified: F41.9

## 2014-07-23 MED ORDER — ALPRAZOLAM 0.5 MG PO TABS
1.0000 mg | ORAL_TABLET | Freq: Once | ORAL | Status: AC
  Administered 2014-07-23: 1 mg via ORAL
  Filled 2014-07-23: qty 2

## 2014-07-23 MED ORDER — ALPRAZOLAM 0.5 MG PO TABS: 0.5000 mg | ORAL_TABLET | Freq: Two times a day (BID) | ORAL | Status: DC | PRN

## 2014-07-23 NOTE — Discharge Instructions (Signed)
Panic Attacks °Panic attacks are sudden, short feelings of great fear or discomfort. You may have them for no reason when you are relaxed, when you are uneasy (anxious), or when you are sleeping.  °HOME CARE °· Take all your medicines as told. °· Check with your doctor before starting new medicines. °· Keep all doctor visits. °GET HELP IF: °· You are not able to take your medicines as told. °· Your symptoms do not get better. °· Your symptoms get worse. °GET HELP RIGHT AWAY IF: °· Your attacks seem different than your normal attacks. °· You have thoughts about hurting yourself or others. °· You take panic attack medicine and you have a side effect. °MAKE SURE YOU: °· Understand these instructions. °· Will watch your condition. °· Will get help right away if you are not doing well or get worse. °Document Released: 05/09/2010 Document Revised: 01/25/2013 Document Reviewed: 11/18/2012 °[employer]® Patient Information ©2015 [employer]. This information is not intended to replace advice given to you by your health care provider. Make sure you discuss any questions you have with your health care provider. ° °

## 2014-07-23 NOTE — ED Notes (Signed)
Per EMS, patient reports increased anxiety since the am. Reports intermittent short peroids of attacks, is able to self calm. Patient states she had a recent medication change, she is in between doctors, is taking some left over medications at home. Stressors include work, Publishing rights manager, Engineer, building services. Denies SI, [place].

## 2014-07-23 NOTE — ED Provider Notes (Signed)
CSN: [id number]     Arrival date & time 07/23/14  0128 History   First MD Initiated Contact with Patient 07/23/14 0302     Chief Complaint  Patient presents with  . Anxiety     (Consider location/radiation/quality/duration/timing/severity/associated sxs/prior Treatment) Patient is a 44 y.o. female presenting with anxiety. The history is provided by the patient. No language interpreter was used.  Anxiety This is a recurrent problem. The current episode started today. Pertinent negatives include no abdominal pain, chills, fever or nausea. Associated symptoms comments: She presents with symptoms of palpitations, SOB after waking from sleep in a panic "like I couldn't breathe." She has a history of anxiety/panic with worsening symptoms in the last several weeks, becoming more intense and frequent. .    Past Medical History  Diagnosis Date  . Hypertension   . Anxiety    Past Surgical History  Procedure Laterality Date  . Myomectomy  2005  . Bunionectomy  2012  . Cesarean section      X 2  . Lavh  2009    Leiomyoma and menorrhagia  . Abdominal hysterectomy      partial   Family History  Problem Relation Age of Onset  . Hypertension Mother   . Hypertension Father   . Hypertension Sister    History  Substance Use Topics  . Smoking status: Current Every Day Smoker -- 1.00 packs/day    Types: Cigarettes  . Smokeless tobacco: Never Used  . Alcohol Use: 0.0 oz/week    0 Standard drinks or equivalent per week     Comment: socially   OB History    Gravida Para Term Preterm AB TAB SAB Ectopic Multiple Living   4 2   2  2   2      Review of Systems  Constitutional: Negative for fever and chills.  HENT: Negative.   Respiratory: Positive for shortness of breath.   Cardiovascular: Positive for palpitations.  Gastrointestinal: Negative.  Negative for nausea and abdominal pain.  Musculoskeletal: Negative.   Skin: Negative.   Neurological: Negative.   Psychiatric/Behavioral: The  patient is nervous/anxious.       Allergies  Latex  Home Medications   Prior to Admission medications   Medication Sig Start Date End Date Taking? Authorizing Provider  amLODipine (NORVASC) 10 MG tablet Take 10 mg by mouth daily.   Yes Historical Provider, MD  citalopram (CELEXA) 20 MG tablet Take 20 mg by mouth daily.   Yes Historical Provider, MD  losartan (COZAAR) 50 MG tablet Take 50 mg by mouth daily.   Yes Historical Provider, MD  spironolactone (ALDACTONE) 25 MG tablet Take 25 mg by mouth daily.    Yes Historical Provider, MD  etodolac (LODINE) 200 MG capsule Take 1 capsule (200 mg total) by mouth every 8 (eight) hours. Patient not taking: Reported on 07/23/2014 05/17/14   Katheren Shams, MD   BP 136/76 mmHg  Pulse 88  Temp(Src) 98.6 F (37 C)  Resp 20  SpO2 100% Physical Exam  Constitutional: She is oriented to person, place, and time. She appears well-developed and well-nourished.  HENT:  Head: Normocephalic.  Neck: Normal range of motion. Neck supple.  Cardiovascular: Normal rate and regular rhythm.   Pulmonary/Chest: Effort normal and breath sounds normal.  Abdominal: Soft. Bowel sounds are normal. There is no tenderness. There is no rebound and no guarding.  Musculoskeletal: Normal range of motion.  Neurological: She is alert and oriented to person, place, and time.  Skin: Skin is  warm and dry. No rash noted.  Psychiatric: She has a normal mood and affect.    ED Course  Procedures (including critical care time) Labs Review Labs Reviewed - No data to display  Imaging Review No results found.   EKG Interpretation None      MDM   Final diagnoses:  None    1. Panic/anxiety  Symptoms like previous panic attacks, worsening over time, currently asymptomatic. Will prescribe Xanax (#10) and encourage follow up with PCP.    Charlann Lange, PA-C 07/23/14 [place], MD 07/23/14 0630

## 2014-07-23 NOTE — ED Notes (Signed)
Bed: WTR5 Expected date:  Expected time:  Means of arrival:  Comments: EMS anxiety/ambulatory

## 2014-07-23 NOTE — ED Notes (Signed)
Patient reports a long history of anxiety. States her last severe attack 1-2 years ago. Patient states she was taking Xanax and was changed to Klonopin, and then changed to Citalopram. Patient states she took herself off the Citalopram because she was feeling better. Patient states she began having mini attacks about one week ago. States that on 07/22/2014 she woke up suddenly with a sensation of hotness over her entire body. Patient states she attempted to splash cool water on her face without relief, states she ended up dousing herself in cold water before beginning to get any relief for the hot feeling. Patient states she took one 20mg  Citalopram at @2100 , states she is starting to feel better now.

## 2014-09-09 ENCOUNTER — Emergency Department (HOSPITAL_COMMUNITY)
Admission: EM | Admit: 2014-09-09 | Discharge: 2014-09-09 | Disposition: A | Discharge status: Home / Self Care | Payer: BLUE CROSS/BLUE SHIELD | Attending: Emergency Medicine | Admitting: Emergency Medicine

## 2014-09-09 ENCOUNTER — Encounter (HOSPITAL_COMMUNITY): Payer: Self-pay | Admitting: Emergency Medicine

## 2014-09-09 ENCOUNTER — Emergency Department (HOSPITAL_COMMUNITY): Payer: BLUE CROSS/BLUE SHIELD

## 2014-09-09 DIAGNOSIS — R0602 Shortness of breath: Secondary | ICD-10-CM | POA: Diagnosis not present

## 2014-09-09 DIAGNOSIS — Z79899 Other long term (current) drug therapy: Secondary | ICD-10-CM | POA: Diagnosis not present

## 2014-09-09 DIAGNOSIS — Z720 Tobacco use: Secondary | ICD-10-CM | POA: Diagnosis not present

## 2014-09-09 DIAGNOSIS — Z91040 Latex allergy status: Secondary | ICD-10-CM | POA: Diagnosis not present

## 2014-09-09 DIAGNOSIS — I10 Essential (primary) hypertension: Secondary | ICD-10-CM | POA: Diagnosis not present

## 2014-09-09 DIAGNOSIS — R5383 Other fatigue: Secondary | ICD-10-CM | POA: Diagnosis not present

## 2014-09-09 DIAGNOSIS — R42 Dizziness and giddiness: Secondary | ICD-10-CM | POA: Diagnosis present

## 2014-09-09 HISTORY — DX: Depression, unspecified: F32.A

## 2014-09-09 HISTORY — DX: Major depressive disorder, single episode, unspecified: F32.9

## 2014-09-09 LAB — BASIC METABOLIC PANEL
ANION GAP: 8 (ref 5–15)
BUN: 11 mg/dL (ref 6–20)
CALCIUM: 9.3 mg/dL (ref 8.9–10.3)
CO2: 25 mmol/L (ref 22–32)
CREATININE: 0.82 mg/dL (ref 0.44–1.00)
Chloride: 105 mmol/L (ref 101–111)
GFR calc Af Amer: >60 mL/min (ref >60)
GFR calc non Af Amer: >60 mL/min (ref >60)
GLUCOSE: 101 mg/dL — AB (ref 65–99)
POTASSIUM: 3.7 mmol/L (ref 3.5–5.1)
Sodium: 138 mmol/L (ref 135–145)

## 2014-09-09 LAB — CBC
HCT: 41.9 % (ref 36.0–46.0)
Hemoglobin: 13.5 g/dL (ref 12.0–15.0)
MCH: 30 pg (ref 26.0–34.0)
MCHC: 32.2 g/dL (ref 30.0–36.0)
MCV: 93.1 fL (ref 78.0–100.0)
Platelets: 248 10*3/uL (ref 150–400)
RBC: 4.5 MIL/uL (ref 3.87–5.11)
RDW: 13.6 % (ref 11.5–15.5)
WBC: 8.4 10*3/uL (ref 4.0–10.5)

## 2014-09-09 LAB — I-STAT TROPONIN, ED: TROPONIN I, POC: 0 ng/mL (ref 0.00–0.08)

## 2014-09-09 LAB — BRAIN NATRIURETIC PEPTIDE: B Natriuretic Peptide: 7.7 pg/mL (ref 0.0–100.0)

## 2014-09-09 MED ORDER — LORAZEPAM 1 MG PO TABS
1.0000 mg | ORAL_TABLET | Freq: Once | ORAL | Status: AC
  Administered 2014-09-09: 1 mg via ORAL
  Filled 2014-09-09: qty 1

## 2014-09-09 NOTE — ED Notes (Signed)
C/o sob and feeling sluggish x 1 week.  Reports dizziness since Friday.  No neuro deficits noted on triage exam.  Denies pain.

## 2014-09-09 NOTE — ED Provider Notes (Signed)
CSN: [id number]     Arrival date & time 09/09/14  2123 History   First MD Initiated Contact with Patient 09/09/14 2137     Chief Complaint  Patient presents with  . Shortness of Breath  . Dizziness     (Consider location/radiation/quality/duration/timing/severity/associated sxs/prior Treatment) HPI Comments: 44 year old female with history of hypertension and anxiety comes in with several weeks worth of "not feeling like herself". Initial triage note states she felt short of breath however on my examination she is telling me she does not feel short of breath except when she has anxiety issues. States for the last several weeks she is felt some generalized fatigue, feeling like she is not resting well. Denies any chest pain, abdominal pain, diarrhea, constipation abdominal pain or other  Patient is a 44 y.o. female presenting with general illness.  Illness Location:  Generalized "not feeling like herself" Severity:  Moderate Onset quality:  Gradual Timing:  Constant Progression:  Unchanged Chronicity:  New Context:  Unknown Associated symptoms: fatigue   Associated symptoms: no abdominal pain, no chest pain, no congestion, no headaches, no rash and no shortness of breath (only has occasional shortness of breath with anxiety attacks)     Past Medical History  Diagnosis Date  . Hypertension   . Anxiety   . Depression    Past Surgical History  Procedure Laterality Date  . Myomectomy  2005  . Bunionectomy  2012  . Cesarean section      X 2  . Lavh  2009    Leiomyoma and menorrhagia  . Abdominal hysterectomy      partial   Family History  Problem Relation Age of Onset  . Hypertension Mother   . Hypertension Father   . Hypertension Sister    History  Substance Use Topics  . Smoking status: Current Every Day Smoker -- 1.00 packs/day    Types: Cigarettes  . Smokeless tobacco: Never Used  . Alcohol Use: 0.0 oz/week    0 Standard drinks or equivalent per week     Comment:  socially   OB History    Gravida Para Term Preterm AB TAB SAB Ectopic Multiple Living   4 2   2  2   2      Review of Systems  Constitutional: Positive for fatigue.  HENT: Negative for congestion.   Respiratory: Negative for shortness of breath (only has occasional shortness of breath with anxiety attacks).   Cardiovascular: Negative for chest pain.  Gastrointestinal: Negative for abdominal pain.  Genitourinary: Negative for dysuria.  Musculoskeletal: Negative for back pain.  Skin: Negative for rash.  Neurological: Negative for headaches.  Psychiatric/Behavioral: Negative for confusion.  All other systems reviewed and are negative.     Allergies  Latex  Home Medications   Prior to Admission medications   Medication Sig Start Date End Date Taking? Authorizing Provider  amLODipine (NORVASC) 10 MG tablet Take 10 mg by mouth daily.   Yes Historical Provider, MD  citalopram (CELEXA) 20 MG tablet Take 20 mg by mouth daily.   Yes Historical Provider, MD  gabapentin (NEURONTIN) 300 MG capsule Take 300 mg by mouth daily. 07/31/14  Yes Historical Provider, MD  losartan (COZAAR) 50 MG tablet Take 50 mg by mouth daily.   Yes Historical Provider, MD  spironolactone (ALDACTONE) 25 MG tablet Take 25 mg by mouth daily.    Yes Historical Provider, MD  ALPRAZolam Duanne Moron) 0.5 MG tablet Take 1 tablet (0.5 mg total) by mouth 2 (two) times daily  as needed for anxiety. Patient not taking: Reported on 09/09/2014 07/23/14   Charlann Lange, PA-C  etodolac (LODINE) 200 MG capsule Take 1 capsule (200 mg total) by mouth every 8 (eight) hours. Patient not taking: Reported on 07/23/2014 05/17/14   Katheren Shams, MD   BP 121/70 mmHg  Pulse 73  Temp(Src) 98.4 F (36.9 C) (Oral)  Resp 15  Wt 274 lb 8 oz (124.512 kg)  SpO2 95% Physical Exam  Constitutional: She is oriented to person, place, and time. She appears well-developed.  HENT:  Head: Normocephalic and atraumatic.  Eyes: Pupils are equal, round, and  reactive to light.  Neck: Normal range of motion.  Cardiovascular: Normal rate and intact distal pulses.   No murmur heard. Pulmonary/Chest: Effort normal.  Abdominal: Soft. She exhibits no distension. There is no tenderness.  Musculoskeletal: She exhibits no tenderness.  Neurological: She is alert and oriented to person, place, and time. No cranial nerve deficit. She exhibits normal muscle tone.  Skin: Skin is warm.  Psychiatric:  Does endorse some history of anxiety and depression but denies suicidal ideation or homicidal ideation  Vitals reviewed.   ED Course  Procedures (including critical care time) Labs Review Labs Reviewed  BASIC METABOLIC PANEL - Abnormal; Notable for the following:    Glucose, Bld 101 (*)    All other components within normal limits  CBC  BRAIN NATRIURETIC PEPTIDE  I-STAT TROPOININ, ED    Imaging Review Dg Chest 2 View  09/09/2014   CLINICAL DATA:  Shortness of breath and dizziness.  EXAM: CHEST  2 VIEW  COMPARISON:  Chest radiograph 05/17/2014  FINDINGS: Stable enlarged cardiac and mediastinal contours. Chronic bilateral interstitial pulmonary opacities. No pleural effusion or pneumothorax. Regional skeleton is unremarkable.  IMPRESSION: No acute cardiopulmonary process.  Stable cardiac enlargement.  Stable bilateral mid and lower lung scarring.   Electronically Signed   By: Lovey Newcomer M.D.   On: 09/09/2014 21:55     EKG Interpretation   Date/Time:  Sunday Sep 09 2014 21:35:24 EDT Ventricular Rate:  70 PR Interval:  162 QRS Duration: 86 QT Interval:  388 QTC Calculation: 419 R Axis:   37 Text Interpretation:  Normal sinus rhythm with sinus arrhythmia No  significant change since last tracing Confirmed by Ashok Cordia  MD, Lennette Bihari  ([id number]) on 09/09/2014 9:50:59 PM      MDM  44 year old female with several weeks of "not feeling like herself". Patient does endorse she has a history of anxiety and has occasional anxiety attacks. States they've been more  common since she ran out of her Xanax several weeks ago. She has no focal findings on her examination. Labs were obtained CBC and BMP were unremarkable. Chest x-ray showed no abnormalities. Triage ordered troponins and BNP however patient is not complaining of chest pain swelling orthopnea or other. Patient overall appears very well. Normal vital signs. With a completely normal physical exam normal labs tablet there is any emergent pathology at this time. Explained deficit with patient there are multiple options which could cause her feelings of fatigue and not like herself for several weeks including hormone changes, sleep apnea, depression, running out of Xanax or medication changes etc. Given patient does state she has significant snoring, poor sleep and is overweight concern sleep apnea may be an issue. Discussed this with her and recommend following up PCP. However at this time there is no signs of anemia cardiac-related issues or other serious which would cause any of these issues. We'll recommend patient  establish PCP for long-term management and workup. Discussed return precautions patient.   Final diagnoses:  Other fatigue        Robynn Pane, MD 09/09/14 2317  Lajean Saver, MD 09/10/14 [phone]

## 2014-09-09 NOTE — Discharge Instructions (Signed)

## 2015-05-15 ENCOUNTER — Other Ambulatory Visit: Payer: Self-pay

## 2015-05-15 DIAGNOSIS — Z1231 Encounter for screening mammogram for malignant neoplasm of breast: Secondary | ICD-10-CM

## 2015-05-24 ENCOUNTER — Ambulatory Visit
Admission: RE | Admit: 2015-05-24 | Discharge: 2015-05-24 | Disposition: A | Discharge status: Home / Self Care | Payer: BLUE CROSS/BLUE SHIELD | Source: Ambulatory Visit

## 2015-05-24 DIAGNOSIS — Z1231 Encounter for screening mammogram for malignant neoplasm of breast: Secondary | ICD-10-CM

## 2015-06-06 ENCOUNTER — Encounter: Payer: Self-pay | Admitting: Gynecology

## 2015-06-06 ENCOUNTER — Ambulatory Visit (INDEPENDENT_AMBULATORY_CARE_PROVIDER_SITE_OTHER): Payer: BLUE CROSS/BLUE SHIELD | Admitting: Gynecology

## 2015-06-06 VITALS — BP 124/80 | Ht 67.0 in | Wt 281.0 lb

## 2015-06-06 DIAGNOSIS — Z01419 Encounter for gynecological examination (general) (routine) without abnormal findings: Secondary | ICD-10-CM

## 2015-06-06 NOTE — Patient Instructions (Signed)
Consider Stop Smoking.  Help is available at [facility]'s smoking cessation program @ [email or web address] or 336-832-0838. OR 1-800-QUIT-NOW (1-800-784-8669) for free smoking cessation counseling.  Smokefree.gov (http://www.smokefree.gov) provides free, accurate, evidence-based information and professional assistance to help support the immediate and long-term needs of people trying to quit smoking.    Smoking Hazards Smoking cigarettes is extremely bad for your health. Tobacco smoke has over 200 known poisons in it. There are over 60 chemicals in tobacco smoke that cause cancer. Some of the chemicals found in cigarette smoke include:  Cyanide.  Benzene.  Formaldehyde.  Methanol (wood alcohol).  Acetylene (fuel used in welding torches).  Ammonia.  Cigarette smoke also contains the poisonous gases nitrogen oxide and carbon monoxide.  Cigarette smokers have an increased risk of many serious medical problems, including: Lung cancer.  Lung disease (such as pneumonia, bronchitis, and emphysema).  Heart attack and chest pain due to the heart not getting enough oxygen (angina).  Heart disease and peripheral blood vessel disease.  Hypertension.  Stroke.  Oral cancer (cancer of the lip, mouth, or voice box).  Bladder cancer.  Pancreatic cancer.  Cervical cancer.  Pregnancy complications, including premature birth.  Low birthweight babies.  Early menopause.  Lower estrogen level for women.  Infertility.  Facial wrinkles.  Blindness.  Increased risk of broken bones (fractures).  Senile dementia.  Stillbirths and smaller newborn babies, birth defects, and genetic damage to sperm.  Stomach ulcers and internal bleeding.  Children of smokers have an increased risk of the following, because of secondhand smoke exposure:  Sudden infant death syndrome (SIDS).  Respiratory infections.  Lung cancer.  Heart disease.  Ear infections.  Smoking causes approximately: 90% of all lung cancer  deaths in men.  80% of all lung cancer deaths in women.  90% of deaths from chronic obstructive lung disease.  Compared with nonsmokers, smoking increases the risk of: Coronary heart disease by 2 to 4 times.  Stroke by 2 to 4 times.  Men developing lung cancer by 23 times.  Women developing lung cancer by 13 times.  Dying from chronic obstructive lung diseases by 12 times.  Someone who smokes 2 packs a day loses about 8 years of his or her life. Even smoking lightly shortens your life expectancy by several years. You can greatly reduce the risk of medical problems for you and your family by stopping now. Smoking is the most preventable cause of death and disease in our society. Within days of quitting smoking, your circulation returns to normal, you decrease the risk of having a heart attack, and your lung capacity improves. There may be some increased phlegm in the first few days after quitting, and it may take months for your lungs to clear up completely. Quitting for 10 years cuts your lung cancer risk to almost that of a nonsmoker. WHY IS SMOKING ADDICTIVE? Nicotine is the chemical agent in tobacco that is capable of causing addiction or dependence.  When you smoke and inhale, nicotine is absorbed rapidly into the bloodstream through your lungs. Nicotine absorbed through the lungs is capable of creating a powerful addiction. Both inhaled and non-inhaled nicotine may be addictive.  Addiction studies of cigarettes and spit tobacco show that addiction to nicotine occurs mainly during the teen years, when young people begin using tobacco products.  WHAT ARE THE BENEFITS OF QUITTING?  There are many health benefits to quitting smoking.  Likelihood of developing cancer and heart disease decreases. Health improvements are seen almost immediately.    Blood pressure, pulse rate, and breathing patterns start returning to normal soon after quitting.  People who quit may see an improvement in their overall  quality of life.  Some people choose to quit all at once. Other options include nicotine replacement products, such as patches, gum, and nasal sprays. Do not use these products without first checking with your caregiver. QUITTING SMOKING It is not easy to quit smoking. Nicotine is addicting, and longtime habits are hard to change. To start, you can write down all your reasons for quitting, tell your family and friends you want to quit, and ask for their help. Throw your cigarettes away, chew gum or cinnamon sticks, keep your hands busy, and drink extra water or juice. Go for walks and practice deep breathing to relax. Think of all the money you are saving: around $1,000 a year, for the average pack-a-day smoker. Nicotine patches and gum have been shown to improve success at efforts to stop smoking. Zyban (bupropion) is an anti-depressant drug that can be prescribed to reduce nicotine withdrawal symptoms and to suppress the urge to smoke. Smoking is an addiction with both physical and psychological effects. Joining a stop-smoking support group can help you cope with the emotional issues. For more information and advice on programs to stop smoking, call your doctor, your local hospital, or these organizations: American [employer] Association - 1-800-LUNGUSA   Smoking Cessation  This document explains the best ways for you to quit smoking and new treatments to help. It lists new medicines that can double or triple your chances of quitting and quitting for good. It also considers ways to avoid relapses and concerns you may have about quitting, including weight gain. NICOTINE: A POWERFUL ADDICTION If you have tried to quit smoking, you know how hard it can be. It is hard because nicotine is a very addictive drug. For some people, it can be as addictive as heroin or cocaine. Usually, people make 2 or 3 tries, or more, before finally being able to quit. Each time you try to quit, you can learn about what helps and  what hurts. Quitting takes hard work and a lot of effort, but you can quit smoking. QUITTING SMOKING IS ONE OF THE MOST IMPORTANT THINGS YOU WILL EVER DO.  You will live longer, feel better, and live better.   The impact on your body of quitting smoking is felt almost immediately:   Within 20 minutes, blood pressure decreases. Pulse returns to its normal level.   After 8 hours, carbon monoxide levels in the blood return to normal. Oxygen level increases.   After 24 hours, chance of heart attack starts to decrease. Breath, hair, and body stop smelling like smoke.   After 48 hours, damaged nerve endings begin to recover. Sense of taste and smell improve.   After 72 hours, the body is virtually free of nicotine. Bronchial tubes relax and breathing becomes easier.   After 2 to 12 weeks, lungs can hold more air. Exercise becomes easier and circulation improves.   Quitting will reduce your risk of having a heart attack, stroke, cancer, or lung disease:   After 1 year, the risk of coronary heart disease is cut in half.   After 5 years, the risk of stroke falls to the same as a nonsmoker.   After 10 years, the risk of lung cancer is cut in half and the risk of other cancers decreases significantly.   After 15 years, the risk of coronary heart disease drops, usually   to the level of a nonsmoker.   If you are pregnant, quitting smoking will improve your chances of having a healthy baby.   The people you live with, especially your children, will be healthier.   You will have extra money to spend on things other than cigarettes.  FIVE KEYS TO QUITTING Studies have shown that these 5 steps will help you quit smoking and quit for good. You have the best chances of quitting if you use them together: 1. Get ready.  2. Get support and encouragement.  3. Learn new skills and behaviors.  4. Get medicine to reduce your nicotine addiction and use it correctly.  5. Be prepared for relapse or  difficult situations. Be determined to continue trying to quit, even if you do not succeed at first.  1. GET READY  Set a quit date.   Change your environment.   Get rid of ALL cigarettes, ashtrays, matches, and lighters in your home, car, and place of work.   Do not let people smoke in your home.   Review your past attempts to quit. Think about what worked and what did not.   Once you quit, do not smoke. NOT EVEN A PUFF!  2. GET SUPPORT AND ENCOURAGEMENT Studies have shown that you have a better chance of being successful if you have help. You can get support in many ways.  Tell your family, friends, and coworkers that you are going to quit and need their support. Ask them not to smoke around you.   Talk to your caregivers (doctor, dentist, nurse, pharmacist, psychologist, and/or smoking counselor).   Get individual, group, or telephone counseling and support. The more counseling you have, the better your chances are of quitting. Programs are available at local hospitals and health centers. Call your local health department for information about programs in your area.   Spiritual beliefs and practices may help some smokers quit.   Quit meters are small computer programs online or downloadable that keep track of quit statistics, such as amount of "quit-time," cigarettes not smoked, and money saved.   Many smokers find one or more of the many self-help books available useful in helping them quit and stay off tobacco.  3. LEARN NEW SKILLS AND BEHAVIORS  Try to distract yourself from urges to smoke. Talk to someone, go for a walk, or occupy your time with a task.   When you first try to quit, change your routine. Take a different route to work. Drink tea instead of coffee. Eat breakfast in a different place.   Do something to reduce your stress. Take a hot bath, exercise, or read a book.   Plan something enjoyable to do every day. Reward yourself for not smoking.   Explore  interactive web-based programs that specialize in helping you quit.  4. GET MEDICINE AND USE IT CORRECTLY Medicines can help you stop smoking and decrease the urge to smoke. Combining medicine with the above behavioral methods and support can quadruple your chances of successfully quitting smoking. The [place] Food and Drug Administration (FDA) has approved 7 medicines to help you quit smoking. These medicines fall into 3 categories.  Nicotine replacement therapy (delivers nicotine to your body without the negative effects and risks of smoking):   Nicotine gum: Available over-the-counter.   Nicotine lozenges: Available over-the-counter.   Nicotine inhaler: Available by prescription.   Nicotine nasal spray: Available by prescription.   Nicotine skin patches (transdermal): Available by prescription and over-the-counter.   Antidepressant medicine (  helps people abstain from smoking, but how this works is unknown):   Bupropion sustained-release (SR) tablets: Available by prescription.   Nicotinic receptor partial agonist (simulates the effect of nicotine in your brain):   Varenicline tartrate tablets: Available by prescription.   Ask your caregiver for advice about which medicines to use and how to use them. Carefully read the information on the package.   Everyone who is trying to quit may benefit from using a medicine. If you are pregnant or trying to become pregnant, nursing an infant, you are under age 18, or you smoke fewer than 10 cigarettes per day, talk to your caregiver before taking any nicotine replacement medicines.   You should stop using a nicotine replacement product and call your caregiver if you experience nausea, dizziness, weakness, vomiting, fast or irregular heartbeat, mouth problems with the lozenge or gum, or redness or swelling of the skin around the patch that does not go away.   Do not use any other product containing nicotine while using a nicotine replacement  product.   Talk to your caregiver before using these products if you have diabetes, heart disease, asthma, stomach ulcers, you had a recent heart attack, you have high blood pressure that is not controlled with medicine, a history of irregular heartbeat, or you have been prescribed medicine to help you quit smoking.  5. BE PREPARED FOR RELAPSE OR DIFFICULT SITUATIONS  Most relapses occur within the first 3 months after quitting. Do not be discouraged if you start smoking again. Remember, most people try several times before they finally quit.   You may have symptoms of withdrawal because your body is used to nicotine. You may crave cigarettes, be irritable, feel very hungry, cough often, get headaches, or have difficulty concentrating.   The withdrawal symptoms are only temporary. They are strongest when you first quit, but they will go away within 10 to 14 days.  Here are some difficult situations to watch for:  Alcohol. Avoid drinking alcohol. Drinking lowers your chances of successfully quitting.   Caffeine. Try to reduce the amount of caffeine you consume. It also lowers your chances of successfully quitting.   Other smokers. Being around smoking can make you want to smoke. Avoid smokers.   Weight gain. Many smokers will gain weight when they quit, usually less than 10 pounds. Eat a healthy diet and stay active. Do not let weight gain distract you from your main goal, quitting smoking. Some medicines that help you quit smoking may also help delay weight gain. You can always lose the weight gained after you quit.   Bad mood or depression. There are a lot of ways to improve your mood other than smoking.  If you are having problems with any of these situations, talk to your caregiver. SPECIAL SITUATIONS AND CONDITIONS Studies suggest that everyone can quit smoking. Your situation or condition can give you a special reason to quit.  Pregnant women/new mothers: By quitting, you protect your  baby's health and your own.   Hospitalized patients: By quitting, you reduce health problems and help healing.   Heart attack patients: By quitting, you reduce your risk of a second heart attack.   Lung, head, and neck cancer patients: By quitting, you reduce your chance of a second cancer.   Parents of children and adolescents: By quitting, you protect your children from illnesses caused by secondhand smoke.  QUESTIONS TO THINK ABOUT Think about the following questions before you try to stop smoking. You may   want to talk about your answers with your caregiver.  Why do you want to quit?   If you tried to quit in the past, what helped and what did not?   What will be the most difficult situations for you after you quit? How will you plan to handle them?   Who can help you through the tough times? Your family? Friends? Caregiver?   What pleasures do you get from smoking? What ways can you still get pleasure if you quit?  Here are some questions to ask your caregiver:  How can you help me to be successful at quitting?   What medicine do you think would be best for me and how should I take it?   What should I do if I need more help?   What is smoking withdrawal like? How can I get information on withdrawal?  Quitting takes hard work and a lot of effort, but you can quit smoking.  

## 2015-06-06 NOTE — Progress Notes (Signed)
MAGIN MICHON 03/05/1971 [id number]        45 y.o.  [id number]  for annual exam.  Doing well withoutemployees  Past medical history,surgical history, problem list, medications, allergies, family history and social history were all reviewed and documented as reviewed in the EPIC chart.  ROS:  Performed with pertinent positives and negatives included in the history, assessment and plan.   Additional significant findings :  none   Exam: Caryn Bee assistant Filed Vitals:   06/06/15 0921  BP: 124/80  Height: 5\' 7"  (1.702 m)  Weight: 281 lb (127.461 kg)   General appearance:  Normal affect, orientation and appearance. Skin: Grossly normal HEENT: Without gross lesions.  No cervical or supraclavicular adenopathy. Thyroid normal.  Lungs:  Clear without wheezing, rales or rhonchi Cardiac: RR, without RMG Abdominal:  Soft, nontender, without masses, guarding, rebound, organomegaly or hernia Breasts:  Examined lying and sitting without masses, retractions, discharge or axillary adenopathy. Pelvic:  Ext/BUS/vagina normal  Adnexa without masses or tenderness    Anus and perineum normal   Rectovaginal normal sphincter tone without palpated masses or tenderness.    Assessment/Plan:  45 y.o. [id number] female for annual exam.   1. 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 Without significant hot flashes, sweats or other symptoms. Continue to monitor and report any issues. 2. Mammography 05/2015. Continue with annual mammography when due. SBE monthly reviewed. 3. Pap smear 04/2013. No Pap smear done today. No history of significant abnormal Pap smears. Options to stop screening per current screening guidelines based on hysterectomy discussed. Will readdress on an annual basis. 4. Stop smoking strategies reviewed and encouraged. 5. Health maintenance. No routine blood work done as patient has this done elsewhere. Follow up 1 year, sooner as needed.   Anastasio Auerbach MD, 9:49 AM  06/06/2015

## 2015-06-07 LAB — URINALYSIS W MICROSCOPIC + REFLEX CULTURE
BILIRUBIN URINE: NEGATIVE
Bacteria, UA: NONE SEEN [HPF]
Casts: NONE SEEN [LPF]
Crystals: NONE SEEN [HPF]
Hgb urine dipstick: NEGATIVE
Ketones, ur: NEGATIVE
LEUKOCYTES UA: NEGATIVE
NITRITE: NEGATIVE
Protein, ur: NEGATIVE
SPECIFIC GRAVITY, URINE: 1.03 (ref 1.001–1.035)
WBC UA: NONE SEEN WBC/HPF (ref <=5)
Yeast: NONE SEEN [HPF]
pH: 6 (ref 5.0–8.0)

## 2015-06-08 LAB — URINE CULTURE
Colony Count: NO GROWTH
Organism ID, Bacteria: NO GROWTH

## 2016-04-23 ENCOUNTER — Other Ambulatory Visit: Payer: Self-pay | Admitting: Gynecology

## 2016-04-23 DIAGNOSIS — Z1231 Encounter for screening mammogram for malignant neoplasm of breast: Secondary | ICD-10-CM

## 2016-05-12 ENCOUNTER — Emergency Department (HOSPITAL_COMMUNITY)
Admission: EM | Admit: 2016-05-12 | Discharge: 2016-05-13 | Disposition: A | Discharge status: Home / Self Care | Payer: BLUE CROSS/BLUE SHIELD | Attending: Emergency Medicine | Admitting: Emergency Medicine

## 2016-05-12 ENCOUNTER — Encounter (HOSPITAL_COMMUNITY): Payer: Self-pay | Admitting: Emergency Medicine

## 2016-05-12 DIAGNOSIS — F17210 Nicotine dependence, cigarettes, uncomplicated: Secondary | ICD-10-CM | POA: Diagnosis not present

## 2016-05-12 DIAGNOSIS — J069 Acute upper respiratory infection, unspecified: Secondary | ICD-10-CM | POA: Insufficient documentation

## 2016-05-12 DIAGNOSIS — J029 Acute pharyngitis, unspecified: Secondary | ICD-10-CM | POA: Diagnosis not present

## 2016-05-12 DIAGNOSIS — Z91040 Latex allergy status: Secondary | ICD-10-CM | POA: Insufficient documentation

## 2016-05-12 DIAGNOSIS — Z79899 Other long term (current) drug therapy: Secondary | ICD-10-CM | POA: Insufficient documentation

## 2016-05-12 DIAGNOSIS — I10 Essential (primary) hypertension: Secondary | ICD-10-CM | POA: Diagnosis not present

## 2016-05-12 LAB — RAPID STREP SCREEN (MED CTR MEBANE ONLY): Streptococcus, Group A Screen (Direct): NEGATIVE

## 2016-05-12 MED ORDER — OXYMETAZOLINE HCL 0.05 % NA SOLN
2.0000 | Freq: Two times a day (BID) | NASAL | Status: DC
  Administered 2016-05-12: 2 via NASAL
  Filled 2016-05-12: qty 15

## 2016-05-12 MED ORDER — IBUPROFEN 800 MG PO TABS: 800.0000 mg | ORAL_TABLET | Freq: Three times a day (TID) | ORAL | 0 refills | Status: DC

## 2016-05-12 MED ORDER — FLUTICASONE PROPIONATE 50 MCG/ACT NA SUSP
2.0000 | Freq: Every day | NASAL | Status: DC
  Filled 2016-05-12: qty 16

## 2016-05-12 MED ORDER — FLUTICASONE PROPIONATE 50 MCG/ACT NA SUSP: 2.0000 | Freq: Every day | NASAL | 2 refills | Status: DC

## 2016-05-12 MED ORDER — IBUPROFEN 800 MG PO TABS
800.0000 mg | ORAL_TABLET | Freq: Once | ORAL | Status: AC
  Administered 2016-05-12: 800 mg via ORAL
  Filled 2016-05-12: qty 1

## 2016-05-12 NOTE — Discharge Instructions (Signed)
1. Medications: flonase, mucinex, ibuprofen for pain, usual home medications 2. Treatment: rest, drink plenty of fluids, take tylenol or ibuprofen for fever control 3. Follow Up: Please followup with your primary doctor in 3 days for discussion of your diagnoses and further evaluation after today's visit; if you do not have a primary care doctor use the resource guide provided to find one; Return to the ER for high fevers, difficulty breathing or other concerning symptoms

## 2016-05-12 NOTE — ED Triage Notes (Signed)
Pt to ED with c/o sore throat x's 3 days.  Also c/o feeling lightheaded and headache x's 4 days.  Pt denies nausea or vomiting

## 2016-05-12 NOTE — ED Provider Notes (Signed)
[facility] DEPT Provider Note   CSN: [id number] Arrival date & time: 05/12/16  [facility]     History   Chief Complaint Chief Complaint  Patient presents with  . Sore Throat  . Headache    HPI Catherine [name] is a 46 y.o. female with a hx of anxiety, depression presents to the Emergency Department complaining of gradual, persistent, progressively worsening sore throat with associated frontal headache and lightheadedness onset 3 days ago. She reports associated rhinorrhea and frontal sinus congestion.  No treatments PTA.  Eating and drinking makes the symptoms worse.  Pt reports intermittent smokers cough, unchanged from baseline.  Nothing makes them better. Pt denies fever, chills, neck pain, chest pain, SOB, and pain, N/V/D.      The history is provided by the patient and medical records. No language interpreter was used.    Past Medical History:  Diagnosis Date  . Anxiety   . Depression   . Hypertension     There are no active problems to display for this patient.   Past Surgical History:  Procedure Laterality Date  . BUNIONECTOMY  2012  . CESAREAN SECTION     X 2  . LAVH  2009   Leiomyoma and menorrhagia  . MYOMECTOMY  2005    OB History    Gravida Para Term Preterm AB Living   4 2     2 2    SAB TAB Ectopic Multiple Live Births   2               Home Medications    Prior to Admission medications   Medication Sig Start Date End Date Taking? Authorizing Provider  amLODipine (NORVASC) 10 MG tablet Take 10 mg by mouth daily.    Historical Provider, MD  azithromycin (ZITHROMAX) 500 MG tablet Take by mouth daily.    Historical Provider, MD  fluticasone (FLONASE) 50 MCG/ACT nasal spray Place 2 sprays into both nostrils daily. 05/12/16   Viraj Liby, PA-C  gabapentin (NEURONTIN) 300 MG capsule Take 300 mg by mouth daily. 07/31/14   Historical Provider, MD  ibuprofen (ADVIL,MOTRIN) 800 MG tablet Take 1 tablet (800 mg total) by mouth 3 (three) times daily.  05/12/16   Aubreyanna Dorrough, PA-C  losartan (COZAAR) 50 MG tablet Take 50 mg by mouth daily.    Historical Provider, MD  PREDNISONE PO Take by mouth.    Historical Provider, MD  sertraline (ZOLOFT) 100 MG tablet Take 100 mg by mouth daily.    Historical Provider, MD  spironolactone (ALDACTONE) 25 MG tablet Take 25 mg by mouth daily.     Historical Provider, MD    Family History Family History  Problem Relation Age of Onset  . Hypertension Mother   . Hypertension Father   . Hypertension Sister     Social History Social History  Substance Use Topics  . Smoking status: Current Every Day Smoker    Packs/day: 0.25    Types: Cigarettes  . Smokeless tobacco: Never Used  . Alcohol use 0.0 oz/week     Comment: socially     Allergies   Latex   Review of Systems Review of Systems  Constitutional: Positive for activity change.  HENT: Positive for congestion, postnasal drip, rhinorrhea and sore throat.   Respiratory: Positive for cough ( baseline).   Gastrointestinal: Negative for abdominal pain, diarrhea, nausea and vomiting.  Musculoskeletal: Negative for neck pain.  Skin: Negative for rash.  Neurological: Positive for headaches ( frontal).  All other systems reviewed  and are negative.    Physical Exam Updated Vital Signs BP 138/87 (BP Location: Right Arm)   Pulse 87   Temp 98.6 F (37 C) (Oral)   Resp 18   SpO2 97%   Physical Exam  Constitutional: She appears well-developed and well-nourished. No distress.  HENT:  Head: Normocephalic and atraumatic.  Right Ear: Tympanic membrane, external ear and ear canal normal.  Left Ear: Tympanic membrane, external ear and ear canal normal.  Nose: Nose normal. No mucosal edema or rhinorrhea.  Mouth/Throat: Uvula is midline and mucous membranes are normal. Mucous membranes are not dry. No trismus in the jaw. No uvula swelling. Posterior oropharyngeal erythema present. No oropharyngeal exudate, posterior oropharyngeal edema or  tonsillar abscesses.  Posterior oropharynx with erythema, but no edema or exudate on the tonsils  Eyes: Conjunctivae are normal.  Neck: Normal range of motion, full passive range of motion without pain and phonation normal. No tracheal tenderness, no spinous process tenderness and no muscular tenderness present. No neck rigidity. No erythema and normal range of motion present. No Brudzinski's sign and no Kernig's sign noted.  Range of motion without pain  No midline or paraspinal tenderness Normal phonation No stridor Handling secretions without difficulty No nuchal rigidity or meningeal signs  Cardiovascular: Normal rate, regular rhythm and normal heart sounds.   Pulses:      Radial pulses are 2+ on the right side, and 2+ on the left side.  Pulmonary/Chest: Effort normal and breath sounds normal. No stridor. No respiratory distress. She has no decreased breath sounds. She has no wheezes.  Equal chest expansion, clear and equal breath sounds without focal wheezes, rhonchi or rales  Musculoskeletal: Normal range of motion.  Lymphadenopathy:       Head (right side): Submandibular and tonsillar adenopathy present. No submental, no preauricular, no posterior auricular and no occipital adenopathy present.       Head (left side): Submandibular and tonsillar adenopathy present. No submental, no preauricular, no posterior auricular and no occipital adenopathy present.    She has cervical adenopathy.       Right cervical: Superficial cervical adenopathy present. No deep cervical and no posterior cervical adenopathy present.      Left cervical: Superficial cervical adenopathy present. No deep cervical and no posterior cervical adenopathy present.  Neurological: She is alert.  Alert and oriented Moves all extremities without ataxia  Skin: Skin is warm and dry. She is not diaphoretic.  Psychiatric: She has a normal mood and affect.  Nursing note and vitals reviewed.    ED Treatments / Results    Labs (all labs ordered are listed, but only abnormal results are displayed) Labs Reviewed  RAPID STREP SCREEN (NOT AT [facility])  CULTURE, GROUP A STREP [facility])    Procedures Procedures (including critical care time)  Medications Ordered in ED Medications  oxymetazoline (AFRIN) 0.05 % nasal spray 2 spray (2 sprays Each Nare Given 05/12/16 2254)  ibuprofen (ADVIL,MOTRIN) tablet 800 mg (800 mg Oral Given 05/12/16 2254)     Initial Impression / Assessment and Plan / ED Course  I have reviewed the triage vital signs and the nursing notes.  Pertinent labs & imaging results that were available during my care of the patient were reviewed by me and considered in my medical decision making (see chart for details).     Pt afebrile without tonsillar exudate, negative strep. Presents with mild cervical lymphadenopathy, & dysphagia; diagnosis of viral pharyngitis. Patient also with URI symptoms and sinus headache. Symptoms  for less than 14 days and no fever. No abx indicated. DC w symptomatic tx.  Pt does not appear dehydrated, but did discuss importance of water rehydration. Presentation non concerning for PTA or infxn spread to soft tissue. No trismus or uvula deviation. Specific return precautions discussed. Pt able to drink water in ED without difficulty with intact air way. Recommended PCP follow up.   Final Clinical Impressions(s) / ED Diagnoses   Final diagnoses:  Upper respiratory tract infection, unspecified type  Viral pharyngitis    New Prescriptions New Prescriptions   FLUTICASONE (FLONASE) 50 MCG/ACT NASAL SPRAY    Place 2 sprays into both nostrils daily.   IBUPROFEN (ADVIL,MOTRIN) 800 MG TABLET    Take 1 tablet (800 mg total) by mouth 3 (three) times daily.     Jarrett Soho Mikea Quadros, PA-C 05/12/16 [place], MD 05/13/16 [id number]

## 2016-05-15 LAB — CULTURE, GROUP A STREP (THRC)

## 2016-06-01 ENCOUNTER — Ambulatory Visit: Payer: BLUE CROSS/BLUE SHIELD

## 2016-06-08 ENCOUNTER — Encounter: Payer: BLUE CROSS/BLUE SHIELD | Admitting: Gynecology

## 2016-06-22 ENCOUNTER — Ambulatory Visit (INDEPENDENT_AMBULATORY_CARE_PROVIDER_SITE_OTHER): Payer: Self-pay | Admitting: Physician Assistant

## 2016-06-22 ENCOUNTER — Encounter (INDEPENDENT_AMBULATORY_CARE_PROVIDER_SITE_OTHER): Payer: Self-pay | Admitting: Physician Assistant

## 2016-06-22 VITALS — BP 138/90 | HR 71 | Temp 97.9°F | Ht 66.0 in | Wt 293.2 lb

## 2016-06-22 DIAGNOSIS — F329 Major depressive disorder, single episode, unspecified: Secondary | ICD-10-CM

## 2016-06-22 DIAGNOSIS — R632 Polyphagia: Secondary | ICD-10-CM

## 2016-06-22 DIAGNOSIS — R5383 Other fatigue: Secondary | ICD-10-CM

## 2016-06-22 DIAGNOSIS — E669 Obesity, unspecified: Secondary | ICD-10-CM

## 2016-06-22 DIAGNOSIS — F32A Depression, unspecified: Secondary | ICD-10-CM

## 2016-06-22 DIAGNOSIS — I10 Essential (primary) hypertension: Secondary | ICD-10-CM

## 2016-06-22 LAB — POCT GLYCOSYLATED HEMOGLOBIN (HGB A1C): Hemoglobin A1C: 6.3

## 2016-06-22 MED ORDER — LOSARTAN POTASSIUM 50 MG PO TABS: 50.0000 mg | ORAL_TABLET | Freq: Every day | ORAL | 1 refills | Status: DC

## 2016-06-22 MED ORDER — GABAPENTIN 300 MG PO CAPS: 300.0000 mg | ORAL_CAPSULE | Freq: Every day | ORAL | 1 refills | Status: DC

## 2016-06-22 MED ORDER — SPIRONOLACTONE 25 MG PO TABS: 25.0000 mg | ORAL_TABLET | Freq: Every day | ORAL | 1 refills | Status: DC

## 2016-06-22 MED ORDER — SERTRALINE HCL 100 MG PO TABS: 100.0000 mg | ORAL_TABLET | Freq: Every day | ORAL | 1 refills | Status: DC

## 2016-06-22 MED ORDER — AMLODIPINE BESYLATE 10 MG PO TABS: 10.0000 mg | ORAL_TABLET | Freq: Every day | ORAL | 1 refills | Status: DC

## 2016-06-22 MED FILL — SPIRONOLACTONE 25 MG TABLET: 25 | 30 days supply | Qty: 30 | Fill #0

## 2016-06-22 NOTE — Progress Notes (Signed)
BP meds refill, stop Pamabrom Titrate sertraline in future? Sleep study A1C, CMP    Subjective:  Patient ID: Catherine [name], female    DOB: [birth year]  Age: 46 y.o. MRN: [id number]  CC: Blood pressure concerns  HPI Catherine [name] is a 46 y.o. female with a PMH of HTN, Anxiety, and Depression presents to establish new care for her previously stated conditions. She  States that she has not been taking spironolactone but has taken over-the-counter diuretic, Pamabrom.  Ace that she has been found to stop breahing during her sleep. Feels fatigued throughout the day with the need to take naps. Has never had a sleep study conducted.  Her last A1c was 6.4 approximately 2 years ago. She also takes sertraline for anxiety and depression. Takes Sertraline as directed. Feels her depression and anxiety are somewhat controlled but does endorse some worry about not being employed. Palpitations at times that she has panic attacks but reports not having panic attacks since taking Sertraline. She has a job interview tomorrow morning. Denies chest pain, shortness of breath, abdominal pain, fever, chills, nausea, vomiting, rash, or GI/GU sysptoms.  Outpatient Medications Prior to Visit  Medication Sig Dispense Refill  . amLODipine (NORVASC) 10 MG tablet Take 10 mg by mouth daily.    Marland Kitchen losartan (COZAAR) 50 MG tablet Take 50 mg by mouth daily.    . sertraline (ZOLOFT) 100 MG tablet Take 100 mg by mouth daily.    Marland Kitchen azithromycin (ZITHROMAX) 500 MG tablet Take by mouth daily.    . fluticasone (FLONASE) 50 MCG/ACT nasal spray Place 2 sprays into both nostrils daily. (Patient not taking: Reported on 06/22/2016) 9.9 g 2  . gabapentin (NEURONTIN) 300 MG capsule Take 300 mg by mouth daily.  0  . ibuprofen (ADVIL,MOTRIN) 800 MG tablet Take 1 tablet (800 mg total) by mouth 3 (three) times daily. (Patient not taking: Reported on 06/22/2016) 21 tablet 0  . PREDNISONE PO Take by mouth.    . spironolactone (ALDACTONE) 25 MG tablet Take  25 mg by mouth daily.      No facility-administered medications prior to visit.      ROS Review of Systems  Constitutional: Negative for chills, fever and malaise/fatigue.  Eyes: Negative for blurred vision.  Respiratory: Negative for shortness of breath.   Cardiovascular: Negative for chest pain and palpitations (previous to taking sertraline for anxiety).  Gastrointestinal: Negative for abdominal pain and nausea.  Genitourinary: Negative for dysuria and hematuria.  Musculoskeletal: Negative for joint pain and myalgias.  Skin: Negative for rash.  Neurological: Negative for tingling and headaches.  Psychiatric/Behavioral: Negative for depression. The patient is not nervous/anxious.     Objective:  BP 138/90 (BP Location: Right Arm, Patient Position: Sitting, Cuff Size: Large)   Pulse 71   Temp 97.9 F (36.6 C) (Oral)   Ht 5\' 6"  (1.676 m)   Wt 293 lb 3.2 oz (133 kg)   SpO2 98%   BMI 47.32 kg/m   BP/Weight 06/22/2016 05/12/2016 [id number]  Systolic BP 0000000 [id number]  Diastolic BP 90 84 80  Wt. (Lbs) 293.2 - 281  BMI 47.32 - 44      Physical Exam  Constitutional: She is oriented to person, place, and time.  Obese, NAD, polite  HENT:  Head: Normocephalic and atraumatic.  Eyes: No scleral icterus.  Neck: Normal range of motion. Neck supple. No thyromegaly present.  Cardiovascular: Normal rate, regular rhythm and normal heart sounds.   Pulmonary/Chest: Effort normal and breath sounds  normal.  Abdominal: Soft. Bowel sounds are normal. There is no tenderness.  Musculoskeletal: She exhibits no edema.  Neurological: She is alert and oriented to person, place, and time.  Skin: Skin is warm and dry. No rash noted. No erythema. No pallor.  Psychiatric: She has a normal mood and affect. Her behavior is normal. Thought content normal.  Vitals reviewed.    Assessment & Plan:   1. Hypertension, unspecified type -Restart Spironolactone - CBC with Differential/Platelet -  Comprehensive metabolic panel - TSH - Lipid panel  2. Fatigue, unspecified type - OSA and/or depression  - TSH - Ambulatory referral to Sleep Studies  3. Depression, unspecified depression type -PHQ9 score 15 -Stay on current dose of Sertraline. Reassess in one month after patient attains employment. If no employment attained then will consider further pharmacotherapy.  4. Polyphagia - HgB A1c  5. Obesity, unspecified classification, unspecified obesity type, unspecified whether serious comorbidity present - BMI 47.3. Need to assess thyroid function and send for sleep study before further action is taken. - TSH - Lipid panel - Ambulatory referral to Sleep Studies   Meds ordered this encounter  Medications  . amLODipine (NORVASC) 10 MG tablet    Sig: Take 1 tablet (10 mg total) by mouth daily.    Dispense:  90 tablet    Refill:  1    Order Specific Question:   Supervising Provider    Answer:   Tresa Garter [id number]  . losartan (COZAAR) 50 MG tablet    Sig: Take 1 tablet (50 mg total) by mouth daily.    Dispense:  90 tablet    Refill:  1    Order Specific Question:   Supervising Provider    Answer:   Tresa Garter [id number]  . spironolactone (ALDACTONE) 25 MG tablet    Sig: Take 1 tablet (25 mg total) by mouth daily.    Dispense:  90 tablet    Refill:  1    Order Specific Question:   Supervising Provider    Answer:   Tresa Garter [id number]  . sertraline (ZOLOFT) 100 MG tablet    Sig: Take 1 tablet (100 mg total) by mouth daily.    Dispense:  90 tablet    Refill:  1    Order Specific Question:   Supervising Provider    Answer:   Tresa Garter [id number]  . gabapentin (NEURONTIN) 300 MG capsule    Sig: Take 1 capsule (300 mg total) by mouth daily.    Dispense:  30 capsule    Refill:  1    Order Specific Question:   Supervising Provider    Answer:   Tresa Garter [id number]    Follow-up: Return in about 4 weeks (around 07/20/2016)  for Follow up htn.   Clent Demark PA

## 2016-06-22 NOTE — Patient Instructions (Signed)
DASH Eating Plan DASH stands for "Dietary Approaches to Stop Hypertension." The DASH eating plan is a healthy eating plan that has been shown to reduce high blood pressure (hypertension). It may also reduce your risk for type 2 diabetes, heart disease, and stroke. The DASH eating plan may also help with weight loss. What are tips for following this plan? General guidelines   Avoid eating more than 2,300 mg (milligrams) of salt (sodium) a day. If you have hypertension, you may need to reduce your sodium intake to 1,500 mg a day.  Limit alcohol intake to no more than 1 drink a day for nonpregnant women and 2 drinks a day for men. One drink equals 12 oz of beer, 5 oz of wine, or 1 oz of hard liquor.  Work with your health care provider to maintain a healthy body weight or to lose weight. Ask what an ideal weight is for you.  Get at least 30 minutes of exercise that causes your heart to beat faster (aerobic exercise) most days of the week. Activities may include walking, swimming, or biking.  Work with your health care provider or diet and nutrition specialist (dietitian) to adjust your eating plan to your individual calorie needs. Reading food labels   Check food labels for the amount of sodium per serving. Choose foods with less than 5 percent of the Daily Value of sodium. Generally, foods with less than 300 mg of sodium per serving fit into this eating plan.  To find whole grains, look for the word "whole" as the first word in the ingredient list. Shopping   Buy products labeled as "low-sodium" or "no salt added."  Buy fresh foods. Avoid canned foods and premade or frozen meals. Cooking   Avoid adding salt when cooking. Use salt-free seasonings or herbs instead of table salt or sea salt. Check with your health care provider or pharmacist before using salt substitutes.  Do not fry foods. Cook foods using healthy methods such as baking, boiling, grilling, and broiling instead.  Cook with  heart-healthy oils, such as olive, canola, soybean, or sunflower oil. Meal planning    Eat a balanced diet that includes:  5 or more servings of fruits and vegetables each day. At each meal, try to fill half of your plate with fruits and vegetables.  Up to 6-8 servings of whole grains each day.  Less than 6 oz of lean meat, poultry, or fish each day. A 3-oz serving of meat is about the same size as a deck of cards. One egg equals 1 oz.  2 servings of low-fat dairy each day.  A serving of nuts, seeds, or beans 5 times each week.  Heart-healthy fats. Healthy fats called Omega-3 fatty acids are found in foods such as flaxseeds and coldwater fish, like sardines, salmon, and mackerel.  Limit how much you eat of the following:  Canned or prepackaged foods.  Food that is high in trans fat, such as fried foods.  Food that is high in saturated fat, such as fatty meat.  Sweets, desserts, sugary drinks, and other foods with added sugar.  Full-fat dairy products.  Do not salt foods before eating.  Try to eat at least 2 vegetarian meals each week.  Eat more home-cooked food and less restaurant, buffet, and fast food.  When eating at a restaurant, ask that your food be prepared with less salt or no salt, if possible. What foods are recommended? The items listed may not be a complete list. Talk   with your dietitian about what dietary choices are best for you. Grains  Whole-grain or whole-wheat bread. Whole-grain or whole-wheat pasta. Brown rice. Catherine [name]. Bulgur. Whole-grain and low-sodium cereals. Pita bread. Low-fat, low-sodium crackers. Whole-wheat flour tortillas. Vegetables  Fresh or frozen vegetables (raw, steamed, roasted, or grilled). Low-sodium or reduced-sodium tomato and vegetable juice. Low-sodium or reduced-sodium tomato sauce and tomato paste. Low-sodium or reduced-sodium canned vegetables. Fruits  All fresh, dried, or frozen fruit. Canned fruit in natural juice  (without added sugar). Meat and other protein foods  Skinless chicken or [place]. Ground chicken or [place]. Pork with fat trimmed off. Fish and seafood. Egg whites. Dried beans, peas, or lentils. Unsalted nuts, nut butters, and seeds. Unsalted canned beans. Lean cuts of beef with fat trimmed off. Low-sodium, lean deli meat. Dairy  Low-fat (1%) or fat-free (skim) milk. Fat-free, low-fat, or reduced-fat cheeses. Nonfat, low-sodium ricotta or cottage cheese. Low-fat or nonfat yogurt. Low-fat, low-sodium cheese. Fats and oils  Soft margarine without trans fats. Vegetable oil. Low-fat, reduced-fat, or light mayonnaise and salad dressings (reduced-sodium). Canola, safflower, olive, soybean, and sunflower oils. Avocado. Seasoning and other foods  Herbs. Spices. Seasoning mixes without salt. Unsalted popcorn and pretzels. Fat-free sweets. What foods are not recommended? The items listed may not be a complete list. Talk with your dietitian about what dietary choices are best for you. Grains  Baked goods made with fat, such as croissants, muffins, or some breads. Dry pasta or rice meal packs. Vegetables  Creamed or fried vegetables. Vegetables in a cheese sauce. Regular canned vegetables (not low-sodium or reduced-sodium). Regular canned tomato sauce and paste (not low-sodium or reduced-sodium). Regular tomato and vegetable juice (not low-sodium or reduced-sodium). Catherine [name]. Olives. Fruits  Canned fruit in a light or heavy syrup. Fried fruit. Fruit in cream or butter sauce. Meat and other protein foods  Fatty cuts of meat. Ribs. Fried meat. Catherine [name]. Sausage. Bologna and other processed lunch meats. Salami. Fatback. Hotdogs. Bratwurst. Salted nuts and seeds. Canned beans with added salt. Canned or smoked fish. Whole eggs or egg yolks. Chicken or [place] with skin. Dairy  Whole or 2% milk, cream, and half-and-half. Whole or full-fat cream cheese. Whole-fat or sweetened yogurt. Full-fat cheese. Nondairy creamers.  Whipped toppings. Processed cheese and cheese spreads. Fats and oils  Butter. Stick margarine. Lard. Shortening. Ghee. Bacon fat. Tropical oils, such as coconut, palm kernel, or palm oil. Seasoning and other foods  Salted popcorn and pretzels. Onion salt, garlic salt, seasoned salt, table salt, and sea salt. Worcestershire sauce. Tartar sauce. Barbecue sauce. Teriyaki sauce. Soy sauce, including reduced-sodium. Steak sauce. Canned and packaged gravies. Fish sauce. Oyster sauce. Cocktail sauce. Horseradish that you find on the shelf. Ketchup. Mustard. Meat flavorings and tenderizers. Bouillon cubes. Hot sauce and Tabasco sauce. Premade or packaged marinades. Premade or packaged taco seasonings. Relishes. Regular salad dressings. Where to find more information:  National Heart, Lung, and [place]: https://wilson-eaton.com/  American Heart Association: www.heart.org Summary  The DASH eating plan is a healthy eating plan that has been shown to reduce high blood pressure (hypertension). It may also reduce your risk for type 2 diabetes, heart disease, and stroke.  With the DASH eating plan, you should limit salt (sodium) intake to 2,300 mg a day. If you have hypertension, you may need to reduce your sodium intake to 1,500 mg a day.  When on the DASH eating plan, aim to eat more fresh fruits and vegetables, whole grains, lean proteins, low-fat dairy, and heart-healthy fats.  Work  with your health care provider or diet and nutrition specialist (dietitian) to adjust your eating plan to your individual calorie needs. This information is not intended to replace advice given to you by your health care provider. Make sure you discuss any questions you have with your health care provider. Document Released: 03/26/2011 Document Revised: 03/30/2016 Document Reviewed: 03/30/2016 Elsevier Interactive Patient Education  2017 [employer] Hypertension Hypertension, commonly called high blood pressure, is when  the force of blood pumping through the arteries is too strong. The arteries are the blood vessels that carry blood from the heart throughout the body. Hypertension forces the heart to work harder to pump blood and may cause arteries to become narrow or stiff. Having untreated or uncontrolled hypertension can cause heart attacks, strokes, kidney disease, and other problems. A blood pressure reading consists of a higher number over a lower number. Ideally, your blood pressure should be below 120/80. The first ("top") number is called the systolic pressure. It is a measure of the pressure in your arteries as your heart beats. The second ("bottom") number is called the diastolic pressure. It is a measure of the pressure in your arteries as the heart relaxes. What are the causes? The cause of this condition is not known. What increases the risk? Some risk factors for high blood pressure are under your control. Others are not. Factors you can change   Smoking.  Having type 2 diabetes mellitus, high cholesterol, or both.  Not getting enough exercise or physical activity.  Being overweight.  Having too much fat, sugar, calories, or salt (sodium) in your diet.  Drinking too much alcohol. Factors that are difficult or impossible to change   Having chronic kidney disease.  Having a family history of high blood pressure.  Age. Risk increases with age.  Race. You may be at higher risk if you are African-American.  Gender. Men are at higher risk than women before age 45. After age 65, women are at higher risk than men.  Having obstructive sleep apnea.  Stress. What are the signs or symptoms? Extremely high blood pressure (hypertensive crisis) may cause:  Headache.  Anxiety.  Shortness of breath.  Nosebleed.  Nausea and vomiting.  Severe chest pain.  Jerky movements you cannot control (seizures). How is this diagnosed? This condition is diagnosed by measuring your blood pressure  while you are seated, with your arm resting on a surface. The cuff of the blood pressure monitor will be placed directly against the skin of your upper arm at the level of your heart. It should be measured at least twice using the same arm. Certain conditions can cause a difference in blood pressure between your right and left arms. Certain factors can cause blood pressure readings to be lower or higher than normal (elevated) for a short period of time:  When your blood pressure is higher when you are in a health care provider's office than when you are at home, this is called white coat hypertension. Most people with this condition do not need medicines.  When your blood pressure is higher at home than when you are in a health care provider's office, this is called masked hypertension. Most people with this condition may need medicines to control blood pressure. If you have a high blood pressure reading during one visit or you have normal blood pressure with other risk factors:  You may be asked to return on a different day to have your blood pressure checked again.  You may   be asked to monitor your blood pressure at home for 1 week or longer. If you are diagnosed with hypertension, you may have other blood or imaging tests to help your health care provider understand your overall risk for other conditions. How is this treated? This condition is treated by making healthy lifestyle changes, such as eating healthy foods, exercising more, and reducing your alcohol intake. Your health care provider may prescribe medicine if lifestyle changes are not enough to get your blood pressure under control, and if:  Your systolic blood pressure is above 130.  Your diastolic blood pressure is above 80. Your personal target blood pressure may vary depending on your medical conditions, your age, and other factors. Follow these instructions at home: Eating and drinking   Eat a diet that is high in fiber and  potassium, and low in sodium, added sugar, and fat. An example eating plan is called the DASH (Dietary Approaches to Stop Hypertension) diet. To eat this way:  Eat plenty of fresh fruits and vegetables. Try to fill half of your plate at each meal with fruits and vegetables.  Eat whole grains, such as whole wheat pasta, brown rice, or whole grain bread. Fill about one quarter of your plate with whole grains.  Eat or drink low-fat dairy products, such as skim milk or low-fat yogurt.  Avoid fatty cuts of meat, processed or cured meats, and poultry with skin. Fill about one quarter of your plate with lean proteins, such as fish, chicken without skin, beans, eggs, and tofu.  Avoid premade and processed foods. These tend to be higher in sodium, added sugar, and fat.  Reduce your daily sodium intake. Most people with hypertension should eat less than 1,500 mg of sodium a day.  Limit alcohol intake to no more than 1 drink a day for nonpregnant women and 2 drinks a day for men. One drink equals 12 oz of beer, 5 oz of wine, or 1 oz of hard liquor. Lifestyle   Work with your health care provider to maintain a healthy body weight or to lose weight. Ask what an ideal weight is for you.  Get at least 30 minutes of exercise that causes your heart to beat faster (aerobic exercise) most days of the week. Activities may include walking, swimming, or biking.  Include exercise to strengthen your muscles (resistance exercise), such as pilates or lifting weights, as part of your weekly exercise routine. Try to do these types of exercises for 30 minutes at least 3 days a week.  Do not use any products that contain nicotine or tobacco, such as cigarettes and e-cigarettes. If you need help quitting, ask your health care provider.  Monitor your blood pressure at home as told by your health care provider.  Keep all follow-up visits as told by your health care provider. This is important. Medicines   Take  over-the-counter and prescription medicines only as told by your health care provider. Follow directions carefully. Blood pressure medicines must be taken as prescribed.  Do not skip doses of blood pressure medicine. Doing this puts you at risk for problems and can make the medicine less effective.  Ask your health care provider about side effects or reactions to medicines that you should watch for. Contact a health care provider if:  You think you are having a reaction to a medicine you are taking.  You have headaches that keep coming back (recurring).  You feel dizzy.  You have swelling in your ankles.  You  have trouble with your vision. Get help right away if:  You develop a severe headache or confusion.  You have unusual weakness or numbness.  You feel faint.  You have severe pain in your chest or abdomen.  You vomit repeatedly.  You have trouble breathing. Summary  Hypertension is when the force of blood pumping through your arteries is too strong. If this condition is not controlled, it may put you at risk for serious complications.  Your personal target blood pressure may vary depending on your medical conditions, your age, and other factors. For most people, a normal blood pressure is less than 120/80.  Hypertension is treated with lifestyle changes, medicines, or a combination of both. Lifestyle changes include weight loss, eating a healthy, low-sodium diet, exercising more, and limiting alcohol. This information is not intended to replace advice given to you by your health care provider. Make sure you discuss any questions you have with your health care provider. Document Released: 04/06/2005 Document Revised: 03/04/2016 Document Reviewed: 03/04/2016 Elsevier Interactive Patient Education  2017 [employer] Inc[employer]  

## 2016-06-23 LAB — CBC WITH DIFFERENTIAL/PLATELET
Basophils Absolute: 0.1 10*3/uL (ref 0.0–0.2)
Basos: 1 %
EOS (ABSOLUTE): 0.6 10*3/uL — ABNORMAL HIGH (ref 0.0–0.4)
Eos: 8 %
Hematocrit: 38.6 % (ref 34.0–46.6)
Hemoglobin: 12.6 g/dL (ref 11.1–15.9)
Immature Grans (Abs): 0 10*3/uL (ref 0.0–0.1)
Immature Granulocytes: 0 %
Lymphocytes Absolute: 3.2 10*3/uL — ABNORMAL HIGH (ref 0.7–3.1)
Lymphs: 41 %
MCH: 29.8 pg (ref 26.6–33.0)
MCHC: 32.6 g/dL (ref 31.5–35.7)
MCV: 91 fL (ref 79–97)
MONOS ABS: 0.7 10*3/uL (ref 0.1–0.9)
Monocytes: 9 %
Neutrophils Absolute: 3.1 10*3/uL (ref 1.4–7.0)
Neutrophils: 41 %
Platelets: 271 10*3/uL (ref 150–379)
RBC: 4.23 x10E6/uL (ref 3.77–5.28)
RDW: 14.8 % (ref 12.3–15.4)
WBC: 7.6 10*3/uL (ref 3.4–10.8)

## 2016-06-23 LAB — COMPREHENSIVE METABOLIC PANEL
A/G RATIO: 1.7 (ref 1.2–2.2)
ALT: 26 IU/L (ref 0–32)
AST: 28 IU/L (ref 0–40)
Albumin: 4.3 g/dL (ref 3.5–5.5)
Alkaline Phosphatase: 73 IU/L (ref 39–117)
BILIRUBIN TOTAL: 0.3 mg/dL (ref 0.0–1.2)
BUN/Creatinine Ratio: 22 (ref 9–23)
BUN: 15 mg/dL (ref 6–24)
CHLORIDE: 102 mmol/L (ref 96–106)
CO2: 27 mmol/L (ref 18–29)
Calcium: 9.1 mg/dL (ref 8.7–10.2)
Creatinine, Ser: 0.67 mg/dL (ref 0.57–1.00)
GFR calc non Af Amer: 106 mL/min/{1.73_m2} (ref >59)
GFR, EST AFRICAN AMERICAN: 122 mL/min/{1.73_m2} (ref >59)
Globulin, Total: 2.5 g/dL (ref 1.5–4.5)
Glucose: 132 mg/dL — ABNORMAL HIGH (ref 65–99)
POTASSIUM: 4.1 mmol/L (ref 3.5–5.2)
Sodium: 141 mmol/L (ref 134–144)
TOTAL PROTEIN: 6.8 g/dL (ref 6.0–8.5)

## 2016-06-23 LAB — LIPID PANEL
Chol/HDL Ratio: 3.2 ratio units (ref 0.0–4.4)
Cholesterol, Total: 155 mg/dL (ref 100–199)
HDL: 48 mg/dL (ref >39)
LDL Calculated: 96 mg/dL (ref 0–99)
Triglycerides: 56 mg/dL (ref 0–149)
VLDL Cholesterol Cal: 11 mg/dL (ref 5–40)

## 2016-06-23 LAB — TSH: TSH: 2.37 u[IU]/mL (ref 0.450–4.500)

## 2016-06-28 ENCOUNTER — Encounter (HOSPITAL_COMMUNITY): Payer: Self-pay | Admitting: Emergency Medicine

## 2016-06-28 ENCOUNTER — Other Ambulatory Visit: Payer: Self-pay

## 2016-06-28 ENCOUNTER — Emergency Department (HOSPITAL_COMMUNITY): Payer: Self-pay

## 2016-06-28 ENCOUNTER — Emergency Department (HOSPITAL_COMMUNITY)
Admission: EM | Admit: 2016-06-28 | Discharge: 2016-06-28 | Disposition: A | Discharge status: Home / Self Care | Payer: Self-pay | Attending: Emergency Medicine | Admitting: Emergency Medicine

## 2016-06-28 DIAGNOSIS — J069 Acute upper respiratory infection, unspecified: Secondary | ICD-10-CM | POA: Insufficient documentation

## 2016-06-28 DIAGNOSIS — Z79899 Other long term (current) drug therapy: Secondary | ICD-10-CM | POA: Insufficient documentation

## 2016-06-28 DIAGNOSIS — R062 Wheezing: Secondary | ICD-10-CM | POA: Insufficient documentation

## 2016-06-28 DIAGNOSIS — R05 Cough: Secondary | ICD-10-CM

## 2016-06-28 DIAGNOSIS — I10 Essential (primary) hypertension: Secondary | ICD-10-CM | POA: Insufficient documentation

## 2016-06-28 DIAGNOSIS — F17210 Nicotine dependence, cigarettes, uncomplicated: Secondary | ICD-10-CM | POA: Insufficient documentation

## 2016-06-28 DIAGNOSIS — Z91040 Latex allergy status: Secondary | ICD-10-CM | POA: Insufficient documentation

## 2016-06-28 DIAGNOSIS — R072 Precordial pain: Secondary | ICD-10-CM | POA: Insufficient documentation

## 2016-06-28 DIAGNOSIS — R059 Cough, unspecified: Secondary | ICD-10-CM

## 2016-06-28 HISTORY — DX: Bronchitis, not specified as acute or chronic: J40

## 2016-06-28 LAB — BASIC METABOLIC PANEL
ANION GAP: 9 (ref 5–15)
BUN: 12 mg/dL (ref 6–20)
CALCIUM: 9.3 mg/dL (ref 8.9–10.3)
CO2: 25 mmol/L (ref 22–32)
CREATININE: 0.69 mg/dL (ref 0.44–1.00)
Chloride: 102 mmol/L (ref 101–111)
GFR calc non Af Amer: >60 mL/min (ref >60)
Glucose, Bld: 122 mg/dL — ABNORMAL HIGH (ref 65–99)
Potassium: 3.9 mmol/L (ref 3.5–5.1)
Sodium: 136 mmol/L (ref 135–145)

## 2016-06-28 LAB — CBC
HCT: 41.1 % (ref 36.0–46.0)
Hemoglobin: 13.2 g/dL (ref 12.0–15.0)
MCH: 30.1 pg (ref 26.0–34.0)
MCHC: 32.1 g/dL (ref 30.0–36.0)
MCV: 93.6 fL (ref 78.0–100.0)
PLATELETS: 258 10*3/uL (ref 150–400)
RBC: 4.39 MIL/uL (ref 3.87–5.11)
RDW: 14.3 % (ref 11.5–15.5)
WBC: 9.4 10*3/uL (ref 4.0–10.5)

## 2016-06-28 LAB — I-STAT TROPONIN, ED: TROPONIN I, POC: 0 ng/mL (ref 0.00–0.08)

## 2016-06-28 MED ORDER — ALBUTEROL SULFATE HFA 108 (90 BASE) MCG/ACT IN AERS
2.0000 | INHALATION_SPRAY | RESPIRATORY_TRACT | Status: DC | PRN
  Administered 2016-06-28: 2 via RESPIRATORY_TRACT
  Filled 2016-06-28: qty 6.7

## 2016-06-28 MED ORDER — PREDNISONE 20 MG PO TABS
60.0000 mg | ORAL_TABLET | Freq: Once | ORAL | Status: AC
  Administered 2016-06-28: 60 mg via ORAL
  Filled 2016-06-28: qty 3

## 2016-06-28 MED ORDER — IPRATROPIUM BROMIDE 0.02 % IN SOLN
0.5000 mg | Freq: Once | RESPIRATORY_TRACT | Status: AC
  Administered 2016-06-28: 0.5 mg via RESPIRATORY_TRACT
  Filled 2016-06-28: qty 2.5

## 2016-06-28 MED ORDER — DICLOFENAC SODIUM 75 MG PO TBEC: 75.0000 mg | DELAYED_RELEASE_TABLET | Freq: Two times a day (BID) | ORAL | 0 refills | Status: DC

## 2016-06-28 MED ORDER — ALBUTEROL SULFATE (2.5 MG/3ML) 0.083% IN NEBU
5.0000 mg | INHALATION_SOLUTION | Freq: Once | RESPIRATORY_TRACT | Status: AC
  Administered 2016-06-28: 5 mg via RESPIRATORY_TRACT
  Filled 2016-06-28: qty 6

## 2016-06-28 MED ORDER — BENZONATATE 100 MG PO CAPS: 100.0000 mg | ORAL_CAPSULE | Freq: Three times a day (TID) | ORAL | 0 refills | Status: DC

## 2016-06-28 MED ORDER — AEROCHAMBER PLUS W/MASK MISC
1.0000 | Freq: Once | Status: DC
  Filled 2016-06-28 (×2): qty 1

## 2016-06-28 MED ORDER — HYDROCODONE-ACETAMINOPHEN 5-325 MG PO TABS
1.0000 | ORAL_TABLET | Freq: Once | ORAL | Status: AC
  Administered 2016-06-28: 1 via ORAL
  Filled 2016-06-28: qty 1

## 2016-06-28 MED ORDER — PREDNISONE 20 MG PO TABS
40.0000 mg | ORAL_TABLET | Freq: Every day | ORAL | 0 refills | Status: DC
Start: 2016-06-28 — End: 2016-07-29

## 2016-06-28 MED ORDER — FLUTICASONE PROPIONATE 50 MCG/ACT NA SUSP: 2.0000 | Freq: Every day | NASAL | 2 refills | Status: DC

## 2016-06-28 NOTE — Discharge Instructions (Signed)
1. Medications: flonase, mucinex, tessalon, albuterol, usual home medications °2. Treatment: rest, drink plenty of fluids, take tylenol or ibuprofen for fever control °3. Follow Up: Please followup with your primary doctor in 3 days for discussion of your diagnoses and further evaluation after today's visit; if you do not have a primary care doctor use the resource guide provided to find one; Return to the ER for high fevers, difficulty breathing or other concerning symptoms ° °

## 2016-06-28 NOTE — ED Provider Notes (Signed)
[facility] DEPT Provider Note   CSN: [id number] Arrival date & time: 06/28/16  0359     History   Chief Complaint Chief Complaint  Patient presents with  . Chest Pain    HPI Catherine [name] is a 46 y.o. female with a hx of HTN, depression, anxiety presents to the Emergency Department complaining of gradual, persistent, progressively worsening central chest pain onset 1:30am.  It did not wake pt from sleep.  Patient describes the pain as a soreness. It is rated at a 5/10. Associated symptoms include cough, congestion.  Pt with URI last week which has improved, but the cough remains.  Pt reports she had an anxiety attack tonight before her chest pain began.  Pt is a smoker, smoking 1/2 ppd.  Nothing makes it better and cough makes it worse.  Pt denies fever, HA, N/V, abd pain, palpitations, hemoptysis, weakness, numbness, syncope.  Pt denies personal cardiac hx.  Father died of MI in his 45s.  Patient denies recent travel, immobilization, surgery, fractures, history of PE and history of cancer. She denies swelling in her legs, dyspnea on exertion or orthopnea.  Patient denies drug abuse including cocaine.   The history is provided by the patient and medical records. No language interpreter was used.    Past Medical History:  Diagnosis Date  . Anxiety   . Bronchitis   . Depression   . Hypertension     There are no active problems to display for this patient.   Past Surgical History:  Procedure Laterality Date  . BUNIONECTOMY  2012  . CESAREAN SECTION     X 2  . LAVH  2009   Leiomyoma and menorrhagia  . MYOMECTOMY  2005    OB History    Gravida Para Term Preterm AB Living   4 2     2 2    SAB TAB Ectopic Multiple Live Births   2               Home Medications    Prior to Admission medications   Medication Sig Start Date End Date Taking? Authorizing Provider  amLODipine (NORVASC) 10 MG tablet Take 1 tablet (10 mg total) by mouth daily. 06/22/16  Yes Roger Roderic Ovens, PA-C  losartan (COZAAR) 50 MG tablet Take 1 tablet (50 mg total) by mouth daily. 06/22/16  Yes Roger Roderic Ovens, PA-C  sertraline (ZOLOFT) 100 MG tablet Take 1 tablet (100 mg total) by mouth daily. 06/22/16  Yes Roger Roderic Ovens, PA-C  spironolactone (ALDACTONE) 25 MG tablet Take 1 tablet (25 mg total) by mouth daily. 06/22/16  Yes Roger Roderic Ovens, PA-C  benzonatate (TESSALON) 100 MG capsule Take 1 capsule (100 mg total) by mouth every 8 (eight) hours. 06/28/16   Mattew Chriswell, PA-C  diclofenac (VOLTAREN) 75 MG EC tablet Take 1 tablet (75 mg total) by mouth 2 (two) times daily. 06/28/16   Easton Sivertson, PA-C  fluticasone (FLONASE) 50 MCG/ACT nasal spray Place 2 sprays into both nostrils daily. 06/28/16   Aimee Timmons, PA-C  gabapentin (NEURONTIN) 300 MG capsule Take 1 capsule (300 mg total) by mouth daily. Patient not taking: Reported on 06/28/2016 06/22/16   Clent Demark, PA-C  predniSONE (DELTASONE) 20 MG tablet Take 2 tablets (40 mg total) by mouth daily. 06/28/16   Jarrett Soho Trinty Marken, PA-C    Family History Family History  Problem Relation Age of Onset  . Hypertension Mother   . Hypertension Father   . Hypertension Sister  Social History Social History  Substance Use Topics  . Smoking status: Current Every Day Smoker    Packs/day: 0.25    Types: Cigarettes  . Smokeless tobacco: Never Used  . Alcohol use 0.0 oz/week     Comment: socially     Allergies   Latex   Review of Systems Review of Systems  Constitutional: Negative for chills and fever.  HENT: Positive for congestion ( Resolved), rhinorrhea ( Resolved) and sore throat ( Resolved).   Respiratory: Positive for cough, chest tightness and shortness of breath.   Cardiovascular: Positive for chest pain.  All other systems reviewed and are negative.    Physical Exam Updated Vital Signs BP 124/74   Pulse 78   Temp 98.2 F (36.8 C) (Oral)   Resp 15   SpO2 98%   Physical Exam    Constitutional: She appears well-developed and well-nourished. No distress.  Awake, alert, nontoxic appearance  HENT:  Head: Normocephalic and atraumatic.  Right Ear: Tympanic membrane, external ear and ear canal normal.  Left Ear: Tympanic membrane, external ear and ear canal normal.  Nose: Mucosal edema and rhinorrhea present. No epistaxis. Right sinus exhibits no maxillary sinus tenderness and no frontal sinus tenderness. Left sinus exhibits no maxillary sinus tenderness and no frontal sinus tenderness.  Mouth/Throat: Uvula is midline, oropharynx is clear and moist and mucous membranes are normal. Mucous membranes are not pale and not cyanotic. No oropharyngeal exudate, posterior oropharyngeal edema, posterior oropharyngeal erythema or tonsillar abscesses.  Eyes: Conjunctivae are normal. Pupils are equal, round, and reactive to light. No scleral icterus.  Neck: Normal range of motion and full passive range of motion without pain. Neck supple.  Cardiovascular: Normal rate, regular rhythm and intact distal pulses.   Pulmonary/Chest: Effort normal. No stridor. No respiratory distress. She has decreased breath sounds. She has wheezes (expiratory wheezes throughout ).  Equal chest expansion  Abdominal: Soft. Bowel sounds are normal. She exhibits no mass. There is no tenderness. There is no rebound and no guarding.  Musculoskeletal: Normal range of motion. She exhibits no edema.  Lymphadenopathy:    She has no cervical adenopathy.  Neurological: She is alert.  Speech is clear and goal oriented Moves extremities without ataxia  Skin: Skin is warm and dry. No rash noted. She is not diaphoretic.  Psychiatric: She has a normal mood and affect.  Nursing note and vitals reviewed.    ED Treatments / Results  Labs (all labs ordered are listed, but only abnormal results are displayed) Labs Reviewed  BASIC METABOLIC PANEL - Abnormal; Notable for the following:       Result Value   Glucose, Bld  122 (*)    All other components within normal limits  CBC  I-STAT TROPOININ, ED    EKG  EKG Interpretation  Date/Time:  Sunday June 28 2016 03:04:39 EDT Ventricular Rate:  84 PR Interval:  160 QRS Duration: 84 QT Interval:  378 QTC Calculation: 446 R Axis:   45 Text Interpretation:  Normal sinus rhythm Normal ECG Confirmed by HORTON  MD, COURTNEY ([id number]) on 06/28/2016 8:21:07 AM        Radiology Dg Chest 2 View  Result Date: 06/28/2016 CLINICAL DATA:  Right-sided chest pain and dyspnea for 2 days EXAM: CHEST  2 VIEW COMPARISON:  09/09/2014 FINDINGS: Scattered linear scarring, unchanged. No airspace consolidation. No effusions. Normal pulmonary vasculature. Normal hilar, mediastinal and cardiac contours. IMPRESSION: Chronic stable linear scarring.  No consolidation or effusion. Electronically Signed   By:  Andreas Newport M.D.   On: 06/28/2016 05:51    Procedures Procedures (including critical care time)  Medications Ordered in ED Medications  albuterol (PROVENTIL HFA;VENTOLIN HFA) 108 (90 Base) MCG/ACT inhaler 2 puff (2 puffs Inhalation Given 06/28/16 0817)  aerochamber plus with mask device 1 each (not administered)  albuterol (PROVENTIL) (2.5 MG/3ML) 0.083% nebulizer solution 5 mg (5 mg Nebulization Given 06/28/16 0643)  ipratropium (ATROVENT) nebulizer solution 0.5 mg (0.5 mg Nebulization Given 06/28/16 0643)  predniSONE (DELTASONE) tablet 60 mg (60 mg Oral Given 06/28/16 0643)  HYDROcodone-acetaminophen (NORCO/VICODIN) 5-325 MG per tablet 1 tablet (1 tablet Oral Given 06/28/16 9892)     Initial Impression / Assessment and Plan / ED Course  I have reviewed the triage vital signs and the nursing notes.  Pertinent labs & imaging results that were available during my care of the patient were reviewed by me and considered in my medical decision making (see chart for details).  Clinical Course as of Jun 29 823  Sun Jun 28, 2016  0821 Patient with clear and equal breath  sounds after albuterol administration.  [HM]  [id number] Vital signs have remained within normal limits throughout time in the emergency department. No tachycardia.  Patient is afebrile.  [HM]    Clinical Course User Index [HM] Abigail Butts, PA-C    Patient is to be discharged with recommendation toURI  follow up with PCP in regards to today's hospital visit. Chest pain is not likely of cardiac etiology d/t presentation, PERC negative, VSS, no tracheal deviation, no JVD or new murmur, RRR, breath sounds equal bilaterally, EKG without acute abnormalities, negative troponin, and negative CXR. Patient's chest pain is likely secondary to her Pt has been advised to return to the ED if CP becomes exertional, associated with diaphoresis or nausea, radiates to left jaw/arm, worsens or becomes concerning in any way. Pt appears reliable for follow up and is agreeable to discharge.   HEART score 2.    Final Clinical Impressions(s) / ED Diagnoses   Final diagnoses:  Precordial chest pain  Viral upper respiratory tract infection  Cough  Wheezing    New Prescriptions New Prescriptions   BENZONATATE (TESSALON) 100 MG CAPSULE    Take 1 capsule (100 mg total) by mouth every 8 (eight) hours.   DICLOFENAC (VOLTAREN) 75 MG EC TABLET    Take 1 tablet (75 mg total) by mouth 2 (two) times daily.   FLUTICASONE (FLONASE) 50 MCG/ACT NASAL SPRAY    Place 2 sprays into both nostrils daily.   PREDNISONE (DELTASONE) 20 MG TABLET    Take 2 tablets (40 mg total) by mouth daily.     Jarrett Soho Ashlynne Shetterly, PA-C 06/28/16 0825    Merryl Hacker, MD 06/29/16 0630

## 2016-06-28 NOTE — ED Triage Notes (Signed)
Patient with rib pain and chest pain.  She states that she has had a recent URI, has been coughing. She does not have any shortness of breath at this time, no nausea or vomiting.  Patient states she does have some anxiety but the pain continued so she decided to come in.

## 2016-07-20 ENCOUNTER — Ambulatory Visit (INDEPENDENT_AMBULATORY_CARE_PROVIDER_SITE_OTHER): Payer: BLUE CROSS/BLUE SHIELD | Admitting: Physician Assistant

## 2016-07-21 MED FILL — SPIRONOLACTONE 25 MG TABLET: 25 | 30 days supply | Qty: 30 | Fill #1

## 2016-07-29 ENCOUNTER — Ambulatory Visit (INDEPENDENT_AMBULATORY_CARE_PROVIDER_SITE_OTHER): Payer: Self-pay | Admitting: Physician Assistant

## 2016-07-29 ENCOUNTER — Encounter (INDEPENDENT_AMBULATORY_CARE_PROVIDER_SITE_OTHER): Payer: Self-pay | Admitting: Physician Assistant

## 2016-07-29 VITALS — BP 128/89 | HR 80 | Temp 98.2°F | Ht 66.0 in | Wt 290.6 lb

## 2016-07-29 DIAGNOSIS — I10 Essential (primary) hypertension: Secondary | ICD-10-CM

## 2016-07-29 DIAGNOSIS — E669 Obesity, unspecified: Secondary | ICD-10-CM

## 2016-07-29 MED ORDER — PHENTERMINE HCL 15 MG PO CAPS: 15.0000 mg | ORAL_CAPSULE | ORAL | 0 refills | Status: DC

## 2016-07-29 MED ORDER — LOSARTAN POTASSIUM 50 MG PO TABS: 100.0000 mg | ORAL_TABLET | Freq: Every day | ORAL | 1 refills | Status: DC

## 2016-07-29 MED ORDER — HYDROCHLOROTHIAZIDE 25 MG PO TABS: 25.0000 mg | ORAL_TABLET | Freq: Every day | ORAL | 1 refills | Status: DC

## 2016-07-29 NOTE — Progress Notes (Signed)
Pt presents for a follow up of Hypertension  Pt denies pain  Pt would like something for weight loss, is there something else besides phentermine?

## 2016-07-29 NOTE — Patient Instructions (Signed)
Phentermine tablets or capsules What is this medicine? PHENTERMINE (FEN ter meen) decreases your appetite. It is used with a reduced calorie diet and exercise to help you lose weight. This medicine may be used for other purposes; ask your health care provider or pharmacist if you have questions. COMMON BRAND NAME(S): Adipex-P, Atti-Plex P, Atti-Plex P Spansule, Fastin, Lomaira, Pro-Fast, Brailyn-8 What should I tell my health care provider before I take this medicine? They need to know if you have any of these conditions: -agitation -glaucoma -heart disease -high blood pressure -history of substance abuse -lung disease called Primary Pulmonary Hypertension (PPH) -taken an MAOI like Carbex, Eldepryl, Marplan, Nardil, or Parnate in last 14 days -thyroid disease -an unusual or allergic reaction to phentermine, other medicines, foods, dyes, or preservatives -pregnant or trying to get pregnant -breast-feeding How should I use this medicine? Take this medicine by mouth with a glass of water. Follow the directions on the prescription label. The instructions for use may differ based on the product and dose you are taking. Avoid taking this medicine in the evening. It may interfere with sleep. Take your doses at regular intervals. Do not take your medicine more often than directed. Talk to your pediatrician regarding the use of this medicine in children. While this drug may be prescribed for children 17 years or older for selected conditions, precautions do apply. Overdosage: If you think you have taken too much of this medicine contact a poison control center or emergency room at once. NOTE: This medicine is only for you. Do not share this medicine with others. What if I miss a dose? If you miss a dose, take it as soon as you can. If it is almost time for your next dose, take only that dose. Do not take double or extra doses. What may interact with this medicine? Do not take this medicine with any of  the following medications: -duloxetine -MAOIs like Carbex, Eldepryl, Marplan, Nardil, and Parnate -medicines for colds or breathing difficulties like pseudoephedrine or phenylephrine -procarbazine -sibutramine -SSRIs like citalopram, escitalopram, fluoxetine, fluvoxamine, paroxetine, and sertraline -stimulants like dexmethylphenidate, methylphenidate or modafinil -venlafaxine This medicine may also interact with the following medications: -medicines for diabetes This list may not describe all possible interactions. Give your health care provider a list of all the medicines, herbs, non-prescription drugs, or dietary supplements you use. Also tell them if you smoke, drink alcohol, or use illegal drugs. Some items may interact with your medicine. What should I watch for while using this medicine? Notify your physician immediately if you become short of breath while doing your normal activities. Do not take this medicine within 6 hours of bedtime. It can keep you from getting to sleep. Avoid drinks that contain caffeine and try to stick to a regular bedtime every night. This medicine was intended to be used in addition to a healthy diet and exercise. The best results are achieved this way. This medicine is only indicated for short-term use. Eventually your weight loss may level out. At that point, the drug will only help you maintain your new weight. Do not increase or in any way change your dose without consulting your doctor. You may get drowsy or dizzy. Do not drive, use machinery, or do anything that needs mental alertness until you know how this medicine affects you. Do not stand or sit up quickly, especially if you are an older patient. This reduces the risk of dizzy or fainting spells. Alcohol may increase dizziness and drowsiness. Avoid alcoholic   drinks. What side effects may I notice from receiving this medicine? Side effects that you should report to your doctor or health care professional as  soon as possible: -chest pain, palpitations -depression or severe changes in mood -increased blood pressure -irritability -nervousness or restlessness -severe dizziness -shortness of breath -problems urinating -unusual swelling of the legs -vomiting Side effects that usually do not require medical attention (report to your doctor or health care professional if they continue or are bothersome): -blurred vision or other eye problems -changes in sexual ability or desire -constipation or diarrhea -difficulty sleeping -dry mouth or unpleasant taste -headache -nausea This list may not describe all possible side effects. Call your doctor for medical advice about side effects. You may report side effects to FDA at 1-800-FDA-1088. Where should I keep my medicine? Keep out of the reach of children. This medicine can be abused. Keep your medicine in a safe place to protect it from theft. Do not share this medicine with anyone. Selling or giving away this medicine is dangerous and against the law. This medicine may cause accidental overdose and death if taken by other adults, children, or pets. Mix any unused medicine with a substance like cat litter or coffee grounds. Then throw the medicine away in a sealed container like a sealed bag or a coffee can with a lid. Do not use the medicine after the expiration date. Store at room temperature between 20 and 25 degrees C (68 and 77 degrees F). Keep container tightly closed. NOTE: This sheet is a summary. It may not cover all possible information. If you have questions about this medicine, talk to your doctor, pharmacist, or health care provider.  2018 Elsevier/Gold Standard (2015-01-11 12:53:15) Obesity, Adult Obesity is the condition of having too much total body fat. Being overweight or obese means that your weight is greater than what is considered healthy for your body size. Obesity is determined by a measurement called BMI. BMI is an estimate of  body fat and is calculated from height and weight. For adults, a BMI of 30 or higher is considered obese. Obesity can eventually lead to other health concerns and major illnesses, including:  Stroke.  Coronary artery disease (CAD).  Type 2 diabetes.  Some types of cancer, including cancers of the colon, breast, uterus, and gallbladder.  Osteoarthritis.  High blood pressure (hypertension).  High cholesterol.  Sleep apnea.  Gallbladder stones.  Infertility problems. What are the causes? The main cause of obesity is taking in (consuming) more calories than your body uses for energy. Other factors that contribute to this condition may include:  Being born with genes that make you more likely to become obese.  Having a medical condition that causes obesity. These conditions include:  Hypothyroidism.  Polycystic ovarian syndrome (PCOS).  Binge-eating disorder.  Cushing syndrome.  Taking certain medicines, such as steroids, antidepressants, and seizure medicines.  Not being physically active (sedentary lifestyle).  Living where there are limited places to exercise safely or buy healthy foods.  Not getting enough sleep. What increases the risk? The following factors may increase your risk of this condition:  Having a family history of obesity.  Being a woman of African-American descent.  Being a man of Hispanic descent. What are the signs or symptoms? Having excessive body fat is the main symptom of this condition. How is this diagnosed? This condition may be diagnosed based on:  Your symptoms.  Your medical history.  A physical exam. Your health care provider may measure:  Your BMI. If you are an adult with a BMI between 25 and less than 30, you are considered overweight. If you are an adult with a BMI of 30 or higher, you are considered obese.  The distances around your hips and your waist (circumferences). These may be compared to each other to help  diagnose your condition.  Your skinfold thickness. Your health care provider may gently pinch a fold of your skin and measure it. How is this treated? Treatment for this condition often includes changing your lifestyle. Treatment may include some or all of the following:  Dietary changes. Work with your health care provider and a dietitian to set a weight-loss goal that is healthy and reasonable for you. Dietary changes may include eating:  Smaller portions. A portion size is the amount of a particular food that is healthy for you to eat at one time. This varies from person to person.  Low-calorie or low-fat options.  More whole grains, fruits, and vegetables.  Regular physical activity. This may include aerobic activity (cardio) and strength training.  Medicine to help you lose weight. Your health care provider may prescribe medicine if you are unable to lose 1 pound a week after 6 weeks of eating more healthily and doing more physical activity.  Surgery. Surgical options may include gastric banding and gastric bypass. Surgery may be done if:  Other treatments have not helped to improve your condition.  You have a BMI of 40 or higher.  You have life-threatening health problems related to obesity. Follow these instructions at home:   Eating and drinking    Follow recommendations from your health care provider about what you eat and drink. Your health care provider may advise you to:  Limit fast foods, sweets, and processed snack foods.  Choose low-fat options, such as low-fat milk instead of whole milk.  Eat 5 or more servings of fruits or vegetables every day.  Eat at home more often. This gives you more control over what you eat.  Choose healthy foods when you eat out.  Learn what a healthy portion size is.  Keep low-fat snacks on hand.  Avoid sugary drinks, such as soda, fruit juice, iced tea sweetened with sugar, and flavored milk.  Eat a healthy  breakfast.  Drink enough water to keep your urine clear or pale yellow.  Do not go without eating for long periods of time (do not fast) or follow a fad diet. Fasting and fad diets can be unhealthy and even dangerous. Physical Activity   Exercise regularly, as told by your health care provider. Ask your health care provider what types of exercise are safe for you and how often you should exercise.  Warm up and stretch before being active.  Cool down and stretch after being active.  Rest between periods of activity. Lifestyle   Limit the time that you spend in front of your TV, computer, or video game system.  Find ways to reward yourself that do not involve food.  Limit alcohol intake to no more than 1 drink a day for nonpregnant women and 2 drinks a day for men. One drink equals 12 oz of beer, 5 oz of wine, or 1 oz of hard liquor. General instructions   Keep a weight loss journal to keep track of the food you eat and how much you exercise you get.  Take over-the-counter and prescription medicines only as told by your health care provider.  Take vitamins and supplements only as told  by your health care provider.  Consider joining a support group. Your health care provider may be able to recommend a support group.  Keep all follow-up visits as told by your health care provider. This is important. Contact a health care provider if:  You are unable to meet your weight loss goal after 6 weeks of dietary and lifestyle changes. This information is not intended to replace advice given to you by your health care provider. Make sure you discuss any questions you have with your health care provider. Document Released: 05/14/2004 Document Revised: 09/09/2015 Document Reviewed: 01/23/2015 Elsevier Interactive Patient Education  2017 [employer].

## 2016-07-29 NOTE — Progress Notes (Signed)
Subjective:  Patient ID: [name], female    DOB: February 02, 1971  Age: 46 y.o. MRN: [id number]  CC: f/u HTN  HPI VENA BASSINGER is a 46 y.o. female with a PMH of Anxiety, depression, and HTN presents for f/u HTN and to address obesity. Takes anti-hypertensives as directed and feels her BP is better controlled. No side effects reported. No cardiovascular symptoms reported.     Would like to address obesity. She is requesting medication to help with her weight. She has begun to change her diet but has not committed to an exercise regimen yet. She is tired of her weight interfering with her energy levels, physical ability, and predisposing her to diabetes. She is prediabetic now and wants to avoid becoming diabetic.    Review of Systems  Constitutional: Negative for chills, fever and malaise/fatigue.  Eyes: Negative for blurred vision.  Respiratory: Negative for shortness of breath.   Cardiovascular: Negative for chest pain and palpitations.  Gastrointestinal: Negative for abdominal pain and nausea.  Genitourinary: Negative for dysuria and hematuria.  Musculoskeletal: Negative for joint pain and myalgias.  Skin: Negative for rash.  Neurological: Negative for tingling and headaches.  Psychiatric/Behavioral: Negative for depression. The patient is not nervous/anxious.     Objective:  BP 128/89 (BP Location: Right Arm, Patient Position: Sitting, Cuff Size: Large)   Pulse 80   Temp 98.2 F (36.8 C) (Oral)   Ht 5\' 6"  (1.676 m)   Wt 290 lb 9.6 oz (131.8 kg)   SpO2 94%   BMI 46.90 kg/m   BP/Weight 07/29/2016 09/04/15 07/28/4494  Systolic BP 759 163 846  Diastolic BP 89 69 90  Wt. (Lbs) 290.6 - 293.2  BMI 46.9 - 47.32      Physical Exam  Constitutional: She is oriented to person, place, and time.  Well developed, obese, NAD, polite  HENT:  Head: Normocephalic and atraumatic.  Eyes: No scleral icterus.  Neck: Normal range of motion. Neck supple. No thyromegaly present.   Cardiovascular: Normal rate, regular rhythm and normal heart sounds.   Pulmonary/Chest: Effort normal and breath sounds normal.  Musculoskeletal: She exhibits no edema.  Neurological: She is alert and oriented to person, place, and time.  Skin: Skin is warm and dry. No rash noted. No erythema. No pallor.  Psychiatric: She has a normal mood and affect. Her behavior is normal. Thought content normal.  Vitals reviewed.    Assessment & Plan:   1. Hypertension, unspecified type - losartan (COZAAR) 50 MG tablet; Take 2 tablets (100 mg total) by mouth daily.  Dispense: 180 tablet; Refill: 1 - hydrochlorothiazide (HYDRODIURIL) 25 MG tablet; Take 1 tablet (25 mg total) by mouth daily. Take on tablet in the morning.  Dispense: 90 tablet; Refill: 1 - Stop Spironolactone  - Counseled on diet and exercise - Will consider nutrition referral at next visit  2. Obesity with serious comorbidity, unspecified classification, unspecified obesity type - phentermine 15 MG capsule; Take 1 capsule (15 mg total) by mouth every morning.  Dispense: 30 capsule; Refill: 0   Meds ordered this encounter  Medications  . losartan (COZAAR) 50 MG tablet    Sig: Take 2 tablets (100 mg total) by mouth daily.    Dispense:  180 tablet    Refill:  1    Order Specific Question:   Supervising Provider    Answer:   Tresa Garter [id number]  . hydrochlorothiazide (HYDRODIURIL) 25 MG tablet    Sig: Take 1 tablet (25 mg total)  by mouth daily. Take on tablet in the morning.    Dispense:  90 tablet    Refill:  1    Order Specific Question:   Supervising Provider    Answer:   Tresa Garter [id number]  . phentermine 15 MG capsule    Sig: Take 1 capsule (15 mg total) by mouth every morning.    Dispense:  30 capsule    Refill:  0    Order Specific Question:   Supervising Provider    Answer:   Tresa Garter [[id number]]    Follow-up: Return in about 4 weeks (around 08/26/2016) for obesity.   Clent Demark PA

## 2016-08-17 MED FILL — LOSARTAN POTASSIUM 50 MG TA: 50 | 30 days supply | Qty: 60 | Fill #0

## 2016-08-17 MED FILL — AMLODIPINE BESYLATE 10 MG T: 10 | 30 days supply | Qty: 30 | Fill #0

## 2016-08-17 MED FILL — HYDROCHLOROTHIAZIDE 25 MG T: 25 | 30 days supply | Qty: 30 | Fill #0

## 2016-08-31 ENCOUNTER — Ambulatory Visit (INDEPENDENT_AMBULATORY_CARE_PROVIDER_SITE_OTHER): Payer: BLUE CROSS/BLUE SHIELD | Admitting: Physician Assistant

## 2016-09-16 MED FILL — HYDROCHLOROTHIAZIDE 25 MG T: 25 | 30 days supply | Qty: 30 | Fill #1

## 2016-09-16 MED FILL — LOSARTAN POTASSIUM 50 MG TA: 50 | 30 days supply | Qty: 60 | Fill #1

## 2016-09-18 ENCOUNTER — Encounter (INDEPENDENT_AMBULATORY_CARE_PROVIDER_SITE_OTHER): Payer: Self-pay | Admitting: Physician Assistant

## 2016-09-18 ENCOUNTER — Ambulatory Visit (INDEPENDENT_AMBULATORY_CARE_PROVIDER_SITE_OTHER): Payer: Self-pay | Admitting: Physician Assistant

## 2016-09-18 VITALS — BP 124/87 | HR 78 | Temp 98.2°F | Wt 292.2 lb

## 2016-09-18 DIAGNOSIS — F411 Generalized anxiety disorder: Secondary | ICD-10-CM

## 2016-09-18 DIAGNOSIS — E669 Obesity, unspecified: Secondary | ICD-10-CM

## 2016-09-18 DIAGNOSIS — I10 Essential (primary) hypertension: Secondary | ICD-10-CM

## 2016-09-18 MED FILL — SERTRALINE HCL 100 MG TAB: 100 | 30 days supply | Qty: 30 | Fill #0

## 2016-09-18 NOTE — Patient Instructions (Signed)

## 2016-09-18 NOTE — Progress Notes (Signed)
  Subjective:  Patient ID: Catherine [name], female    DOB: 07-04-1970  Age: 46 y.o. MRN: [id number]  CC: f/u HTN   HPI Catherine [name] is a 46 y.o. female with a PMH of anxiety, depression, bronchitis, and HTN presents to f/u on HTN. BP has been well controlled on Losartan 50mg  and HCTZ 25 mg. Has not taken Amlodipine 5mg  for several weeks now. Feels well and denies any cardiovascular complaints.     Takes Sertraline as directed. Feels that Sertraline has controlled her anxiety attacks well. She is more resistant to stressors.       ROS Review of Systems  Constitutional: Negative for chills, fever and malaise/fatigue.  Eyes: Negative for blurred vision.  Respiratory: Negative for shortness of breath.   Cardiovascular: Negative for chest pain and palpitations.  Gastrointestinal: Negative for abdominal pain and nausea.  Genitourinary: Negative for dysuria and hematuria.  Musculoskeletal: Negative for joint pain and myalgias.  Skin: Negative for rash.  Neurological: Negative for tingling and headaches.  Psychiatric/Behavioral: Negative for depression. The patient is not nervous/anxious.     Objective:  BP 124/87 (BP Location: Right Arm, Patient Position: Sitting, Cuff Size: Large)   Pulse 78   Temp 98.2 F (36.8 C) (Oral)   Wt 292 lb 3.2 oz (132.5 kg)   SpO2 94%   BMI 47.16 kg/m   BP/Weight 09/18/2016 07/29/2016 08/06/6220  Systolic BP 979 892 119  Diastolic BP 87 89 69  Wt. (Lbs) 292.2 290.6 -  BMI 47.16 46.9 -      Physical Exam  Constitutional: She is oriented to person, place, and time.  Well developed, obese, NAD, polite  HENT:  Head: Normocephalic and atraumatic.  Eyes: No scleral icterus.  Cardiovascular: Normal rate, regular rhythm and normal heart sounds.   Pulmonary/Chest: Effort normal and breath sounds normal.  Musculoskeletal: She exhibits no edema.  Neurological: She is alert and oriented to person, place, and time. No cranial nerve deficit. Coordination  normal.  Skin: Skin is warm and dry. No rash noted. No erythema. No pallor.  Psychiatric: She has a normal mood and affect. Her behavior is normal. Thought content normal.  Vitals reviewed.    Assessment & Plan:   1. Hypertension, unspecified type - Continue on Losartan 50mg  and HCTZ 25 mg. - Stop amlodipine 5mg   2. Obesity with serious comorbidity, unspecified classification, unspecified obesity type - Pt has not filled rx for phentermine due to financial constraints. Will consider at another time.  3. Generalized anxiety disorder - Well controlled on Sertraline 100mg . Continue Sertraline.    Follow-up: Return in about 3 months (around 12/19/2016) for anxiety.   Clent Demark PA

## 2016-10-06 ENCOUNTER — Telehealth (INDEPENDENT_AMBULATORY_CARE_PROVIDER_SITE_OTHER): Payer: Self-pay | Admitting: Physician Assistant

## 2016-10-06 NOTE — Telephone Encounter (Signed)
FWD to PCP. Sofie Schendel S Cabell Lazenby, CMA  

## 2016-10-06 NOTE — Telephone Encounter (Signed)
Patient called stated has concerns with blood pressure, thinks she is retaining fluid in her feet. Wants to know if Domenica Fail can add another BP medication.   Gave patient appt for 10-07-2016

## 2016-10-06 NOTE — Telephone Encounter (Signed)
Pt should be seen and BP taken. She is supposed to be on Losartan and HCTZ. She was supposed to have stopped Amlodipine.

## 2016-10-07 ENCOUNTER — Encounter (INDEPENDENT_AMBULATORY_CARE_PROVIDER_SITE_OTHER): Payer: Self-pay | Admitting: Physician Assistant

## 2016-10-07 ENCOUNTER — Ambulatory Visit (INDEPENDENT_AMBULATORY_CARE_PROVIDER_SITE_OTHER): Payer: Self-pay | Admitting: Physician Assistant

## 2016-10-07 VITALS — BP 120/83 | HR 71 | Temp 98.0°F | Wt 293.4 lb

## 2016-10-07 DIAGNOSIS — R300 Dysuria: Secondary | ICD-10-CM

## 2016-10-07 DIAGNOSIS — I10 Essential (primary) hypertension: Secondary | ICD-10-CM

## 2016-10-07 LAB — POCT URINALYSIS DIPSTICK
Blood, UA: NEGATIVE
Glucose, UA: NEGATIVE
KETONES UA: NEGATIVE
Nitrite, UA: NEGATIVE
PROTEIN UA: 30
Spec Grav, UA: 1.025 (ref 1.010–1.025)
Urobilinogen, UA: 0.2 E.U./dL
pH, UA: 5.5 (ref 5.0–8.0)

## 2016-10-07 MED ORDER — CIPROFLOXACIN HCL 500 MG PO TABS: 500.0000 mg | ORAL_TABLET | Freq: Two times a day (BID) | ORAL | 0 refills | Status: DC

## 2016-10-07 MED ORDER — CIPROFLOXACIN HCL 500 MG PO TABS: 500.0000 mg | ORAL_TABLET | Freq: Two times a day (BID) | ORAL | 0 refills | Status: AC

## 2016-10-07 NOTE — Patient Instructions (Signed)
DASH Eating Plan DASH stands for "Dietary Approaches to Stop Hypertension." The DASH eating plan is a healthy eating plan that has been shown to reduce high blood pressure (hypertension). It may also reduce your risk for type 2 diabetes, heart disease, and stroke. The DASH eating plan may also help with weight loss. What are tips for following this plan? General guidelines  Avoid eating more than 2,300 mg (milligrams) of salt (sodium) a day. If you have hypertension, you may need to reduce your sodium intake to 1,500 mg a day.  Limit alcohol intake to no more than 1 drink a day for nonpregnant women and 2 drinks a day for men. One drink equals 12 oz of beer, 5 oz of wine, or 1 oz of hard liquor.  Work with your health care provider to maintain a healthy body weight or to lose weight. Ask what an ideal weight is for you.  Get at least 30 minutes of exercise that causes your heart to beat faster (aerobic exercise) most days of the week. Activities may include walking, swimming, or biking.  Work with your health care provider or diet and nutrition specialist (dietitian) to adjust your eating plan to your individual calorie needs. Reading food labels  Check food labels for the amount of sodium per serving. Choose foods with less than 5 percent of the Daily Value of sodium. Generally, foods with less than 300 mg of sodium per serving fit into this eating plan.  To find whole grains, look for the word "whole" as the first word in the ingredient list. Shopping  Buy products labeled as "low-sodium" or "no salt added."  Buy fresh foods. Avoid canned foods and premade or frozen meals. Cooking  Avoid adding salt when cooking. Use salt-free seasonings or herbs instead of table salt or sea salt. Check with your health care provider or pharmacist before using salt substitutes.  Do not fry foods. Cook foods using healthy methods such as baking, boiling, grilling, and broiling instead.  Cook with  heart-healthy oils, such as olive, canola, soybean, or sunflower oil. Meal planning   Eat a balanced diet that includes: ? 5 or more servings of fruits and vegetables each day. At each meal, try to fill half of your plate with fruits and vegetables. ? Up to 6-8 servings of whole grains each day. ? Less than 6 oz of lean meat, poultry, or fish each day. A 3-oz serving of meat is about the same size as a deck of cards. One egg equals 1 oz. ? 2 servings of low-fat dairy each day. ? A serving of nuts, seeds, or beans 5 times each week. ? Heart-healthy fats. Healthy fats called Omega-3 fatty acids are found in foods such as flaxseeds and coldwater fish, like sardines, salmon, and mackerel.  Limit how much you eat of the following: ? Canned or prepackaged foods. ? Food that is high in trans fat, such as fried foods. ? Food that is high in saturated fat, such as fatty meat. ? Sweets, desserts, sugary drinks, and other foods with added sugar. ? Full-fat dairy products.  Do not salt foods before eating.  Try to eat at least 2 vegetarian meals each week.  Eat more home-cooked food and less restaurant, buffet, and fast food.  When eating at a restaurant, ask that your food be prepared with less salt or no salt, if possible. What foods are recommended? The items listed may not be a complete list. Talk with your dietitian about what   dietary choices are best for you. Grains Whole-grain or whole-wheat bread. Whole-grain or whole-wheat pasta. Brown rice. Oatmeal. Quinoa. Bulgur. Whole-grain and low-sodium cereals. Pita bread. Low-fat, low-sodium crackers. Whole-wheat flour tortillas. Vegetables Fresh or frozen vegetables (raw, steamed, roasted, or grilled). Low-sodium or reduced-sodium tomato and vegetable juice. Low-sodium or reduced-sodium tomato sauce and tomato paste. Low-sodium or reduced-sodium canned vegetables. Fruits All fresh, dried, or frozen fruit. Canned fruit in natural juice (without  added sugar). Meat and other protein foods Skinless chicken or turkey. Ground chicken or turkey. Pork with fat trimmed off. Fish and seafood. Egg whites. Dried beans, peas, or lentils. Unsalted nuts, nut butters, and seeds. Unsalted canned beans. Lean cuts of beef with fat trimmed off. Low-sodium, lean deli meat. Dairy Low-fat (1%) or fat-free (skim) milk. Fat-free, low-fat, or reduced-fat cheeses. Nonfat, low-sodium ricotta or cottage cheese. Low-fat or nonfat yogurt. Low-fat, low-sodium cheese. Fats and oils Soft margarine without trans fats. Vegetable oil. Low-fat, reduced-fat, or light mayonnaise and salad dressings (reduced-sodium). Canola, safflower, olive, soybean, and sunflower oils. Avocado. Seasoning and other foods Herbs. Spices. Seasoning mixes without salt. Unsalted popcorn and pretzels. Fat-free sweets. What foods are not recommended? The items listed may not be a complete list. Talk with your dietitian about what dietary choices are best for you. Grains Baked goods made with fat, such as croissants, muffins, or some breads. Dry pasta or rice meal packs. Vegetables Creamed or fried vegetables. Vegetables in a cheese sauce. Regular canned vegetables (not low-sodium or reduced-sodium). Regular canned tomato sauce and paste (not low-sodium or reduced-sodium). Regular tomato and vegetable juice (not low-sodium or reduced-sodium). Pickles. Olives. Fruits Canned fruit in a light or heavy syrup. Fried fruit. Fruit in cream or butter sauce. Meat and other protein foods Fatty cuts of meat. Ribs. Fried meat. Bacon. Sausage. Bologna and other processed lunch meats. Salami. Fatback. Hotdogs. Bratwurst. Salted nuts and seeds. Canned beans with added salt. Canned or smoked fish. Whole eggs or egg yolks. Chicken or turkey with skin. Dairy Whole or 2% milk, cream, and half-and-half. Whole or full-fat cream cheese. Whole-fat or sweetened yogurt. Full-fat cheese. Nondairy creamers. Whipped toppings.  Processed cheese and cheese spreads. Fats and oils Butter. Stick margarine. Lard. Shortening. Ghee. Bacon fat. Tropical oils, such as coconut, palm kernel, or palm oil. Seasoning and other foods Salted popcorn and pretzels. Onion salt, garlic salt, seasoned salt, table salt, and sea salt. Worcestershire sauce. Tartar sauce. Barbecue sauce. Teriyaki sauce. Soy sauce, including reduced-sodium. Steak sauce. Canned and packaged gravies. Fish sauce. Oyster sauce. Cocktail sauce. Horseradish that you find on the shelf. Ketchup. Mustard. Meat flavorings and tenderizers. Bouillon cubes. Hot sauce and Tabasco sauce. Premade or packaged marinades. Premade or packaged taco seasonings. Relishes. Regular salad dressings. Where to find more information:  National Heart, Lung, and Blood Institute: www.nhlbi.nih.gov  American Heart Association: www.heart.org Summary  The DASH eating plan is a healthy eating plan that has been shown to reduce high blood pressure (hypertension). It may also reduce your risk for type 2 diabetes, heart disease, and stroke.  With the DASH eating plan, you should limit salt (sodium) intake to 2,300 mg a day. If you have hypertension, you may need to reduce your sodium intake to 1,500 mg a day.  When on the DASH eating plan, aim to eat more fresh fruits and vegetables, whole grains, lean proteins, low-fat dairy, and heart-healthy fats.  Work with your health care provider or diet and nutrition specialist (dietitian) to adjust your eating plan to your individual   calorie needs. This information is not intended to replace advice given to you by your health care provider. Make sure you discuss any questions you have with your health care provider. Document Released: 03/26/2011 Document Revised: 03/30/2016 Document Reviewed: 03/30/2016 Elsevier Interactive Patient Education  2017 Elsevier Inc.  

## 2016-10-07 NOTE — Progress Notes (Signed)
  Subjective:  Patient ID: Catherine [name], female    DOB: 06-21-1970  Age: 46 y.o. MRN: [id number]  CC: HTN  HPI Catherine [name] is a 46 y.o. female with a PMH of anxiety, depression, and hypertension presents with concern for intermittent LE edema. No current edema but states that she has "cankles" at times. Concerned because her father died from "fluid around the heart". Is taking Losartan and HCTZ as directed. Feels stressed since having to stay at her sister's house. Eating prepared foods such as TV dinners with high sodium content. Has not been exercising and is not sleeping well at her sister's house. Does not endorse any other symptoms.     Outpatient Medications Prior to Visit  Medication Sig Dispense Refill  . hydrochlorothiazide (HYDRODIURIL) 25 MG tablet Take 1 tablet (25 mg total) by mouth daily. Take on tablet in the morning. 90 tablet 1  . losartan (COZAAR) 50 MG tablet Take 2 tablets (100 mg total) by mouth daily. 180 tablet 1  . sertraline (ZOLOFT) 100 MG tablet Take 1 tablet (100 mg total) by mouth daily. 90 tablet 1   No facility-administered medications prior to visit.      ROS Review of Systems  Constitutional: Negative for chills, fever and malaise/fatigue.  Eyes: Negative for blurred vision.  Respiratory: Negative for shortness of breath.   Cardiovascular: Positive for leg swelling. Negative for chest pain and palpitations.  Gastrointestinal: Negative for abdominal pain and nausea.  Genitourinary: Negative for dysuria and hematuria.  Musculoskeletal: Negative for joint pain and myalgias.  Skin: Negative for rash.  Neurological: Negative for tingling and headaches.  Psychiatric/Behavioral: Negative for depression. The patient is not nervous/anxious.     Objective:  BP 120/83 (BP Location: Left Arm, Patient Position: Sitting, Cuff Size: Large)   Pulse 71   Temp 98 F (36.7 C) (Oral)   Wt 293 lb 6.4 oz (133.1 kg)   SpO2 94%   BMI 47.36 kg/m   BP/Weight  10/07/2016 09/18/2016 0/73/7106  Systolic BP 269 485 462  Diastolic BP 83 87 89  Wt. (Lbs) 293.4 292.2 290.6  BMI 47.36 47.16 46.9      Physical Exam  Constitutional: She is oriented to person, place, and time.  Well developed, obese NAD, polite  HENT:  Head: Normocephalic and atraumatic.  Eyes: No scleral icterus.  Neck: Normal range of motion. Neck supple.  Cardiovascular: Normal rate, regular rhythm and normal heart sounds.   Pulmonary/Chest: Effort normal and breath sounds normal.  Musculoskeletal: She exhibits no edema.  Neurological: She is alert and oriented to person, place, and time. No cranial nerve deficit. Coordination normal.  Skin: Skin is warm and dry. No rash noted. No erythema. No pallor.  Psychiatric: She has a normal mood and affect. Her behavior is normal. Thought content normal.  Vitals reviewed.    Assessment & Plan:   1. Hypertension, unspecified type - Continue on Losartan and HCTZ.   2. Dysuria - Urinalysis dipstick with small leukocytes. - Begin Cipro 500mg  BID x3 days - Urine cx.    Follow-up: Return in about 4 weeks (around 11/04/2016) for full physical.   Clent Demark PA

## 2016-10-07 NOTE — Telephone Encounter (Signed)
Patient was seen in office today and discussed with PCP. Nat Christen, CMA

## 2016-10-09 LAB — URINE CULTURE: Organism ID, Bacteria: NO GROWTH

## 2016-10-19 MED FILL — LOSARTAN POTASSIUM 50 MG TA: 50 | 30 days supply | Qty: 60 | Fill #2

## 2016-10-19 MED FILL — HYDROCHLOROTHIAZIDE 25 MG T: 25 | 30 days supply | Qty: 30 | Fill #2

## 2016-10-28 ENCOUNTER — Institutional Professional Consult (permissible substitution): Payer: BLUE CROSS/BLUE SHIELD | Admitting: Neurology

## 2016-11-12 ENCOUNTER — Telehealth: Payer: Self-pay | Admitting: *Deleted

## 2016-11-12 NOTE — Telephone Encounter (Signed)
Medical Assistant left message on patient's home and cell voicemail. Voicemail states to give a call back to [place] with [facility] at [phone]. Patient is aware of no urine growth being noted.

## 2016-11-12 NOTE — Telephone Encounter (Signed)
-----   Message from Clent Demark, PA-C sent at 10/09/2016  8:35 AM EDT ----- No growth on urine culture.

## 2016-11-18 MED FILL — SERTRALINE HCL 100 MG TAB: 100 | 30 days supply | Qty: 30 | Fill #1

## 2016-11-18 MED FILL — HYDROCHLOROTHIAZIDE 25 MG T: 25 | 30 days supply | Qty: 30 | Fill #3

## 2016-11-18 MED FILL — LOSARTAN POTASSIUM 50 MG TA: 50 | 30 days supply | Qty: 60 | Fill #3

## 2016-11-26 ENCOUNTER — Encounter: Payer: Self-pay | Admitting: Neurology

## 2016-11-26 ENCOUNTER — Ambulatory Visit (INDEPENDENT_AMBULATORY_CARE_PROVIDER_SITE_OTHER): Payer: PRIVATE HEALTH INSURANCE | Admitting: Neurology

## 2016-11-26 VITALS — BP 123/80 | HR 92 | Ht 67.0 in | Wt 291.0 lb

## 2016-11-26 DIAGNOSIS — R609 Edema, unspecified: Secondary | ICD-10-CM | POA: Diagnosis not present

## 2016-11-26 DIAGNOSIS — R0683 Snoring: Secondary | ICD-10-CM

## 2016-11-26 DIAGNOSIS — Z6841 Body Mass Index (BMI) 40.0 and over, adult: Secondary | ICD-10-CM | POA: Diagnosis not present

## 2016-11-26 DIAGNOSIS — R0681 Apnea, not elsewhere classified: Secondary | ICD-10-CM

## 2016-11-26 DIAGNOSIS — R51 Headache: Secondary | ICD-10-CM

## 2016-11-26 DIAGNOSIS — R519 Headache, unspecified: Secondary | ICD-10-CM

## 2016-11-26 DIAGNOSIS — G4719 Other hypersomnia: Secondary | ICD-10-CM

## 2016-11-26 NOTE — Patient Instructions (Addendum)
Based on your symptoms and your exam I believe you are at risk for obstructive sleep apnea or OSA, and I think we should proceed with a sleep study to determine whether you do or do not have OSA and how severe it is. If you have more than mild OSA, I want you to consider treatment with CPAP. Please remember, the risks and ramifications of moderate to severe obstructive sleep apnea or OSA are: Cardiovascular disease, including congestive heart failure, stroke, difficult to control hypertension, arrhythmias, and even type 2 diabetes has been linked to untreated OSA. Sleep apnea causes disruption of sleep and sleep deprivation in most cases, which, in turn, can cause recurrent headaches, problems with memory, mood, concentration, focus, and vigilance. Most people with untreated sleep apnea report excessive daytime sleepiness, which can affect their ability to drive. Please do not drive if you feel sleepy.   I will likely see you back after your sleep study to go over the test results and where to go from there. We will call you after your sleep study to advise about the results (most likely, you will hear from Kristen, my nurse) and to set up an appointment at the time, as necessary.    Our sleep lab administrative assistant, Dawn will meet with you or call you to schedule your sleep study. If you don't hear back from her by 2 weeks please feel free to call her at [phone]. This is her direct line and please leave a message with your phone number to call back if you get the voicemail box. She will call back as soon as possible.   

## 2016-11-26 NOTE — Progress Notes (Signed)
Subjective:    Patient ID: Catherine [name] is a 46 y.o. female.  HPI     Star Age, MD, PhD [facility] [address], [address] [place], [place] [address]  Dear Francee Piccolo,   I saw your patient, Catherine [name], upon your kind request in my neurologic clinic today for initial consultation of her sleep disorder, in particular, concern for underlying obstructive sleep apnea. The patient is unaccompanied today. As you know, [name] is a 46 year old right-handed woman with an underlying medical history of hypertension, lower extremity edema, depression, anxiety and morbid obesity with a BMI of over 40, who reports snoring and excessive daytime somnolence. I reviewed your office note from 10/07/2016. Lives with 45 yo and 65 yo Daughter and 2 GCs. Works in a call center. Epworth sleepiness score is 9 out of 24, fatigue score is 33 out of 63. She is single. She does drink coffee in the morning and 1 soda per day at most. She does not have night to night nocturia but has woken up with a headache at times. She feels sleepy during the day. Her family has noticed pauses in her breathing while asleep and loud snoring. She denies any telltale restless leg symptoms. Snoring has become worse as she gained weight. She smokes about 1/3 or 1/2 ppd, she drinks alcohol very occasionally. She had a Dx of sarcoidosis in the past, years ago, treated with Prednisone, but no longer has treatment or FU for this.  In the past, she tried medication for weight loss but it increased her blood pressure. She was given a recent prescription for weight loss medicine but has not filled it yet for fear of side effects. Bedtime is currently somewhat variable. Wakeup time between 5:30 or 6:30 AM.  Her Past Medical History Is Significant For: Past Medical History:  Diagnosis Date  . Anxiety   . Bronchitis   . Depression   . Hypertension     Her Past Surgical History Is Significant For: Past  Surgical History:  Procedure Laterality Date  . BUNIONECTOMY  2012  . CESAREAN SECTION     X 2  . LAVH  2009   Leiomyoma and menorrhagia  . MYOMECTOMY  2005    Her Family History Is Significant For: Family History  Problem Relation Age of Onset  . Hypertension Mother   . Hypertension Father   . Hypertension Sister     Her Social History Is Significant For: Social History   Social History  . Marital status: Single    Spouse name: N/A  . Number of children: N/A  . Years of education: N/A   Social History Main Topics  . Smoking status: Current Every Day Smoker    Packs/day: 0.25    Types: Cigarettes  . Smokeless tobacco: Never Used  . Alcohol use 0.0 oz/week     Comment: socially  . Drug use: No  . Sexual activity: Yes    Birth control/ protection: Surgical     Comment: 1st intercourse 46 yo-More than 5 partners-HYST   Other Topics Concern  . None   Social History Narrative  . None    Her Allergies Are:  Allergies  Allergen Reactions  . Latex Rash  :   Her Current Medications Are:  Outpatient Encounter Prescriptions as of 11/26/2016  Medication Sig  . hydrochlorothiazide (HYDRODIURIL) 25 MG tablet Take 1 tablet (25 mg total) by mouth daily. Take on tablet in the morning.  Marland Kitchen losartan (COZAAR)  50 MG tablet Take 2 tablets (100 mg total) by mouth daily.  . sertraline (ZOLOFT) 100 MG tablet Take 1 tablet (100 mg total) by mouth daily.   No facility-administered encounter medications on file as of 11/26/2016.   :  Review of Systems:  Out of a complete 14 point review of systems, all are reviewed and negative with the exception of these symptoms as listed below: Review of Systems  Neurological:       Pt presents today to discuss her sleep. Pt has never had a sleep study but does endorse snoring.  Epworth Sleepiness Scale 0= would never doze 1= slight chance of dozing 2= moderate chance of dozing 3= high chance of dozing  Sitting and reading: 2 Watching  TV:2 Sitting inactive in a public place (ex. Theater or meeting): 1 As a passenger in a car for an hour without a break: 1 Lying down to rest in the afternoon: 2 Sitting and talking to someone: 0 Sitting quietly after lunch (no alcohol): 1 In a car, while stopped in traffic: 0 Total: 9    Objective:  Neurological Exam  Physical Exam Physical Examination:   Vitals:   11/26/16 1627  BP: 123/80  Pulse: 92    General Examination: The patient is a very pleasant 46 y.o. female in no acute distress. She appears well-developed and well-nourished and adequately groomed.   HEENT: Normocephalic, atraumatic, pupils are equal, round and reactive to light and accommodation. Extraocular tracking is good without limitation to gaze excursion or nystagmus noted. Normal smooth pursuit is noted. Hearing is grossly intact. Face is symmetric with normal facial animation and normal facial sensation. Speech is clear with no dysarthria noted. There is no hypophonia. There is no lip, neck/head, jaw or voice tremor. Neck is supple with full range of passive and active motion. There are no carotid bruits on auscultation. Oropharynx exam reveals: mild mouth dryness, adequate dental hygiene and moderate airway crowding, due to wider tongue base, larger uvula, tonsils in place, about 1+ bilaterally. Mallampati is class II. Neck circumference is 16-5/8 inches.  Chest: Clear to auscultation without wheezing, rhonchi or crackles noted.  Heart: S1+S2+0, regular and normal without murmurs, rubs or gallops noted.   Abdomen: Soft, non-tender and non-distended with normal bowel sounds appreciated on auscultation.  Extremities: There is trace pitting edema in the distal lower extremities bilaterally, around the ankles, right more noticeable than left.  Skin: Warm and dry without trophic changes noted.  Musculoskeletal: exam reveals no obvious joint deformities, tenderness or joint swelling or erythema.    Neurologically:  Mental status: The patient is awake, alert and oriented in all 4 spheres. Her immediate and remote memory, attention, language skills and fund of knowledge are appropriate. There is no evidence of aphasia, agnosia, apraxia or anomia. Speech is clear with normal prosody and enunciation. Thought process is linear. Mood is normal and affect is normal.  Cranial nerves II - XII are as described above under HEENT exam. In addition: shoulder shrug is normal with equal shoulder height noted. Motor exam: Normal bulk, strength and tone is noted. There is no drift, tremor or rebound. Romberg is negative. Reflexes are 2+ throughout. Fine motor skills and coordination: intact with normal finger taps, normal hand movements, normal rapid alternating patting, normal foot taps and normal foot agility.  Cerebellar testing: No dysmetria or intention tremor on finger to nose testing. Heel to shin is unremarkable bilaterally. There is no truncal or gait ataxia.  Sensory exam: intact to  light touch in the upper and lower extremities.  Gait, station and balance: She stands easily. No veering to one side is noted. No leaning to one side is noted. Posture is age-appropriate and stance is narrow based. Gait shows normal stride length and normal pace. No problems turning are noted. Tandem walk is challenging in the beginning.   Assessment and Plan:  In summary, LEGACI TARMAN is a very pleasant 46 y.o.-year old female with an underlying medical history of hypertension, lower extremity edema, depression, anxiety and morbid obesity with a BMI of over 40, whose history and physical exam are concerning for obstructive sleep apnea (OSA). I had a long chat with the patient about my findings and the diagnosis of OSA, its prognosis and treatment options. We talked about medical treatments, surgical interventions and non-pharmacological approaches. I explained in particular the risks and ramifications of untreated  moderate to severe OSA, especially with respect to developing cardiovascular disease down the Road, including congestive heart failure, difficult to treat hypertension, cardiac arrhythmias, or stroke. Even type 2 diabetes has, in part, been linked to untreated OSA. Symptoms of untreated OSA include daytime sleepiness, memory problems, mood irritability and mood disorder such as depression and anxiety, lack of energy, as well as recurrent headaches, especially morning headaches. We talked about smoking cessation and trying to maintain a healthy lifestyle in general, as well as the importance of weight control. I encouraged the patient to eat healthy, exercise daily and keep well hydrated, to keep a scheduled bedtime and wake time routine, to not skip any meals and eat healthy snacks in between meals. I advised the patient not to drive when feeling sleepy. I recommended the following at this time: sleep study with potential positive airway pressure titration. (We will score hypopneas at 3%).   I explained the sleep test procedure to the patient and also outlined possible surgical and non-surgical treatment options of OSA, including the use of a custom-made dental device (which would require a referral to a specialist dentist or oral surgeon), upper airway surgical options, such as pillar implants, radiofrequency surgery, tongue base surgery, and UPPP (which would involve a referral to an ENT surgeon). Rarely, jaw surgery such as mandibular advancement may be considered.  I also explained the CPAP treatment option to the patient, who indicated that she would be willing to try CPAP if the need arises. I explained the importance of being compliant with PAP treatment, not only for insurance purposes but primarily to improve Her symptoms, and for the patient's long term health benefit, including to reduce Her cardiovascular risks. I answered all her questions today and the patient was in agreement. I would like to see  her back after the sleep study is completed and encouraged her to call with any interim questions, concerns, problems or updates.   Thank you very much for allowing me to participate in the care of this nice patient. If I can be of any further assistance to you please do not hesitate to call me at [phone].  Sincerely,   Star Age, MD, PhD

## 2016-12-15 MED FILL — LOSARTAN POTASSIUM 50 MG TA: 50 | 30 days supply | Qty: 60 | Fill #4

## 2016-12-15 MED FILL — HYDROCHLOROTHIAZIDE 25 MG T: 25 | 30 days supply | Qty: 30 | Fill #4

## 2016-12-17 ENCOUNTER — Telehealth: Payer: Self-pay | Admitting: Neurology

## 2016-12-17 DIAGNOSIS — G4719 Other hypersomnia: Secondary | ICD-10-CM

## 2016-12-17 NOTE — Telephone Encounter (Signed)
I spoke to Dr. Rexene Alberts and she is agreeable to pt having an HST since she could not afford a split night study. Order placed.

## 2016-12-17 NOTE — Telephone Encounter (Signed)
Patient can't afford a Split sleep study and would like to have a HST.  Can I get an order for HST?

## 2016-12-17 NOTE — Addendum Note (Signed)
Addended by: Lester Saluda A on: 12/17/2016 01:09 PM   Modules accepted: Orders

## 2016-12-22 ENCOUNTER — Encounter (INDEPENDENT_AMBULATORY_CARE_PROVIDER_SITE_OTHER): Payer: Self-pay | Admitting: Physician Assistant

## 2016-12-22 ENCOUNTER — Ambulatory Visit (INDEPENDENT_AMBULATORY_CARE_PROVIDER_SITE_OTHER): Payer: Self-pay | Admitting: Physician Assistant

## 2016-12-22 VITALS — BP 135/92 | HR 82 | Temp 98.7°F | Wt 293.2 lb

## 2016-12-22 DIAGNOSIS — I10 Essential (primary) hypertension: Secondary | ICD-10-CM

## 2016-12-22 DIAGNOSIS — Z91199 Patient's noncompliance with other medical treatment and regimen due to unspecified reason: Secondary | ICD-10-CM

## 2016-12-22 DIAGNOSIS — F418 Other specified anxiety disorders: Secondary | ICD-10-CM

## 2016-12-22 DIAGNOSIS — Z9119 Patient's noncompliance with other medical treatment and regimen: Secondary | ICD-10-CM

## 2016-12-22 DIAGNOSIS — G4730 Sleep apnea, unspecified: Secondary | ICD-10-CM

## 2016-12-22 MED ORDER — HYDROCHLOROTHIAZIDE 25 MG PO TABS: 25.0000 mg | ORAL_TABLET | Freq: Every day | ORAL | 1 refills | Status: DC

## 2016-12-22 MED ORDER — SERTRALINE HCL 100 MG PO TABS: 100.0000 mg | ORAL_TABLET | Freq: Every day | ORAL | 1 refills | Status: DC

## 2016-12-22 MED ORDER — LOSARTAN POTASSIUM 50 MG PO TABS: 100.0000 mg | ORAL_TABLET | Freq: Every day | ORAL | 1 refills | Status: DC

## 2016-12-22 NOTE — Progress Notes (Signed)
Subjective:  Patient ID: Catherine [name], female    DOB: Mar 31, 1971  Age: 46 y.o. MRN: [id number]  CC: f/u HTN, med refills.  HPI Catherine [name] is a 46 y.o. female with a medical history of anxiety, depression, HTN, and bronchitis presents to f/u on HTN and to request medication refills. Last seen here on 10/07/16. BP was 120/83 while on HCTZ and Losartan. Has been out of "either Losartan or HCTZ"    Had a neurology consult done and is going to have an HST since she could not afford a split night study. Scheduled to pick up machine on 12/04/16.    Wants to lose weight and was prescribed Phentermine at one point. Has not filled Phentermine because she was waiting for her "life to get on track". Has not changed eating habits or began to exercise.     Patient states she had an anxiety episode recently. Thinks she was triggered by a hot flash. Tried black cohosh for hot flashes but made her feel "weird". Says she has not been taking Sertraline as directed.     Outpatient Medications Prior to Visit  Medication Sig Dispense Refill  . hydrochlorothiazide (HYDRODIURIL) 25 MG tablet Take 1 tablet (25 mg total) by mouth daily. Take on tablet in the morning. 90 tablet 1  . losartan (COZAAR) 50 MG tablet Take 2 tablets (100 mg total) by mouth daily. 180 tablet 1  . sertraline (ZOLOFT) 100 MG tablet Take 1 tablet (100 mg total) by mouth daily. 90 tablet 1   No facility-administered medications prior to visit.      ROS Review of Systems  Constitutional: Negative for chills, fever and malaise/fatigue.  Eyes: Negative for blurred vision.  Respiratory: Negative for shortness of breath.   Cardiovascular: Negative for chest pain and palpitations.  Gastrointestinal: Negative for abdominal pain and nausea.  Genitourinary: Negative for dysuria and hematuria.  Musculoskeletal: Negative for joint pain and myalgias.  Skin: Negative for rash.  Neurological: Negative for tingling and headaches.   Psychiatric/Behavioral: Negative for depression. The patient is nervous/anxious.     Objective:  BP (!) 135/92 (BP Location: Left Arm, Patient Position: Sitting, Cuff Size: Large)   Pulse 82   Temp 98.7 F (37.1 C) (Oral)   Wt 293 lb 3.2 oz (133 kg)   SpO2 94%   BMI 45.92 kg/m   BP/Weight 12/22/2016 11/26/2016 1/49/7026  Systolic BP 378 588 502  Diastolic BP 92 80 83  Wt. (Lbs) 293.2 291 293.4  BMI 45.92 45.58 47.36      Physical Exam  Constitutional: She is oriented to person, place, and time.  Well developed, obese, NAD, polite  HENT:  Head: Normocephalic and atraumatic.  Eyes: No scleral icterus.  Neck: Normal range of motion. Neck supple. No thyromegaly present.  Cardiovascular: Normal rate, regular rhythm and normal heart sounds.   Pulmonary/Chest: Effort normal and breath sounds normal.  Abdominal: Soft. Bowel sounds are normal. There is no tenderness.  Musculoskeletal: She exhibits no edema.  Neurological: She is alert and oriented to person, place, and time. No cranial nerve deficit. Coordination normal.  Skin: Skin is warm and dry. No rash noted. No erythema. No pallor.  Psychiatric: She has a normal mood and affect. Her behavior is normal. Thought content normal.  Vitals reviewed.    Assessment & Plan:   1. Hypertension, unspecified type - Noncompliant. Stressed the importance of controlling blood pressure to avoid complications such as stroke and MI.  - Refill losartan (COZAAR)  50 MG tablet; Take 2 tablets (100 mg total) by mouth daily.  Dispense: 180 tablet; Refill: 1 - Refill hydrochlorothiazide (HYDRODIURIL) 25 MG tablet; Take 1 tablet (25 mg total) by mouth daily. Take on tablet in the morning.  Dispense: 90 tablet; Refill: 1  2. Depression with anxiety - Non compliant, advised to take sertraline as directed. Consider down titrating at approximately 5 months from now.  - Refill sertraline (ZOLOFT) 100 MG tablet; Take 1 tablet (100 mg total) by mouth  daily.  Dispense: 90 tablet; Refill: 1  3. Sleep apnea, unspecified type - Awaiting home sleep study  4. Noncompliance   Meds ordered this encounter  Medications  . losartan (COZAAR) 50 MG tablet    Sig: Take 2 tablets (100 mg total) by mouth daily.    Dispense:  180 tablet    Refill:  1    Order Specific Question:   Supervising Provider    Answer:   Tresa Garter [id number]  . sertraline (ZOLOFT) 100 MG tablet    Sig: Take 1 tablet (100 mg total) by mouth daily.    Dispense:  90 tablet    Refill:  1    Order Specific Question:   Supervising Provider    Answer:   Tresa Garter [id number]  . hydrochlorothiazide (HYDRODIURIL) 25 MG tablet    Sig: Take 1 tablet (25 mg total) by mouth daily. Take on tablet in the morning.    Dispense:  90 tablet    Refill:  1    Order Specific Question:   Supervising Provider    Answer:   Tresa Garter [[id number]]    Follow-up: Return in about 2 weeks (around 01/05/2017) for physical.   Clent Demark PA

## 2016-12-22 NOTE — Patient Instructions (Signed)

## 2016-12-31 MED FILL — SERTRALINE HCL 100 MG TAB: 100 | 30 days supply | Qty: 30 | Fill #2

## 2017-01-04 ENCOUNTER — Ambulatory Visit (INDEPENDENT_AMBULATORY_CARE_PROVIDER_SITE_OTHER): Payer: PRIVATE HEALTH INSURANCE | Admitting: Neurology

## 2017-01-04 DIAGNOSIS — G4719 Other hypersomnia: Secondary | ICD-10-CM

## 2017-01-04 DIAGNOSIS — G4733 Obstructive sleep apnea (adult) (pediatric): Secondary | ICD-10-CM

## 2017-01-04 DIAGNOSIS — Z6841 Body Mass Index (BMI) 40.0 and over, adult: Secondary | ICD-10-CM

## 2017-01-06 ENCOUNTER — Telehealth: Payer: Self-pay

## 2017-01-06 NOTE — Telephone Encounter (Signed)
-----   Message from Star Age, MD sent at 01/06/2017  7:53 AM EDT ----- Patient referred by Domenica Fail, seen by me on 11/26/16, HST on 01/05/17:  Please call and notify the patient that the recent home sleep test did suggest the diagnosis of moderate obstructive sleep apnea and that I recommend treatment for this in the form of CPAP. I will request an overnight sleep study for proper titration and mask fitting. Please explain to patient and arrange for a CPAP titration study. I have placed an order in the chart. Thanks, and please route to [facility] for scheduling.   Star Age, MD, PhD [facility])

## 2017-01-06 NOTE — Telephone Encounter (Signed)
I called pt to discuss her sleep study results. No answer, left a message asking her to call me back. 

## 2017-01-06 NOTE — Telephone Encounter (Signed)
Pt returned RN's call. She can be reached up until 2:40(lunch), she then gets off work at 5.

## 2017-01-06 NOTE — Telephone Encounter (Signed)
I called pt. I advised pt that Dr. Rexene Alberts reviewed their sleep study results and found that pt does have moderate osa and Dr. Rexene Alberts recommends treatment in the form of a cpap. Dr. Rexene Alberts recommends that pt return for a repeat sleep study in order to properly titrate the cpap and ensure a good mask fit. Pt is agreeable to returning for a titration study. I advised pt that our sleep lab will file with pt's insurance and call pt to schedule the sleep study when we hear back from the pt's insurance regarding coverage of this sleep study. Pt verbalized understanding of results. Pt had no questions at this time but was encouraged to call back if questions arise.

## 2017-01-06 NOTE — Procedures (Signed)
[facility] [address] [place], [place] [address] NAME: Catherine [name] DOB: 21-Sep-1970 MEDICAL RECORD [id number] DOS: 01/04/17 REFERRING PHYSICIAN: Reggy Eye, PA-C STUDY PERFORMED: Home Sleep Study HISTORY: 46 year old woman with a history of hypertension, lower extremity edema, depression, anxiety and morbid obesity with a BMI of over 40, who reports snoring and excessive daytime somnolence. BMI of 45.5, Epworth sleepiness score of 9/24.  Sleep Study Data: Total Recording:    6 Hours, 1  Min Total Apnea/Hypopnea Index (AHI):   15.6/Hour Average Oxygen Saturation:    89% Lowest Oxygen Saturation:     78%  Time Oxygen Saturation Below 88%:   36% (126 min) Average Heart Rate:      77 BPM IMPRESSION: OSA RECOMMENDATION:  This home sleep test demonstrates overall moderate obstructive sleep apnea with a total AHI of 15.6/hour and O2 nadir of 78%. Given the patient's medical history and sleep related complaints, treatment with positive airway pressure (in the form of CPAP) is recommended. This will require a full night CPAP titration study for proper treatment settings and mask fitting. Please note that untreated obstructive sleep apnea carries additional perioperative morbidity. Patients with significant obstructive sleep apnea should receive perioperative PAP therapy and the surgeons and particularly the anesthesiologist should be informed of the diagnosis and the severity of the sleep disordered breathing. The patient should be cautioned not to drive, work at heights, or operate dangerous or heavy equipment when tired or sleepy. Review and reiteration of good sleep hygiene measures should be pursued with any patient. The patient and her referring provider will be notified of the test results. The patient will be seen in follow up in sleep clinic at [facility]. I certify that I have reviewed the raw data recording prior to the issuance of this report in  accordance with the standards of the American Academy of Sleep Medicine (AASM). Star Age, MD, PhD [facility]) Diplomat, ABPN (neurology and sleep)

## 2017-01-06 NOTE — Addendum Note (Signed)
Addended by: Star Age on: 01/06/2017 07:53 AM   Modules accepted: Orders

## 2017-01-06 NOTE — Progress Notes (Signed)
Patient referred by Domenica Fail, seen by me on 11/26/16, HST on 01/05/17:  Please call and notify the patient that the recent home sleep test did suggest the diagnosis of moderate obstructive sleep apnea and that I recommend treatment for this in the form of CPAP. I will request an overnight sleep study for proper titration and mask fitting. Please explain to patient and arrange for a CPAP titration study. I have placed an order in the chart. Thanks, and please route to [facility] for scheduling.   Star Age, MD, PhD [facility])

## 2017-01-08 ENCOUNTER — Ambulatory Visit (INDEPENDENT_AMBULATORY_CARE_PROVIDER_SITE_OTHER): Payer: Self-pay | Admitting: Physician Assistant

## 2017-01-14 ENCOUNTER — Other Ambulatory Visit (INDEPENDENT_AMBULATORY_CARE_PROVIDER_SITE_OTHER): Payer: Self-pay | Admitting: Physician Assistant

## 2017-01-14 DIAGNOSIS — I10 Essential (primary) hypertension: Secondary | ICD-10-CM

## 2017-01-14 NOTE — Telephone Encounter (Signed)
FWD to PCP. Catherine [name], CMA  

## 2017-01-18 ENCOUNTER — Telehealth: Payer: Self-pay | Admitting: Neurology

## 2017-01-18 DIAGNOSIS — G4733 Obstructive sleep apnea (adult) (pediatric): Secondary | ICD-10-CM

## 2017-01-18 MED FILL — LOSARTAN POTASSIUM 50 MG TA: 50 | 30 days supply | Qty: 60 | Fill #5

## 2017-01-18 MED FILL — HYDROCHLOROTHIAZIDE 25 MG T: 25 | 30 days supply | Qty: 30 | Fill #5

## 2017-01-18 NOTE — Telephone Encounter (Signed)
[place] denied CPAP suggest auto pap

## 2017-01-21 NOTE — Telephone Encounter (Signed)
We will set patient up with autoPAP at home, as insurance denied in house titration study for OSA, despite moderate OSA and decreased O2 sats. Pls process order and notify patient and set up FU in 10 weeks with me or NP.

## 2017-01-21 NOTE — Addendum Note (Signed)
Addended by: Star Age on: 01/21/2017 05:14 PM   Modules accepted: Orders

## 2017-01-26 NOTE — Telephone Encounter (Signed)
I called pt to discuss. No answer, left a message asking her to call me back. 

## 2017-01-26 NOTE — Telephone Encounter (Signed)
I called pt. I advised pt that her insurance denied her in lab sleep study. Dr. Rexene Alberts recommends that pt start an auto pap at home, instead. I reviewed PAP compliance expectations with the pt. Pt is agreeable to starting an auto-PAP. I advised pt that an order will be sent to a DME, [facility], and [facility] will call the pt within about one week after they file with the pt's insurance. [facility] will show the pt how to use the machine, fit for masks, and troubleshoot the auto-PAP if needed. A follow up appt was made for insurance purposes with Dr. Rexene Alberts on 04/22/17 at 3:00pm. Pt verbalized understanding to arrive 15 minutes early and bring their auto-PAP. A letter with all of this information in it will be mailed to the pt as a reminder. I verified with the pt that the address we have on file is correct. Pt verbalized understanding of results. Pt had no questions at this time but was encouraged to call back if questions arise.

## 2017-01-26 NOTE — Telephone Encounter (Signed)
Pt has returned the call to [employer].  She will have another break and can be reached from 3:30-3:45 and then will be off at 5:00

## 2017-02-10 MED FILL — SERTRALINE HCL 100 MG TAB: 100 | 30 days supply | Qty: 30 | Fill #3

## 2017-02-26 MED FILL — LOSARTAN POTASSIUM 50 MG TA: 50 | 30 days supply | Qty: 60 | Fill #0

## 2017-02-26 MED FILL — HYDROCHLOROTHIAZIDE 25 MG T: 25 | 30 days supply | Qty: 30 | Fill #0

## 2017-03-22 ENCOUNTER — Encounter (INDEPENDENT_AMBULATORY_CARE_PROVIDER_SITE_OTHER): Payer: Self-pay | Admitting: Physician Assistant

## 2017-03-22 ENCOUNTER — Other Ambulatory Visit: Payer: Self-pay

## 2017-03-22 ENCOUNTER — Ambulatory Visit (INDEPENDENT_AMBULATORY_CARE_PROVIDER_SITE_OTHER): Payer: Self-pay | Admitting: Physician Assistant

## 2017-03-22 VITALS — BP 137/87 | HR 82 | Temp 98.0°F | Wt 293.6 lb

## 2017-03-22 DIAGNOSIS — R059 Cough, unspecified: Secondary | ICD-10-CM

## 2017-03-22 DIAGNOSIS — J3489 Other specified disorders of nose and nasal sinuses: Secondary | ICD-10-CM

## 2017-03-22 DIAGNOSIS — R05 Cough: Secondary | ICD-10-CM

## 2017-03-22 MED ORDER — PHENYLEPHRINE-DM-GG-APAP 5-10-200-325 MG/10ML PO LIQD: 20.0000 mL | ORAL | 0 refills | Status: AC

## 2017-03-22 MED ORDER — HYDROCOD POLST-CPM POLST ER 10-8 MG/5ML PO SUER: 5.0000 mL | Freq: Two times a day (BID) | ORAL | 0 refills | Status: DC | PRN

## 2017-03-22 MED ORDER — AZITHROMYCIN 250 MG PO TABS: ORAL_TABLET | ORAL | 0 refills | Status: DC

## 2017-03-22 MED FILL — AZITHROMYCIN 250 MG TABLET: 250 | 5 days supply | Qty: 6 | Fill #0

## 2017-03-22 MED FILL — SERTRALINE HCL 100 MG TAB: 100 | 30 days supply | Qty: 30 | Fill #4

## 2017-03-22 NOTE — Patient Instructions (Signed)
Upper Respiratory Infection, Adult Most upper respiratory infections (URIs) are caused by a virus. A URI affects the nose, throat, and upper air passages. The most common type of URI is often called "the common cold." Follow these instructions at home:  Take medicines only as told by your doctor.  Gargle warm saltwater or take cough drops to comfort your throat as told by your doctor.  Use a warm mist humidifier or inhale steam from a shower to increase air moisture. This may make it easier to breathe.  Drink enough fluid to keep your pee (urine) clear or pale yellow.  Eat soups and other clear broths.  Have a healthy diet.  Rest as needed.  Go back to work when your fever is gone or your doctor says it is okay. ? You may need to stay home longer to avoid giving your URI to others. ? You can also wear a face mask and wash your hands often to prevent spread of the virus.  Use your inhaler more if you have asthma.  Do not use any tobacco products, including cigarettes, chewing tobacco, or electronic cigarettes. If you need help quitting, ask your doctor. Contact a doctor if:  You are getting worse, not better.  Your symptoms are not helped by medicine.  You have chills.  You are getting more short of breath.  You have brown or red mucus.  You have yellow or brown discharge from your nose.  You have pain in your face, especially when you bend forward.  You have a fever.  You have puffy (swollen) neck glands.  You have pain while swallowing.  You have white areas in the back of your throat. Get help right away if:  You have very bad or constant: ? Headache. ? Ear pain. ? Pain in your forehead, behind your eyes, and over your cheekbones (sinus pain). ? Chest pain.  You have long-lasting (chronic) lung disease and any of the following: ? Wheezing. ? Long-lasting cough. ? Coughing up blood. ? A change in your usual mucus.  You have a stiff neck.  You have  changes in your: ? Vision. ? Hearing. ? Thinking. ? Mood. This information is not intended to replace advice given to you by your health care provider. Make sure you discuss any questions you have with your health care provider. Document Released: 09/23/2007 Document Revised: 12/08/2015 Document Reviewed: 07/12/2013 Elsevier Interactive Patient Education  2018 [employer]vier Inc[employer]  

## 2017-03-22 NOTE — Progress Notes (Signed)
Subjective:  Patient ID: Catherine [name], female    DOB: [birth year]  Age: 46 y.o. MRN: [id number]  CC: URI  HPI  Catherine [name] is a 46 y.o. female with a medical history of anxiety, depression, HTN, and bronchitis presents with complaint of 7 day history of cough, malaise, and left frontal sinus pressure. Had chills this morning. Has not taken anything for relief. No close contacts with the same. Does not endorse any other symptoms.      Outpatient Medications Prior to Visit  Medication Sig Dispense Refill  . hydrochlorothiazide (HYDRODIURIL) 25 MG tablet Take 1 tablet (25 mg total) by mouth daily. Take on tablet in the morning. 90 tablet 1  . losartan (COZAAR) 50 MG tablet Take 2 tablets (100 mg total) by mouth daily. 180 tablet 1  . sertraline (ZOLOFT) 100 MG tablet Take 1 tablet (100 mg total) by mouth daily. 90 tablet 1  . hydrochlorothiazide (HYDRODIURIL) 25 MG tablet TAKE 1 TABLET BY MOUTH DAILY. TAKE ON TABLET IN THE MORNING. (Patient not taking: Reported on 03/22/2017) 90 tablet 1  . losartan (COZAAR) 50 MG tablet TAKE 2 TABLETS BY MOUTH DAILY. (Patient not taking: Reported on 03/22/2017) 180 tablet 1   No facility-administered medications prior to visit.      ROS Review of Systems  Constitutional: Negative for chills, fever and malaise/fatigue.  HENT: Positive for congestion and sinus pain. Negative for ear pain and sore throat.   Eyes: Negative for blurred vision and pain.  Respiratory: Negative for shortness of breath.   Cardiovascular: Negative for chest pain and palpitations.  Gastrointestinal: Negative for abdominal pain and nausea.  Genitourinary: Negative for dysuria and hematuria.  Musculoskeletal: Negative for joint pain and myalgias.  Skin: Negative for rash.  Neurological: Positive for headaches. Negative for tingling.  Psychiatric/Behavioral: Negative for depression. The patient is not nervous/anxious.     Objective:  BP 137/87 (BP Location: Left Arm,  Patient Position: Sitting, Cuff Size: Large)   Pulse 82   Temp 98 F (36.7 C) (Oral)   Wt 293 lb 9.6 oz (133.2 kg)   SpO2 92%   BMI 45.98 kg/m   BP/Weight 03/22/2017 12/27/2424 11/21/4194  Systolic BP 222 979 892  Diastolic BP 87 92 80  Wt. (Lbs) 293.6 293.2 291  BMI 45.98 45.92 45.58      Physical Exam  Constitutional: She is oriented to person, place, and time.  Well developed, obese, NAD, polite  HENT:  Head: Normocephalic and atraumatic.  Left sided frontal and maxillary sinus tenderness. Turbinates moderately hypertrophic bilaterally. TMs normal. Oropharynx mildly erythematous with no exudates.   Eyes: Conjunctivae are normal. No scleral icterus.  Neck: Normal range of motion. Neck supple. No thyromegaly present.  Cardiovascular: Normal rate, regular rhythm and normal heart sounds.  Pulmonary/Chest: Effort normal and breath sounds normal. No respiratory distress. She has no wheezes.  Musculoskeletal: She exhibits no edema.  Neurological: She is alert and oriented to person, place, and time. No cranial nerve deficit. Coordination normal.  Skin: Skin is warm and dry. No rash noted. No erythema. No pallor.  Psychiatric: She has a normal mood and affect. Her behavior is normal. Thought content normal.  Vitals reviewed.    Assessment & Plan:   1. Cough - Phenylephrine-DM-GG-APAP (MUCINEX FAST-MAX COLD FLU) 5-10-200-325 MG/10ML LIQD; Take 20 mLs by mouth every 4 (four) hours for 5 days.  Dispense: 1 Bottle; Refill: 0 - chlorpheniramine-HYDROcodone (TUSSIONEX PENNKINETIC ER) 10-8 MG/5ML SUER; Take 5 mLs by mouth  every 12 (twelve) hours as needed for cough.  Dispense: 140 mL; Refill: 0  2. Sinus pressure - Phenylephrine-DM-GG-APAP (MUCINEX FAST-MAX COLD FLU) 5-10-200-325 MG/10ML LIQD; Take 20 mLs by mouth every 4 (four) hours for 5 days.  Dispense: 1 Bottle; Refill: 0 - azithromycin (ZITHROMAX) 250 MG tablet; Take two tablets today then take one tablet every day thereafter.   Dispense: 6 tablet; Refill: 0   Meds ordered this encounter  Medications  . Phenylephrine-DM-GG-APAP (MUCINEX FAST-MAX COLD FLU) 5-10-200-325 MG/10ML LIQD    Sig: Take 20 mLs by mouth every 4 (four) hours for 5 days.    Dispense:  1 Bottle    Refill:  0    Order Specific Question:   Supervising Provider    Answer:   Tresa Garter [id number]  . chlorpheniramine-HYDROcodone (TUSSIONEX PENNKINETIC ER) 10-8 MG/5ML SUER    Sig: Take 5 mLs by mouth every 12 (twelve) hours as needed for cough.    Dispense:  140 mL    Refill:  0    Order Specific Question:   Supervising Provider    Answer:   Tresa Garter [id number]  . azithromycin (ZITHROMAX) 250 MG tablet    Sig: Take two tablets today then take one tablet every day thereafter.    Dispense:  6 tablet    Refill:  0    Order Specific Question:   Supervising Provider    Answer:   Tresa Garter [id number]    Follow-up: Return if symptoms worsen or fail to improve.   Clent Demark PA

## 2017-03-31 MED FILL — HYDROCHLOROTHIAZIDE 25 MG T: 25 | 30 days supply | Qty: 30 | Fill #1

## 2017-03-31 MED FILL — LOSARTAN POTASSIUM 50 MG TA: 50 | 30 days supply | Qty: 60 | Fill #1

## 2017-04-02 ENCOUNTER — Ambulatory Visit: Payer: Self-pay

## 2017-04-16 ENCOUNTER — Other Ambulatory Visit: Payer: Self-pay

## 2017-04-16 ENCOUNTER — Encounter (HOSPITAL_COMMUNITY): Payer: Self-pay | Admitting: Emergency Medicine

## 2017-04-16 ENCOUNTER — Emergency Department (HOSPITAL_COMMUNITY)
Admission: EM | Admit: 2017-04-16 | Discharge: 2017-04-16 | Disposition: A | Discharge status: Home / Self Care | Payer: Self-pay | Attending: Emergency Medicine | Admitting: Emergency Medicine

## 2017-04-16 ENCOUNTER — Emergency Department (HOSPITAL_COMMUNITY): Payer: Self-pay

## 2017-04-16 DIAGNOSIS — Z79899 Other long term (current) drug therapy: Secondary | ICD-10-CM | POA: Insufficient documentation

## 2017-04-16 DIAGNOSIS — I10 Essential (primary) hypertension: Secondary | ICD-10-CM | POA: Insufficient documentation

## 2017-04-16 DIAGNOSIS — B349 Viral infection, unspecified: Secondary | ICD-10-CM | POA: Insufficient documentation

## 2017-04-16 DIAGNOSIS — F17210 Nicotine dependence, cigarettes, uncomplicated: Secondary | ICD-10-CM | POA: Insufficient documentation

## 2017-04-16 LAB — POC URINE PREG, ED: PREG TEST UR: NEGATIVE

## 2017-04-16 MED ORDER — ALBUTEROL SULFATE HFA 108 (90 BASE) MCG/ACT IN AERS: 1.0000 | INHALATION_SPRAY | Freq: Four times a day (QID) | RESPIRATORY_TRACT | 0 refills | Status: DC | PRN

## 2017-04-16 MED ORDER — BENZONATATE 100 MG PO CAPS: 100.0000 mg | ORAL_CAPSULE | Freq: Three times a day (TID) | ORAL | 0 refills | Status: DC | PRN

## 2017-04-16 MED ORDER — FLUTICASONE PROPIONATE 50 MCG/ACT NA SUSP: 1.0000 | Freq: Every day | NASAL | 2 refills | Status: DC

## 2017-04-16 MED ORDER — ALBUTEROL SULFATE (2.5 MG/3ML) 0.083% IN NEBU
2.5000 mg | INHALATION_SOLUTION | Freq: Once | RESPIRATORY_TRACT | Status: AC
  Administered 2017-04-16: 2.5 mg via RESPIRATORY_TRACT
  Filled 2017-04-16: qty 3

## 2017-04-16 MED ORDER — ALBUTEROL SULFATE (2.5 MG/3ML) 0.083% IN NEBU: 2.5000 mg | INHALATION_SOLUTION | Freq: Once | RESPIRATORY_TRACT | Status: DC

## 2017-04-16 MED ORDER — PREDNISONE 10 MG (21) PO TBPK: ORAL_TABLET | Freq: Every day | ORAL | 0 refills | Status: DC

## 2017-04-16 MED FILL — !VENTOLIN HFA INHALER: 108 (90 BAS | 25 days supply | Qty: 18 | Fill #0

## 2017-04-16 MED FILL — BENZONATATE 100 MG CAPSULE: 100 | 7 days supply | Qty: 21 | Fill #0

## 2017-04-16 MED FILL — ?PREDNISONE 10 MG TABLET: 10 | 6 days supply | Qty: 21 | Fill #0

## 2017-04-16 NOTE — ED Triage Notes (Signed)
Pt. Stated, Catherine [name] had a cough, congestion since last Sunday.

## 2017-04-16 NOTE — ED Provider Notes (Signed)
[facility] EMERGENCY DEPARTMENT Provider Note   CSN: [id number] Arrival date & time: 04/16/17  0814     History   Chief Complaint Chief Complaint  Patient presents with  . Cough  . Nasal Congestion    HPI Catherine [name] is a 46 y.o. female with a hx of tobacco use and hypertension presents to the emergency department complaining of cough that has been progressively worsening over the past 6 days.  Patient states that she was sick with URI symptoms 3 weeks prior, placed on a Z-Pak and given cough medicine with some improvement.  States that she feels as though her symptoms never truly resolved but it did get better following these interventions.  States that about 6 days ago she started to feel like she was getting congested with dry cough.  Developed some sinus pressure and sore throat.  States that the cough has since become productive with yellow mucus and has also developed some wheezing.  Has had subjective fevers and chills at home. Has tried OTC cold medicine without relief. Denies chest pain, dyspnea, lightheadedness, dizziness, or palpitations.   HPI  Past Medical History:  Diagnosis Date  . Anxiety   . Bronchitis   . Depression   . Hypertension     Patient Active Problem List   Diagnosis Date Noted  . Hypertension 07/29/2016  . Obesity with serious comorbidity 07/29/2016    Past Surgical History:  Procedure Laterality Date  . BUNIONECTOMY  2012  . CESAREAN SECTION     X 2  . LAVH  2009   Leiomyoma and menorrhagia  . MYOMECTOMY  2005    OB History    Gravida Para Term Preterm AB Living   4 2     2 2    SAB TAB Ectopic Multiple Live Births   2               Home Medications    Prior to Admission medications   Medication Sig Start Date End Date Taking? Authorizing Provider  azithromycin (ZITHROMAX) 250 MG tablet Take two tablets today then take one tablet every day thereafter. 03/22/17   Clent Demark, PA-C    chlorpheniramine-HYDROcodone [facility] ER) 10-8 MG/5ML SUER Take 5 mLs by mouth every 12 (twelve) hours as needed for cough. 03/22/17   Clent Demark, PA-C  hydrochlorothiazide (HYDRODIURIL) 25 MG tablet Take 1 tablet (25 mg total) by mouth daily. Take on tablet in the morning. 12/22/16   Clent Demark, PA-C  hydrochlorothiazide (HYDRODIURIL) 25 MG tablet TAKE 1 TABLET BY MOUTH DAILY. TAKE ON TABLET IN THE MORNING. Patient not taking: Reported on 03/22/2017 01/14/17   Clent Demark, PA-C  losartan (COZAAR) 50 MG tablet Take 2 tablets (100 mg total) by mouth daily. 12/22/16   Clent Demark, PA-C  losartan (COZAAR) 50 MG tablet TAKE 2 TABLETS BY MOUTH DAILY. Patient not taking: Reported on 03/22/2017 01/14/17   Clent Demark, PA-C  sertraline (ZOLOFT) 100 MG tablet Take 1 tablet (100 mg total) by mouth daily. 12/22/16   Clent Demark, PA-C    Family History Family History  Problem Relation Age of Onset  . Hypertension Mother   . Hypertension Father   . Hypertension Sister     Social History Social History   Tobacco Use  . Smoking status: Current Every Day Smoker    Packs/day: 0.25    Types: Cigarettes  . Smokeless tobacco: Never Used  Substance Use Topics  .  Alcohol use: Yes    Alcohol/week: 0.0 oz    Comment: socially  . Drug use: No     Allergies   Latex   Review of Systems Review of Systems  Constitutional: Positive for chills and fever (subjective).  HENT: Positive for congestion, rhinorrhea, sinus pressure and sore throat. Negative for ear pain.   Respiratory: Positive for cough and wheezing. Negative for shortness of breath.   Cardiovascular: Negative for chest pain and palpitations.  Gastrointestinal: Negative for abdominal pain.  Neurological: Negative for dizziness, syncope and light-headedness.  All other systems reviewed and are negative.    Physical Exam Updated Vital Signs BP (!) 130/94 (BP Location: Left Arm)   Pulse  86   Temp 98.4 F (36.9 C) (Oral)   Resp 17   Ht 5\' 7"  (1.702 m)   Wt 132.9 kg (293 lb)   SpO2 96%   BMI 45.89 kg/m   Physical Exam  Constitutional: She appears well-developed and well-nourished.  Non-toxic appearance. No distress.  HENT:  Head: Normocephalic and atraumatic.  Right Ear: Tympanic membrane is not perforated, not erythematous, not retracted and not bulging.  Left Ear: Tympanic membrane is not perforated, not erythematous, not retracted and not bulging.  Nose: Mucosal edema and rhinorrhea present.  Mouth/Throat: Uvula is midline and oropharynx is clear and moist. No oropharyngeal exudate or posterior oropharyngeal erythema.  Eyes: Conjunctivae are normal. Pupils are equal, round, and reactive to light. Right eye exhibits no discharge. Left eye exhibits no discharge.  Neck: Normal range of motion. Neck supple.  Cardiovascular: Normal rate and regular rhythm.  No murmur heard. Pulmonary/Chest: No accessory muscle usage. No respiratory distress. She has wheezes (mild expiratory at bases). She has rhonchi (clear with coughing at bases). She has no rales.  Abdominal: Soft. She exhibits no distension. There is no tenderness.  Lymphadenopathy:    She has no cervical adenopathy.  Neurological: She is alert.  Skin: Skin is warm and dry. No rash noted.  Psychiatric: She has a normal mood and affect. Her behavior is normal.  Nursing note and vitals reviewed.   ED Treatments / Results  Labs (all labs ordered are listed, but only abnormal results are displayed) Labs Reviewed  POC URINE PREG, ED   EKG  EKG Interpretation None      Radiology Dg Chest 2 View  Result Date: 04/16/2017 CLINICAL DATA:  Cough, congestion, and chills for the past week. EXAM: CHEST  2 VIEW COMPARISON:  Chest x-ray dated June 28, 2016. FINDINGS: The cardiomediastinal silhouette is normal in size. Normal pulmonary vascularity. Mild central peribronchial thickening. Scattered areas of linear  scarring in both lungs are unchanged. No focal consolidation, pleural effusion, or pneumothorax. No acute osseous abnormality. IMPRESSION: Mild central peribronchial thickening thickening, consistent with airways inflammation. No consolidation. Electronically Signed   By: Titus Dubin M.D.   On: 04/16/2017 11:02   Procedures Procedures (including critical care time)  Medications Ordered in ED Medications  albuterol (PROVENTIL) (2.5 MG/3ML) 0.083% nebulizer solution 2.5 mg (2.5 mg Nebulization Given 04/16/17 1009)    Initial Impression / Assessment and Plan / ED Course  I have reviewed the triage vital signs and the nursing notes.  Pertinent labs & imaging results that were available during my care of the patient were reviewed by me and considered in my medical decision making (see chart for details).    Patient presents with symptoms consistent with viral illness. She is nontoxic appearing with stable vital signs.  No respiratory distress.  Patient is afebrile and without adventitious sounds on lung exam, CXR negative for infiltrate, doubt PNA.  Afebrile, no sinus tenderness, doubt sinusitis. Centor score 0, doubt strep pharyngitis.  Patient with mild expiratory wheezing at the bases, symptoms improved following breathing treatment. Suspect viral etiology, will treat supportively with  Flonase and Tessalon, will also give short course of steroids and an inhaler given wheezing. I discussed results, treatment plan, need for PCP follow-up, and return precautions with the patient. Provided opportunity for questions, patient confirmed understanding and is in agreement with plan.   Final Clinical Impressions(s) / ED Diagnoses   Final diagnoses:  Viral illness    ED Discharge Orders        Ordered    albuterol (PROVENTIL HFA;VENTOLIN HFA) 108 (90 Base) MCG/ACT inhaler  Every 6 hours PRN     04/16/17 1114    predniSONE (STERAPRED UNI-PAK 21 TAB) 10 MG (21) TBPK tablet  Daily     04/16/17 1114     fluticasone (FLONASE) 50 MCG/ACT nasal spray  Daily     04/16/17 1114    benzonatate (TESSALON) 100 MG capsule  3 times daily PRN     04/16/17 1114       Jamisha Hoeschen, Marlow R, PA-C 04/16/17 1750    Pattricia Boss, MD 04/18/17 1243

## 2017-04-16 NOTE — Discharge Instructions (Signed)
You were seen in the emergency today and diagnosed with a viral illness.  I have prescribed you multiple medications to treat your symptoms.   -Flonase to be used 1 spray in each nostril daily.  This medication is used to treat your congestion.  -Tessalon can be taken once every 8 hours as needed.  This medication is used to treat your cough.  -Inhaler- this can be used to help with wheezing and difficulty breathing- use 1-2 puffs every 6 hours as needed  - Prednisone- This is a steroid, take as directed, it will help with inflammation  You will need to follow-up with your primary care provider in 1 week if your symptoms have not improved.  If you do not have a primary care provider one is provided in your discharge instructions.  Return to the emergency department for any new or worsening symptoms including but not limited to persistent fever for 5 days, difficulty breathing, chest pain, or passing out.

## 2017-04-22 ENCOUNTER — Ambulatory Visit: Payer: Self-pay | Admitting: Neurology

## 2017-04-27 MED FILL — SERTRALINE HCL 100 MG TAB: 100 | 30 days supply | Qty: 30 | Fill #5

## 2017-04-28 ENCOUNTER — Telehealth: Payer: Self-pay

## 2017-04-28 NOTE — Telephone Encounter (Signed)
I called pt. She has not started auto pap yet. I'm not sure that she needs her appt tomorrow with Dr. Rexene Alberts at this point, unless she wants to discuss her sleep study results.  No answer, left a message asking her to call me back.

## 2017-04-29 ENCOUNTER — Ambulatory Visit: Payer: Self-pay | Admitting: Neurology

## 2017-04-29 ENCOUNTER — Encounter: Payer: Self-pay | Admitting: Neurology

## 2017-04-30 MED FILL — HYDROCHLOROTHIAZIDE 25 MG T: 25 | 30 days supply | Qty: 30 | Fill #2

## 2017-04-30 MED FILL — LOSARTAN POTASSIUM 50 MG TA: 50 | 30 days supply | Qty: 60 | Fill #2

## 2017-05-07 ENCOUNTER — Ambulatory Visit: Payer: Self-pay

## 2017-06-01 MED FILL — HYDROCHLOROTHIAZIDE 25 MG T: 25 | 30 days supply | Qty: 30 | Fill #3

## 2017-06-01 MED FILL — LOSARTAN POTASSIUM 50 MG TA: 50 | 30 days supply | Qty: 60 | Fill #3

## 2017-06-25 ENCOUNTER — Telehealth (INDEPENDENT_AMBULATORY_CARE_PROVIDER_SITE_OTHER): Payer: Self-pay

## 2017-06-25 ENCOUNTER — Other Ambulatory Visit (INDEPENDENT_AMBULATORY_CARE_PROVIDER_SITE_OTHER): Payer: Self-pay | Admitting: Physician Assistant

## 2017-06-25 DIAGNOSIS — F418 Other specified anxiety disorders: Secondary | ICD-10-CM

## 2017-06-25 MED ORDER — SERTRALINE HCL 100 MG PO TABS: 100.0000 mg | ORAL_TABLET | Freq: Every day | ORAL | 1 refills | Status: DC

## 2017-06-25 NOTE — Telephone Encounter (Signed)
Patient is asking for a refill of her Sertraline sent to [facility] pharmacy. Patient states she had a severe panic attack and was transported to ED recently. Nat Christen, CMA

## 2017-06-25 NOTE — Telephone Encounter (Signed)
I have refilled sertraline. She has to make an appointment to be seen here within 4 weeks.

## 2017-06-28 MED FILL — SERTRALINE HCL 100 MG TAB: 100 | 30 days supply | Qty: 30 | Fill #0

## 2017-06-28 NOTE — Telephone Encounter (Signed)
Left patient a voicemail informing of refill being sent and the need for patient to schedule an appointment at the clinic in 4 weeks. Nat Christen, CMA

## 2017-07-07 MED FILL — LOSARTAN POTASSIUM 50 MG TA: 50 | 30 days supply | Qty: 60 | Fill #4

## 2017-07-07 MED FILL — HYDROCHLOROTHIAZIDE 25 MG T: 25 | 30 days supply | Qty: 30 | Fill #4

## 2017-07-27 ENCOUNTER — Ambulatory Visit (INDEPENDENT_AMBULATORY_CARE_PROVIDER_SITE_OTHER): Payer: Self-pay | Admitting: Physician Assistant

## 2017-08-09 MED FILL — LOSARTAN POTASSIUM 50 MG TA: 50 | 30 days supply | Qty: 60 | Fill #5

## 2017-08-09 MED FILL — SERTRALINE HCL 100 MG TAB: 100 | 30 days supply | Qty: 30 | Fill #1

## 2017-08-09 MED FILL — HYDROCHLOROTHIAZIDE 25 MG T: 25 | 30 days supply | Qty: 30 | Fill #5

## 2017-08-24 ENCOUNTER — Ambulatory Visit (INDEPENDENT_AMBULATORY_CARE_PROVIDER_SITE_OTHER): Payer: Self-pay | Admitting: Physician Assistant

## 2017-08-24 ENCOUNTER — Encounter (INDEPENDENT_AMBULATORY_CARE_PROVIDER_SITE_OTHER): Payer: Self-pay | Admitting: Physician Assistant

## 2017-08-24 VITALS — BP 115/77 | HR 92 | Temp 97.8°F | Resp 18 | Ht 67.0 in | Wt 292.0 lb

## 2017-08-24 DIAGNOSIS — F418 Other specified anxiety disorders: Secondary | ICD-10-CM

## 2017-08-24 DIAGNOSIS — Z760 Encounter for issue of repeat prescription: Secondary | ICD-10-CM

## 2017-08-24 MED ORDER — LOSARTAN POTASSIUM 50 MG PO TABS: 100.0000 mg | ORAL_TABLET | Freq: Every day | ORAL | 1 refills | Status: DC

## 2017-08-24 MED ORDER — HYDROCHLOROTHIAZIDE 25 MG PO TABS
ORAL_TABLET | ORAL | 1 refills | Status: DC
Start: 2017-08-24 — End: 2017-11-09

## 2017-08-24 MED ORDER — ALBUTEROL SULFATE HFA 108 (90 BASE) MCG/ACT IN AERS: 1.0000 | INHALATION_SPRAY | Freq: Four times a day (QID) | RESPIRATORY_TRACT | 0 refills | Status: DC | PRN

## 2017-08-24 MED ORDER — CLONAZEPAM 0.5 MG PO TABS: 0.5000 mg | ORAL_TABLET | Freq: Every day | ORAL | 0 refills | Status: DC

## 2017-08-24 MED ORDER — ESCITALOPRAM OXALATE 20 MG PO TABS: 20.0000 mg | ORAL_TABLET | Freq: Every day | ORAL | 5 refills | Status: DC

## 2017-08-24 NOTE — Progress Notes (Signed)
Subjective:  Patient ID: Catherine [name], female    DOB: [birth year]  Age: 47 y.o. MRN: [id number]  CC: depression with anxiety  HPI Catherine Turnbaugh Lemonsis a 47 y.o.femalewith a medical history of anxiety, depression, HTN, noncompliance, and bronchitis presents to f/u on depression with anxiety. She had run out of medications and felt she "spiraled out of control" in regards to depression and anxiety. She has had many stressors and lack of sleep that she feels led her to a nervous breakdown. Was out of medication for seven days at the point of her nervous breakdown. Has not been able to identify any specific triggers. PHQ9 8 and GAD7 6 today. She feels anhedonic, irritable, and anxious. Does not endorse any other symptoms or complaints.     Outpatient Medications Prior to Visit  Medication Sig Dispense Refill  . albuterol (PROVENTIL HFA;VENTOLIN HFA) 108 (90 Base) MCG/ACT inhaler Inhale 1-2 puffs into the lungs every 6 (six) hours as needed for wheezing or shortness of breath. 1 Inhaler 0  . hydrochlorothiazide (HYDRODIURIL) 25 MG tablet TAKE 1 TABLET BY MOUTH DAILY. TAKE ON TABLET IN THE MORNING. 90 tablet 1  . losartan (COZAAR) 50 MG tablet Take 2 tablets (100 mg total) by mouth daily. 180 tablet 1  . sertraline (ZOLOFT) 100 MG tablet Take 1 tablet (100 mg total) by mouth daily. 30 tablet 1  . hydrochlorothiazide (HYDRODIURIL) 25 MG tablet Take 1 tablet (25 mg total) by mouth daily. Take on tablet in the morning. 90 tablet 1  . azithromycin (ZITHROMAX) 250 MG tablet Take two tablets today then take one tablet every day thereafter. 6 tablet 0  . benzonatate (TESSALON) 100 MG capsule Take 1 capsule (100 mg total) by mouth 3 (three) times daily as needed for cough. 21 capsule 0  . chlorpheniramine-HYDROcodone (TUSSIONEX PENNKINETIC ER) 10-8 MG/5ML SUER Take 5 mLs by mouth every 12 (twelve) hours as needed for cough. 140 mL 0  . fluticasone (FLONASE) 50 MCG/ACT nasal spray Place 1 spray into both  nostrils daily. 16 g 2  . losartan (COZAAR) 50 MG tablet TAKE 2 TABLETS BY MOUTH DAILY. (Patient not taking: Reported on 03/22/2017) 180 tablet 1  . predniSONE (STERAPRED UNI-PAK 21 TAB) 10 MG (21) TBPK tablet Take by mouth daily. 6, 5, 4, 3, 2, 1 Take as written 21 tablet 0   No facility-administered medications prior to visit.      ROS Review of Systems  Constitutional: Negative for chills, fever and malaise/fatigue.  Eyes: Negative for blurred vision.  Respiratory: Negative for shortness of breath.   Cardiovascular: Negative for chest pain and palpitations.  Gastrointestinal: Negative for abdominal pain and nausea.  Genitourinary: Negative for dysuria and hematuria.  Musculoskeletal: Negative for joint pain and myalgias.  Skin: Negative for rash.  Neurological: Negative for tingling and headaches.  Psychiatric/Behavioral: Positive for depression. The patient is nervous/anxious.     Objective:  BP 115/77 (BP Location: Left Arm, Patient Position: Sitting, Cuff Size: Large)   Pulse 92   Temp 97.8 F (36.6 C) (Oral)   Resp 18   Ht 5\' 7"  (1.702 m)   Wt 292 lb (132.5 kg)   SpO2 100%   BMI 45.73 kg/m   BP/Weight 08/24/2017 04/16/2017 80/12/9831  Systolic BP 825 053 976  Diastolic BP 77 94 87  Wt. (Lbs) 292 293 293.6  BMI 45.73 45.89 45.98      Physical Exam  Constitutional: She is oriented to person, place, and time.  Well developed, well  nourished, NAD, polite  HENT:  Head: Normocephalic and atraumatic.  Eyes: No scleral icterus.  Neck: Normal range of motion. Neck supple. No thyromegaly present.  Cardiovascular: Normal rate, regular rhythm and normal heart sounds.  Pulmonary/Chest: Effort normal and breath sounds normal.  Musculoskeletal: She exhibits no edema.  Neurological: She is alert and oriented to person, place, and time.  Skin: Skin is warm and dry. No rash noted. No erythema. No pallor.  Psychiatric: She has a normal mood and affect. Her behavior is normal.  Thought content normal.  Vitals reviewed.    Assessment & Plan:      1. Depression with anxiety - Begin escitalopram (LEXAPRO) 20 MG tablet; Take 1 tablet (20 mg total) by mouth daily.  Dispense: 30 tablet; Refill: 5 - Begin clonazePAM (KLONOPIN) 0.5 MG tablet; Take 1 tablet (0.5 mg total) by mouth at bedtime.  Dispense: 30 tablet; Refill: 0  2. Medication refill - losartan (COZAAR) 50 MG tablet; Take 2 tablets (100 mg total) by mouth daily.  Dispense: 180 tablet; Refill: 1 - hydrochlorothiazide (HYDRODIURIL) 25 MG tablet; TAKE 1 TABLET BY MOUTH DAILY. TAKE ON TABLET IN THE MORNING.  Dispense: 90 tablet; Refill: 1 - albuterol (PROVENTIL HFA;VENTOLIN HFA) 108 (90 Base) MCG/ACT inhaler; Inhale 1-2 puffs into the lungs every 6 (six) hours as needed for wheezing or shortness of breath.  Dispense: 1 Inhaler; Refill: 0   Meds ordered this encounter  Medications  . escitalopram (LEXAPRO) 20 MG tablet    Sig: Take 1 tablet (20 mg total) by mouth daily.    Dispense:  30 tablet    Refill:  5    Order Specific Question:   Supervising Provider    Answer:   Tresa Garter [id number]  . clonazePAM (KLONOPIN) 0.5 MG tablet    Sig: Take 1 tablet (0.5 mg total) by mouth at bedtime.    Dispense:  30 tablet    Refill:  0    Order Specific Question:   Supervising Provider    Answer:   Tresa Garter [id number]  . losartan (COZAAR) 50 MG tablet    Sig: Take 2 tablets (100 mg total) by mouth daily.    Dispense:  180 tablet    Refill:  1    Order Specific Question:   Supervising Provider    Answer:   Tresa Garter [id number]  . hydrochlorothiazide (HYDRODIURIL) 25 MG tablet    Sig: TAKE 1 TABLET BY MOUTH DAILY. TAKE ON TABLET IN THE MORNING.    Dispense:  90 tablet    Refill:  1    Order Specific Question:   Supervising Provider    Answer:   Tresa Garter [id number]  . albuterol (PROVENTIL HFA;VENTOLIN HFA) 108 (90 Base) MCG/ACT inhaler    Sig: Inhale 1-2 puffs into the  lungs every 6 (six) hours as needed for wheezing or shortness of breath.    Dispense:  1 Inhaler    Refill:  0    Order Specific Question:   Supervising Provider    Answer:   Tresa Garter [[id number]]    Follow-up: 4 weeks  Clent Demark PA

## 2017-08-24 NOTE — Patient Instructions (Signed)

## 2017-09-10 MED FILL — !VENTOLIN HFA INHALER: 108 (90 BAS | 25 days supply | Qty: 18 | Fill #0

## 2017-09-10 MED FILL — ESCITALOPRAM 20 MG TABLET: 20 | 30 days supply | Qty: 30 | Fill #0

## 2017-09-10 MED FILL — HYDROCHLOROTHIAZIDE 25 MG T: 25 | 30 days supply | Qty: 30 | Fill #0

## 2017-09-14 MED FILL — LOSARTAN POTASSIUM 50 MG TA: 50 | 30 days supply | Qty: 60 | Fill #0

## 2017-09-28 ENCOUNTER — Ambulatory Visit (INDEPENDENT_AMBULATORY_CARE_PROVIDER_SITE_OTHER): Payer: Self-pay | Admitting: Physician Assistant

## 2017-10-18 MED FILL — LOSARTAN POTASSIUM 50 MG TA: 50 | 30 days supply | Qty: 60 | Fill #1

## 2017-10-18 MED FILL — HYDROCHLOROTHIAZIDE 25 MG T: 25 | 30 days supply | Qty: 30 | Fill #1

## 2017-11-09 ENCOUNTER — Ambulatory Visit (INDEPENDENT_AMBULATORY_CARE_PROVIDER_SITE_OTHER): Payer: Self-pay | Admitting: Physician Assistant

## 2017-11-09 ENCOUNTER — Other Ambulatory Visit: Payer: Self-pay

## 2017-11-09 ENCOUNTER — Encounter (INDEPENDENT_AMBULATORY_CARE_PROVIDER_SITE_OTHER): Payer: Self-pay | Admitting: Physician Assistant

## 2017-11-09 VITALS — BP 136/87 | HR 79 | Temp 97.7°F | Ht 67.0 in | Wt 295.4 lb

## 2017-11-09 DIAGNOSIS — Z23 Encounter for immunization: Secondary | ICD-10-CM

## 2017-11-09 DIAGNOSIS — R7303 Prediabetes: Secondary | ICD-10-CM | POA: Insufficient documentation

## 2017-11-09 DIAGNOSIS — F418 Other specified anxiety disorders: Secondary | ICD-10-CM

## 2017-11-09 DIAGNOSIS — R739 Hyperglycemia, unspecified: Secondary | ICD-10-CM

## 2017-11-09 DIAGNOSIS — Z114 Encounter for screening for human immunodeficiency virus [HIV]: Secondary | ICD-10-CM

## 2017-11-09 DIAGNOSIS — Z760 Encounter for issue of repeat prescription: Secondary | ICD-10-CM

## 2017-11-09 LAB — POCT GLYCOSYLATED HEMOGLOBIN (HGB A1C): Hemoglobin A1C: 6.3 % — AB (ref 4.0–5.6)

## 2017-11-09 MED ORDER — ALBUTEROL SULFATE HFA 108 (90 BASE) MCG/ACT IN AERS: 1.0000 | INHALATION_SPRAY | Freq: Four times a day (QID) | RESPIRATORY_TRACT | 3 refills | Status: DC | PRN

## 2017-11-09 MED ORDER — LOSARTAN POTASSIUM 50 MG PO TABS: 100.0000 mg | ORAL_TABLET | Freq: Every day | ORAL | 5 refills | Status: DC

## 2017-11-09 MED ORDER — METFORMIN HCL ER 500 MG PO TB24: 500.0000 mg | ORAL_TABLET | Freq: Every day | ORAL | 11 refills | Status: DC

## 2017-11-09 MED ORDER — HYDROXYZINE HCL 10 MG PO TABS: 10.0000 mg | ORAL_TABLET | Freq: Three times a day (TID) | ORAL | 2 refills | Status: DC | PRN

## 2017-11-09 MED ORDER — ESCITALOPRAM OXALATE 20 MG PO TABS: 20.0000 mg | ORAL_TABLET | Freq: Every day | ORAL | 5 refills | Status: DC

## 2017-11-09 MED ORDER — HYDROCHLOROTHIAZIDE 25 MG PO TABS: ORAL_TABLET | ORAL | 5 refills | Status: DC

## 2017-11-09 NOTE — Patient Instructions (Signed)
Prediabetes Eating Plan Prediabetes-also called impaired glucose tolerance or impaired fasting glucose-is a condition that causes blood sugar (blood glucose) levels to be higher than normal. Following a healthy diet can help to keep prediabetes under control. It can also help to lower the risk of type 2 diabetes and heart disease, which are increased in people who have prediabetes. Along with regular exercise, a healthy diet:  Promotes weight loss.  Helps to control blood sugar levels.  Helps to improve the way that the body uses insulin.  What do I need to know about this eating plan?  Use the glycemic index (GI) to plan your meals. The index tells you how quickly a food will raise your blood sugar. Choose low-GI foods. These foods take a longer time to raise blood sugar.  Pay close attention to the amount of carbohydrates in the food that you eat. Carbohydrates increase blood sugar levels.  Keep track of how many calories you take in. Eating the right amount of calories will help you to achieve a healthy weight. Losing about 7 percent of your starting weight can help to prevent type 2 diabetes.  You may want to follow a Mediterranean diet. This diet includes a lot of vegetables, lean meats or fish, whole grains, fruits, and healthy oils and fats. What foods can I eat? Grains Whole grains, such as whole-wheat or whole-grain breads, crackers, cereals, and pasta. Unsweetened oatmeal. Bulgur. Barley. Quinoa. Brown rice. Corn or whole-wheat flour tortillas or taco shells. Vegetables Lettuce. Spinach. Peas. Beets. Cauliflower. Cabbage. Broccoli. Carrots. Tomatoes. Squash. Eggplant. Herbs. Peppers. Onions. Cucumbers. Brussels sprouts. Fruits Berries. Bananas. Apples. Oranges. Grapes. Papaya. Mango. Pomegranate. Kiwi. Grapefruit. Cherries. Meats and Other Protein Sources Seafood. Lean meats, such as chicken and [place] or lean cuts of pork and beef. Tofu. Eggs. Nuts. Beans. Dairy Low-fat or  fat-free dairy products, such as yogurt, cottage cheese, and cheese. Beverages Water. Tea. Coffee. Sugar-free or diet soda. Seltzer water. Milk. Milk alternatives, such as soy or almond milk. Condiments Mustard. Relish. Low-fat, low-sugar ketchup. Low-fat, low-sugar barbecue sauce. Low-fat or fat-free mayonnaise. Sweets and Desserts Sugar-free or low-fat pudding. Sugar-free or low-fat ice cream and other frozen treats. Fats and Oils Avocado. Walnuts. Olive oil. The items listed above may not be a complete list of recommended foods or beverages. Contact your dietitian for more options. What foods are not recommended? Grains Refined white flour and flour products, such as bread, pasta, snack foods, and cereals. Beverages Sweetened drinks, such as sweet iced tea and soda. Sweets and Desserts Baked goods, such as cake, cupcakes, pastries, cookies, and cheesecake. The items listed above may not be a complete list of foods and beverages to avoid. Contact your dietitian for more information. This information is not intended to replace advice given to you by your health care provider. Make sure you discuss any questions you have with your health care provider. Document Released: 08/21/2014 Document Revised: 09/12/2015 Document Reviewed: 05/02/2014 Elsevier Interactive Patient Education  2017 [place] With Depression Everyone experiences occasional disappointment, sadness, and loss in their lives. When you are feeling down, blue, or sad for at least 2 weeks in a row, it may mean that you have depression. Depression can affect your thoughts and feelings, relationships, daily activities, and physical health. It is caused by changes in the way your brain functions. If you receive a diagnosis of depression, your health care provider will tell you which type of depression you have and what treatment options  are available to you. If you are living with depression, there are ways to help  you recover from it and also ways to prevent it from coming back. How to cope with lifestyle changes Coping with stress Stress is your body's reaction to life changes and events, both good and bad. Stressful situations may include:  Getting married.  The death of a spouse.  Losing a job.  Retiring.  Having a baby.  Stress can last just a few hours or it can be ongoing. Stress can play a major role in depression, so it is important to learn both how to cope with stress and how to think about it differently. Talk with your health care provider or a counselor if you would like to learn more about stress reduction. He or she may suggest some stress reduction techniques, such as:  Music therapy. This can include creating music or listening to music. Choose music that you enjoy and that inspires you.  Mindfulness-based meditation. This kind of meditation can be done while sitting or walking. It involves being aware of your normal breaths, rather than trying to control your breathing.  Centering prayer. This is a kind of meditation that involves focusing on a spiritual word or phrase. Choose a word, phrase, or sacred image that is meaningful to you and that brings you peace.  Deep breathing. To do this, expand your stomach and inhale slowly through your nose. Hold your breath for 3-5 seconds, then exhale slowly, allowing your stomach muscles to relax.  Muscle relaxation. This involves intentionally tensing muscles then relaxing them.  Choose a stress reduction technique that fits your lifestyle and personality. Stress reduction techniques take time and practice to develop. Set aside 5-15 minutes a day to do them. Therapists can offer training in these techniques. The training may be covered by some insurance plans. Other things you can do to manage stress include:  Keeping a stress diary. This can help you learn what triggers your stress and ways to control your response.  Understanding what  your limits are and saying no to requests or events that lead to a schedule that is too full.  Thinking about how you respond to certain situations. You may not be able to control everything, but you can control how you react.  Adding humor to your life by watching funny films or TV shows.  Making time for activities that help you relax and not feeling guilty about spending your time this way.  Medicines Your health care provider may suggest certain medicines if he or she feels that they will help improve your condition. Avoid using alcohol and other substances that may prevent your medicines from working properly (may interact). It is also important to:  Talk with your pharmacist or health care provider about all the medicines that you take, their possible side effects, and what medicines are safe to take together.  Make it your goal to take part in all treatment decisions (shared decision-making). This includes giving input on the side effects of medicines. It is best if shared decision-making with your health care provider is part of your total treatment plan.  If your health care provider prescribes a medicine, you may not notice the full benefits of it for 4-8 weeks. Most people who are treated for depression need to be on medicine for at least 6-12 months after they feel better. If you are taking medicines as part of your treatment, do not stop taking medicines without first talking to your  health care provider. You may need to have the medicine slowly decreased (tapered) over time to decrease the risk of harmful side effects. Relationships Your health care provider may suggest family therapy along with individual therapy and drug therapy. While there may not be family problems that are causing you to feel depressed, it is still important to make sure your family learns as much as they can about your mental health. Having your family's support can help make your treatment successful. How to  recognize changes in your condition Everyone has a different response to treatment for depression. Recovery from major depression happens when you have not had signs of major depression for two months. This may mean that you will start to:  Have more interest in doing activities.  Feel less hopeless than you did 2 months ago.  Have more energy.  Overeat less often, or have better or improving appetite.  Have better concentration.  Your health care provider will work with you to decide the next steps in your recovery. It is also important to recognize when your condition is getting worse. Watch for these signs:  Having fatigue or low energy.  Eating too much or too little.  Sleeping too much or too little.  Feeling restless, agitated, or hopeless.  Having trouble concentrating or making decisions.  Having unexplained physical complaints.  Feeling irritable, angry, or aggressive.  Get help as soon as you or your family members notice these symptoms coming back. How to get support and help from others How to talk with friends and family members about your condition Talking to friends and family members about your condition can provide you with one way to get support and guidance. Reach out to trusted friends or family members, explain your symptoms to them, and let them know that you are working with a health care provider to treat your depression. Financial resources Not all insurance plans cover mental health care, so it is important to check with your insurance carrier. If paying for co-pays or counseling services is a problem, search for a local or county mental health care center. They may be able to offer public mental health care services at low or no cost when you are not able to see a private health care provider. If you are taking medicine for depression, you may be able to get the generic form, which may be less expensive. Some makers of prescription medicines also offer  help to patients who cannot afford the medicines they need. Follow these instructions at home:  Get the right amount and quality of sleep.  Cut down on using caffeine, tobacco, alcohol, and other potentially harmful substances.  Try to exercise, such as walking or lifting small weights.  Take over-the-counter and prescription medicines only as told by your health care provider.  Eat a healthy diet that includes plenty of vegetables, fruits, whole grains, low-fat dairy products, and lean protein. Do not eat a lot of foods that are high in solid fats, added sugars, or salt.  Keep all follow-up visits as told by your health care provider. This is important. Contact a health care provider if:  You stop taking your antidepressant medicines, and you have any of these symptoms: ? Nausea. ? Headache. ? Feeling lightheaded. ? Chills and body aches. ? Not being able to sleep (insomnia).  You or your friends and family think your depression is getting worse. Get help right away if:  You have thoughts of hurting yourself or others. If you ever  feel like you may hurt yourself or others, or have thoughts about taking your own life, get help right away. You can go to your nearest emergency department or call:  Your local emergency services (911 in the [place]  A suicide crisis helpline, such as the [facility] at 323-646-3255. This is open 24-hours a day.  Summary  If you are living with depression, there are ways to help you recover from it and also ways to prevent it from coming back.  Work with your health care team to create a management plan that includes counseling, stress management techniques, and healthy lifestyle habits. This information is not intended to replace advice given to you by your health care provider. Make sure you discuss any questions you have with your health care provider. Document Released: 03/09/2016 Document Revised: 03/09/2016 Document  Reviewed: 03/09/2016 Elsevier Interactive Patient Education  [employer].

## 2017-11-09 NOTE — Progress Notes (Signed)
Subjective:  Patient ID: Catherine [name], female    DOB: 02-14-1971  Age: 47 y.o. MRN: [id number]  CC: depression with anxiety f/u  HPI Catherine Wos Lemonsis a 47 y.o.femalewith a medical history of anxiety, depression, HTN, noncompliance, Prediabetes, and bronchitis presentsto f/u on depression with anxiety. Seen here 10 weeks ago and prescribed Lexapro 20 mg and clonazepam 0.5 mg qhs. Says she ran out of Escitalopram but did not pay attention and did not notice she had refills. Has not taken escitalopram for greater than thirty days. Reports mild irritability and fatigue. PHQ9 8 and GAD7 6 at last visit. PHQ9 9 and GAD7 7 today. Does not endorse suicidal ideation or intent. Does not endorse any other symptoms or complaints.     Outpatient Medications Prior to Visit  Medication Sig Dispense Refill  . clonazePAM (KLONOPIN) 0.5 MG tablet Take 1 tablet (0.5 mg total) by mouth at bedtime. 30 tablet 0  . escitalopram (LEXAPRO) 20 MG tablet Take 1 tablet (20 mg total) by mouth daily. 30 tablet 5  . hydrochlorothiazide (HYDRODIURIL) 25 MG tablet TAKE 1 TABLET BY MOUTH DAILY. TAKE ON TABLET IN THE MORNING. 90 tablet 1  . losartan (COZAAR) 50 MG tablet Take 2 tablets (100 mg total) by mouth daily. 180 tablet 1  . albuterol (PROVENTIL HFA;VENTOLIN HFA) 108 (90 Base) MCG/ACT inhaler Inhale 1-2 puffs into the lungs every 6 (six) hours as needed for wheezing or shortness of breath. (Patient not taking: Reported on 11/09/2017) 1 Inhaler 0   No facility-administered medications prior to visit.      ROS Review of Systems  Constitutional: Negative for chills, fever and malaise/fatigue.  Eyes: Negative for blurred vision.  Respiratory: Negative for shortness of breath.   Cardiovascular: Negative for chest pain and palpitations.  Gastrointestinal: Negative for abdominal pain and nausea.  Genitourinary: Negative for dysuria and hematuria.  Musculoskeletal: Negative for joint pain and myalgias.  Skin:  Negative for rash.  Neurological: Negative for tingling and headaches.  Psychiatric/Behavioral: Negative for depression. The patient is not nervous/anxious.     Objective:  BP 136/87 (BP Location: Left Arm, Patient Position: Sitting, Cuff Size: Large)   Pulse 79   Temp 97.7 F (36.5 C) (Oral)   Ht 5\' 7"  (1.702 m)   Wt 295 lb 6.4 oz (134 kg)   SpO2 100%   BMI 46.27 kg/m   BP/Weight 11/09/2017 08/24/2017 65/78/4696  Systolic BP 295 284 132  Diastolic BP 87 77 94  Wt. (Lbs) 295.4 292 293  BMI 46.27 45.73 45.89      Physical Exam  Constitutional: She is oriented to person, place, and time.  Well developed, obese, NAD, polite  HENT:  Head: Normocephalic and atraumatic.  Eyes: No scleral icterus.  Neck: Normal range of motion. Neck supple. No thyromegaly present.  Cardiovascular: Normal rate, regular rhythm and normal heart sounds.  Pulmonary/Chest: Effort normal and breath sounds normal.  Musculoskeletal: She exhibits no edema.  Neurological: She is alert and oriented to person, place, and time.  Skin: Skin is warm and dry. No rash noted. No erythema. No pallor.  Psychiatric: She has a normal mood and affect. Her behavior is normal. Thought content normal.  Full affect, thoughts linear.  Vitals reviewed.    Assessment & Plan:   1. Depression with anxiety - Refill escitalopram (LEXAPRO) 20 MG tablet; Take 1 tablet (20 mg total) by mouth daily.  Dispense: 30 tablet; Refill: 5 - Begin hydrOXYzine (ATARAX/VISTARIL) 10 MG tablet; Take 1 tablet (  10 mg total) by mouth 3 (three) times daily as needed.  Dispense: 60 tablet; Refill: 2  2. Prediabetes - HgB A1c 6.3% - Lipid panel - Comprehensive metabolic panel - Referral to Nutrition and Diabetes Services - Begin metFORMIN (GLUCOPHAGE-XR) 500 MG 24 hr tablet; Take 1 tablet (500 mg total) by mouth daily with breakfast.  Dispense: 30 tablet; Refill: 11  3. Need for Tdap vaccination - Tdap vaccine greater than or equal to 7yo  IM  4. Need for prophylactic vaccination against Streptococcus pneumoniae (pneumococcus) - Pneumococcal polysaccharide vaccine 23-valent greater than or equal to 2yo subcutaneous/IM  5. Screening for HIV (human immunodeficiency virus) - HIV antibody  6. Medication refill - albuterol (PROVENTIL HFA;VENTOLIN HFA) 108 (90 Base) MCG/ACT inhaler; Inhale 1-2 puffs into the lungs every 6 (six) hours as needed for wheezing or shortness of breath.  Dispense: 1 Inhaler; Refill: 3 - losartan (COZAAR) 50 MG tablet; Take 2 tablets (100 mg total) by mouth daily.  Dispense: 60 tablet; Refill: 5 - hydrochlorothiazide (HYDRODIURIL) 25 MG tablet; TAKE 1 TABLET BY MOUTH DAILY. TAKE ON TABLET IN THE MORNING.  Dispense: 30 tablet; Refill: 5   Meds ordered this encounter  Medications  . albuterol (PROVENTIL HFA;VENTOLIN HFA) 108 (90 Base) MCG/ACT inhaler    Sig: Inhale 1-2 puffs into the lungs every 6 (six) hours as needed for wheezing or shortness of breath.    Dispense:  1 Inhaler    Refill:  3    Order Specific Question:   Supervising Provider    Answer:   Charlott Rakes [[id number]]  . escitalopram (LEXAPRO) 20 MG tablet    Sig: Take 1 tablet (20 mg total) by mouth daily.    Dispense:  30 tablet    Refill:  5    Order Specific Question:   Supervising Provider    Answer:   Charlott Rakes [[id number]]  . hydrOXYzine (ATARAX/VISTARIL) 10 MG tablet    Sig: Take 1 tablet (10 mg total) by mouth 3 (three) times daily as needed.    Dispense:  60 tablet    Refill:  2    Order Specific Question:   Supervising Provider    Answer:   Charlott Rakes [[id number]]  . losartan (COZAAR) 50 MG tablet    Sig: Take 2 tablets (100 mg total) by mouth daily.    Dispense:  60 tablet    Refill:  5    Order Specific Question:   Supervising Provider    Answer:   Charlott Rakes [[id number]]  . hydrochlorothiazide (HYDRODIURIL) 25 MG tablet    Sig: TAKE 1 TABLET BY MOUTH DAILY. TAKE ON TABLET IN THE MORNING.    Dispense:  30 tablet     Refill:  5    Order Specific Question:   Supervising Provider    Answer:   Charlott Rakes [[id number]]  . metFORMIN (GLUCOPHAGE-XR) 500 MG 24 hr tablet    Sig: Take 1 tablet (500 mg total) by mouth daily with breakfast.    Dispense:  30 tablet    Refill:  11    Order Specific Question:   Supervising Provider    Answer:   Charlott Rakes [[id number]]    Follow-up: Return in about 8 weeks (around 01/04/2018) for depression with anxiety.   Clent Demark PA

## 2017-11-10 ENCOUNTER — Other Ambulatory Visit (INDEPENDENT_AMBULATORY_CARE_PROVIDER_SITE_OTHER): Payer: Self-pay | Admitting: Physician Assistant

## 2017-11-10 ENCOUNTER — Telehealth (INDEPENDENT_AMBULATORY_CARE_PROVIDER_SITE_OTHER): Payer: Self-pay

## 2017-11-10 DIAGNOSIS — E7841 Elevated Lipoprotein(a): Secondary | ICD-10-CM

## 2017-11-10 LAB — COMPREHENSIVE METABOLIC PANEL
ALBUMIN: 4.3 g/dL (ref 3.5–5.5)
ALT: 13 IU/L (ref 0–32)
AST: 14 IU/L (ref 0–40)
Albumin/Globulin Ratio: 2 (ref 1.2–2.2)
Alkaline Phosphatase: 60 IU/L (ref 39–117)
BUN / CREAT RATIO: 18 (ref 9–23)
BUN: 15 mg/dL (ref 6–24)
Bilirubin Total: 0.5 mg/dL (ref 0.0–1.2)
CALCIUM: 9.6 mg/dL (ref 8.7–10.2)
CO2: 27 mmol/L (ref 20–29)
CREATININE: 0.82 mg/dL (ref 0.57–1.00)
Chloride: 100 mmol/L (ref 96–106)
GFR calc Af Amer: 99 mL/min/{1.73_m2} (ref >59)
GFR calc non Af Amer: 85 mL/min/{1.73_m2} (ref >59)
GLUCOSE: 108 mg/dL — AB (ref 65–99)
Globulin, Total: 2.2 g/dL (ref 1.5–4.5)
Potassium: 4 mmol/L (ref 3.5–5.2)
Sodium: 141 mmol/L (ref 134–144)
Total Protein: 6.5 g/dL (ref 6.0–8.5)

## 2017-11-10 LAB — HIV ANTIBODY (ROUTINE TESTING W REFLEX): HIV SCREEN 4TH GENERATION: NONREACTIVE

## 2017-11-10 LAB — LIPID PANEL
Chol/HDL Ratio: 3.5 ratio (ref 0.0–4.4)
Cholesterol, Total: 163 mg/dL (ref 100–199)
HDL: 46 mg/dL (ref >39)
LDL Calculated: 101 mg/dL — ABNORMAL HIGH (ref 0–99)
Triglycerides: 79 mg/dL (ref 0–149)
VLDL Cholesterol Cal: 16 mg/dL (ref 5–40)

## 2017-11-10 MED ORDER — PRAVASTATIN SODIUM 40 MG PO TABS: 40.0000 mg | ORAL_TABLET | Freq: Every day | ORAL | 11 refills | Status: DC

## 2017-11-10 NOTE — Telephone Encounter (Signed)
-----   Message from Clent Demark, PA-C sent at 11/10/2017  8:36 AM EDT ----- LDL is slightly elevated. Eat less fatty/fried foods. I have sent pravastatin to [facility] to help lower LDL.

## 2017-11-10 NOTE — Telephone Encounter (Signed)
Patient aware of slightly elevated LDL, advised to eat less fatty/fried foods. Pravastatin sent to [facility] to help lower LDL. HIV negative. Nat Christen, CMA

## 2017-11-18 MED FILL — METFORMIN HCL ER 500 MG TAB: 500 | 30 days supply | Qty: 30 | Fill #0

## 2017-11-18 MED FILL — !VENTOLIN HFA INHALER: 108 (90 BAS | 25 days supply | Qty: 1 | Fill #0

## 2017-11-18 MED FILL — HYDROCHLOROTHIAZIDE 25 MG T: 25 | 30 days supply | Qty: 30 | Fill #2

## 2017-11-18 MED FILL — ESCITALOPRAM 20 MG TABLET: 20 | 30 days supply | Qty: 30 | Fill #0

## 2017-11-18 MED FILL — PRAVASTATIN NA 40 MG TAB: 40 | 30 days supply | Qty: 30 | Fill #0

## 2017-11-18 MED FILL — LOSARTAN POTASSIUM 50 MG TA: 50 | 30 days supply | Qty: 60 | Fill #2

## 2017-11-18 MED FILL — hydrOXYzine HCL 10 MG TABS: 10 | 20 days supply | Qty: 60 | Fill #0

## 2017-12-13 ENCOUNTER — Encounter: Payer: Self-pay | Attending: Physician Assistant | Admitting: Registered"

## 2017-12-13 ENCOUNTER — Encounter: Payer: Self-pay | Admitting: Registered"

## 2017-12-13 DIAGNOSIS — Z713 Dietary counseling and surveillance: Secondary | ICD-10-CM | POA: Insufficient documentation

## 2017-12-13 DIAGNOSIS — R7303 Prediabetes: Secondary | ICD-10-CM | POA: Insufficient documentation

## 2017-12-13 NOTE — Patient Instructions (Addendum)
Instructions/Goals:  Make sure to get in three meals per day. Try to have balanced meals like the My Plate example (see handout). Try to include more vegetables, fruits, and whole grains at meals.   Starting Nutrition Goal: Get in 3 meals: breakfast, lunch, and dinner.  Have easy go to breakfast items available to eat for breakfast in the morning: want to have protein and carbohydrates: Discussed Ideas: oatmeal and boiled egg(s); low fat yogurt ([place] will provide more protein) and added fruit; peanut butter and apple slices with some yogurt on the side; etc.   Make physical activity a part of your week. Try to include at least 30 minutes of physical activity 5 days each week or at least 150 minutes per week. Regular physical activity promotes overall health-including helping to reduce risk for heart disease and diabetes, promoting mental health, and helping [place] sleep better.    Starting Physical Activity Goal: Include at least 30 minutes of physical activity at least one day per week.

## 2017-12-13 NOTE — Progress Notes (Signed)
Medical Nutrition Therapy:  Appt start time: 0350 end time:  0938.   Assessment:  Primary concerns today: Pt referred due to prediabetes. Pt reports that she has been feeling stressed/depressed lately. Reports that the medication she takes for depression has helped some. She reports that she believes her living arrangement and job are the main reason she has been depressed and she feels she will feel better once these things change. Pt reports that she plans to move next month. Pt is currently living with her daughter and grandchildren which she reports has been very stressful. Pt currently works as an Retail buyer. Pt works Mon, Tues, Wed, Friday-830 am to 730 pm and has 1 hour lunch break. Pt is interviewing for a new job later this week. Pt reports she was taking Metformin for about 2 weeks but it was causing stomach upset and so she stopped taking it. Reports she was taking it will a meal and it would still cause her to need to run to the bathroom. Pt reports that in order for her to make changes it is important that she set small goals. Pt reports that her main goals for this visit include losing weight so she can improve her cholesterol and blood sugar and prevent the development of diabetes.     Noted Lab Values: 11/09/17 HgbA1c: 6.3 LDL: 101  Preferred Learning Style:   No preference indicated   Learning Readiness:   Ready  MEDICATIONS: See list.    DIETARY INTAKE:  Usual eating pattern includes 2 meals and typically no snacks per day. Pt reports she typically skips breakfast and only eats lunch and dinner. Pt had been eating breakfast when she was taking Metformin but reports that she does not usually eat breakfast due to lack of time in the morning. Meals eaten at home are usually eaten in the living room.   Everyday foods vary.  Avoided foods include brussels sprouts, cottage cheese, beets.    24-hr recall:  B ( AM): [employer] cajun chicken biscuit, bo rounds, orange  juice,   Snk ( AM): None reported.  L ( PM): hamburger, onions, mushrooms, cabbage with onions, bell peppers, white rice, 2 yeast rolls, cran-grape juice  Snk ( PM): None reported.  D ( PM): leftovers from lunch but did not eat any yeast rolls, cran-apple juice Snk ( PM): None reported.  Beverages: around 2 bottles of water (32 oz water), juice   Usual physical activity: None reported.   Progress Towards Goal(s):  In progress.   Nutritional Diagnosis:  NB-2.1 Physical inactivity As related to sedentary lifestyle .  As evidenced by pt's reported activity recall and habits . NI-5.11.1 Predicted suboptimal nutrient intake As related to skipping meals .  As evidenced by pt's reported dietary recall and habits .    Intervention:  Nutrition counseling provided. Dietitian discussed pt's hgbA1c and provided education regarding the relationship between blood sugar/insulin resistance and dietary intake. Dietitian provided education regarding the importance of eating consistent meals/not skipping meals and balanced nutrition. Provided education regarding the benefits of physical activity on blood sugar management. Dietitian worked with pt to set starting goals. Pt reports that she needs to set small goals in order to obtain them. Dietitian encouraged pt to discuss her concerns about Metformin with her physician. Pt appeared agreeable to information/goals discussed.   Instructions/Goals:  Make sure to get in three meals per day. Try to have balanced meals like the My Plate example (see handout). Try to include more vegetables,  fruits, and whole grains at meals.   Starting Nutrition Goal: Get in 3 meals: breakfast, lunch, and dinner.  Have easy go to breakfast items available to eat for breakfast in the morning: want to have protein and carbohydrates: Discussed Ideas: oatmeal and boiled egg(s); low fat yogurt ([place] will provide more protein) and added fruit; peanut butter and apple slices with some  yogurt on the side; etc.   Make physical activity a part of your week. Try to include at least 30 minutes of physical activity 5 days each week or at least 150 minutes per week. Regular physical activity promotes overall health-including helping to reduce risk for heart disease and diabetes, promoting mental health, and helping [place] sleep better.    Starting Physical Activity Goal: Include at least 30 minutes of physical activity at least one day per week.  Teaching Method Utilized:  Visual Auditory  Handouts given during visit include:  Balanced plate with food list.   Barriers to learning/adherence to lifestyle change: Busy schedule. Pt reports she will be starting a new job next month which will include a new schedule.   Demonstrated degree of understanding via:  Teach Back   Monitoring/Evaluation:  Dietary intake, exercise, and body weight prn. Pt plans to call back to schedule next appointment after she receives her schedule for her new job.

## 2017-12-16 MED FILL — LOSARTAN POTASSIUM 50 MG TA: 50 | 30 days supply | Qty: 60 | Fill #3

## 2017-12-16 MED FILL — HYDROCHLOROTHIAZIDE 25 MG T: 25 | 30 days supply | Qty: 30 | Fill #3

## 2018-01-04 ENCOUNTER — Encounter (INDEPENDENT_AMBULATORY_CARE_PROVIDER_SITE_OTHER): Payer: Self-pay | Admitting: Physician Assistant

## 2018-01-04 ENCOUNTER — Ambulatory Visit (INDEPENDENT_AMBULATORY_CARE_PROVIDER_SITE_OTHER): Payer: Self-pay | Admitting: Physician Assistant

## 2018-01-04 ENCOUNTER — Other Ambulatory Visit: Payer: Self-pay

## 2018-01-04 VITALS — BP 129/87 | HR 71 | Temp 97.9°F | Ht 67.0 in | Wt 291.8 lb

## 2018-01-04 DIAGNOSIS — Z1272 Encounter for screening for malignant neoplasm of vagina: Secondary | ICD-10-CM

## 2018-01-04 DIAGNOSIS — F418 Other specified anxiety disorders: Secondary | ICD-10-CM

## 2018-01-04 DIAGNOSIS — J320 Chronic maxillary sinusitis: Secondary | ICD-10-CM

## 2018-01-04 DIAGNOSIS — Z1231 Encounter for screening mammogram for malignant neoplasm of breast: Secondary | ICD-10-CM

## 2018-01-04 DIAGNOSIS — R7303 Prediabetes: Secondary | ICD-10-CM

## 2018-01-04 DIAGNOSIS — Z1239 Encounter for other screening for malignant neoplasm of breast: Secondary | ICD-10-CM

## 2018-01-04 MED ORDER — GUAIFENESIN ER 1200 MG PO TB12: 1.0000 | ORAL_TABLET | Freq: Two times a day (BID) | ORAL | 0 refills | Status: AC

## 2018-01-04 MED ORDER — FLUTICASONE PROPIONATE 50 MCG/ACT NA SUSP: 2.0000 | Freq: Every day | NASAL | 2 refills | Status: DC

## 2018-01-04 MED ORDER — NAPROXEN 500 MG PO TABS: 500.0000 mg | ORAL_TABLET | Freq: Two times a day (BID) | ORAL | 0 refills | Status: DC

## 2018-01-04 MED ORDER — GLIMEPIRIDE 2 MG PO TABS: 2.0000 mg | ORAL_TABLET | Freq: Every day | ORAL | 3 refills | Status: DC

## 2018-01-04 MED FILL — NAPROXEN 500 MG TABLET: 500 | 15 days supply | Qty: 30 | Fill #0

## 2018-01-04 MED FILL — GLIMEPIRIDE 2 MG TABS: 2 | 30 days supply | Qty: 30 | Fill #0

## 2018-01-04 MED FILL — FLUTICASONE PROP 50 MCG SPR: 50 | 30 days supply | Qty: 16 | Fill #0

## 2018-01-04 NOTE — Progress Notes (Signed)
Subjective:  Patient ID: Catherine [name], female    DOB: Sep 04, 1970  Age: 47 y.o. MRN: [id number]  CC: f/u depression with anxiety. Eye pain  HPI Catherine [name] a 47 y.o.femalewith a medical history of anxiety, depression, HTN,noncompliance,Prediabetes, and bronchitis presentsto f/u on depression with anxiety. PHQ9 9 and GAD7 7 today. Continues to take Escitalopram as directed. States she has found a higher paying job at [employer] with less responsibilities. She is hopeful and looking forward to a better life with improved mental health.     Complains of one day history of left eye pressure, left maxillary sinus pressure, and nasal congestion. Pain worse when leaning forward. Was exposed to her sick children that were sick with a "virus" one week ago. Does not endorse f/c/n/v, body aches, rash, swelling, CP, SOB, HA, abdominal pain, rash, or GI/GU sxs.      Outpatient Medications Prior to Visit  Medication Sig Dispense Refill  . albuterol (PROVENTIL HFA;VENTOLIN HFA) 108 (90 Base) MCG/ACT inhaler Inhale 1-2 puffs into the lungs every 6 (six) hours as needed for wheezing or shortness of breath. 1 Inhaler 3  . escitalopram (LEXAPRO) 20 MG tablet Take 1 tablet (20 mg total) by mouth daily. 30 tablet 5  . hydrochlorothiazide (HYDRODIURIL) 25 MG tablet TAKE 1 TABLET BY MOUTH DAILY. TAKE ON TABLET IN THE MORNING. 30 tablet 5  . hydrOXYzine (ATARAX/VISTARIL) 10 MG tablet Take 1 tablet (10 mg total) by mouth 3 (three) times daily as needed. 60 tablet 2  . losartan (COZAAR) 50 MG tablet Take 2 tablets (100 mg total) by mouth daily. 60 tablet 5  . pravastatin (PRAVACHOL) 40 MG tablet Take 1 tablet (40 mg total) by mouth daily. 30 tablet 11  . metFORMIN (GLUCOPHAGE-XR) 500 MG 24 hr tablet Take 1 tablet (500 mg total) by mouth daily with breakfast. (Patient not taking: Reported on 01/04/2018) 30 tablet 11   No facility-administered medications prior to visit.      ROS Review of Systems   Constitutional: Negative for chills, fever and malaise/fatigue.  HENT: Positive for sinus pain. Negative for ear pain and sore throat.   Eyes: Negative for blurred vision, discharge and redness.  Respiratory: Negative for cough and shortness of breath.   Cardiovascular: Negative for chest pain and palpitations.  Gastrointestinal: Negative for abdominal pain and nausea.  Genitourinary: Negative for dysuria and hematuria.  Musculoskeletal: Negative for joint pain and myalgias.  Skin: Negative for rash.  Neurological: Negative for tingling and headaches.  Psychiatric/Behavioral: Negative for depression. The patient is not nervous/anxious.     Objective:  BP 129/87 (BP Location: Left Arm, Patient Position: Sitting, Cuff Size: Large)   Pulse 71   Temp 97.9 F (36.6 C) (Oral)   Ht 5\' 7"  (1.702 m)   Wt 291 lb 12.8 oz (132.4 kg)   SpO2 99%   BMI 45.70 kg/m   BP/Weight 01/04/2018 12/13/2017 4/48/1856  Systolic BP 314 - 970  Diastolic BP 87 - 87  Wt. (Lbs) 291.8 292.1 295.4  BMI 45.7 45.75 46.27      Physical Exam  Constitutional: She is oriented to person, place, and time.  Well developed, well nourished, NAD, polite  HENT:  Head: Normocephalic and atraumatic.  Turbinates patent and nonerythematous. TMs nonerythematous with no bulging/retraction and no air/fluid level. No oropharygeal exudates or erythema. Mild TTP along the left maxillary sinus.   Eyes: No scleral icterus.  Neck: Normal range of motion. Neck supple. No thyromegaly present.  Cardiovascular: Normal rate,  regular rhythm and normal heart sounds.  Pulmonary/Chest: Effort normal and breath sounds normal. No respiratory distress.  Musculoskeletal: She exhibits no edema.  Neurological: She is alert and oriented to person, place, and time.  Skin: Skin is warm and dry. No rash noted. No erythema. No pallor.  Psychiatric: She has a normal mood and affect. Her behavior is normal. Thought content normal.  Vitals  reviewed.    Assessment & Plan:   1. Depression with anxiety - Refill Escitalopram 20 mg. Plan to discontinue once patient is well established in her new job at [employer] and stress/anxiety/depression have improved significantly.   2. Chronic maxillary sinusitis - Begin Guaifenesin and Fluticasone nasal spray.  3. Prediabetes - Stop Metformin 500 mg XR due to intolerance - Begin Glimepiride 2mg   4. Screening for breast cancer - I have messaged Ms. Rolena Infante to enroll pt into the [facility] program for free screening.  5. Screening for vaginal cancer - S/p hysterectomy. Reportedly had a couple of pap smears since hysterectomy with normal results. Will discontinue pap smears should she have another negative exam.  - I have messaged Ms. Rolena Infante to enroll pt into the [facility] program for free screening.    Meds ordered this encounter  Medications  . Guaifenesin (MUCINEX MAXIMUM STRENGTH) 1200 MG TB12    Sig: Take 1 tablet (1,200 mg total) by mouth 2 (two) times daily for 5 days.    Dispense:  10 tablet    Refill:  0    Order Specific Question:   Supervising Provider    Answer:   Charlott Rakes [[id number]]  . glimepiride (AMARYL) 2 MG tablet    Sig: Take 1 tablet (2 mg total) by mouth daily before breakfast.    Dispense:  30 tablet    Refill:  3    Order Specific Question:   Supervising Provider    Answer:   Charlott Rakes [[id number]]  . naproxen (NAPROSYN) 500 MG tablet    Sig: Take 1 tablet (500 mg total) by mouth 2 (two) times daily with a meal.    Dispense:  30 tablet    Refill:  0    Order Specific Question:   Supervising Provider    Answer:   Charlott Rakes [[id number]]  . fluticasone (FLONASE) 50 MCG/ACT nasal spray    Sig: Place 2 sprays into both nostrils daily.    Dispense:  16 g    Refill:  2    Order Specific Question:   Supervising Provider    Answer:   Charlott Rakes [[id number]]    Follow-up: Return in about 3 months (around 04/05/2018), or if symptoms worsen or fail to  improve, for Prediabetes.   Catherine Demark PA

## 2018-01-04 NOTE — Patient Instructions (Signed)

## 2018-01-04 NOTE — Progress Notes (Signed)
Pt complains of headache behind left eye and when she leans her head forward she feels pressure- onset yesterday  Pt not taking metformin caused her stomach to feel raw, burn and caused diarrhea

## 2018-01-25 MED FILL — HYDROCHLOROTHIAZIDE 25 MG T: 25 | 30 days supply | Qty: 30 | Fill #4

## 2018-01-25 MED FILL — LOSARTAN POTASSIUM 50 MG TA: 50 | 30 days supply | Qty: 60 | Fill #4

## 2018-01-27 MED FILL — ESCITALOPRAM 20 MG TABLET: 20 | 30 days supply | Qty: 30 | Fill #1

## 2018-02-11 ENCOUNTER — Telehealth (HOSPITAL_COMMUNITY): Payer: Self-pay

## 2018-02-11 NOTE — Telephone Encounter (Signed)
Left a message to call [facility] °

## 2018-02-18 ENCOUNTER — Telehealth (HOSPITAL_COMMUNITY): Payer: Self-pay

## 2018-02-18 NOTE — Telephone Encounter (Signed)
Called she said she had mammo done through her job with [employer]

## 2018-03-02 MED FILL — LOSARTAN POTASSIUM 50 MG TA: 50 | 30 days supply | Qty: 60 | Fill #5

## 2018-03-02 MED FILL — HYDROCHLOROTHIAZIDE 25 MG T: 25 | 30 days supply | Qty: 30 | Fill #5

## 2018-03-25 ENCOUNTER — Ambulatory Visit (INDEPENDENT_AMBULATORY_CARE_PROVIDER_SITE_OTHER): Payer: Self-pay | Admitting: Physician Assistant

## 2018-04-05 ENCOUNTER — Ambulatory Visit (INDEPENDENT_AMBULATORY_CARE_PROVIDER_SITE_OTHER): Payer: Self-pay | Admitting: Physician Assistant

## 2018-04-14 ENCOUNTER — Other Ambulatory Visit (INDEPENDENT_AMBULATORY_CARE_PROVIDER_SITE_OTHER): Payer: Self-pay | Admitting: Physician Assistant

## 2018-04-14 ENCOUNTER — Telehealth (INDEPENDENT_AMBULATORY_CARE_PROVIDER_SITE_OTHER): Payer: Self-pay

## 2018-04-14 DIAGNOSIS — Z760 Encounter for issue of repeat prescription: Secondary | ICD-10-CM

## 2018-04-14 MED ORDER — PRAVASTATIN SODIUM 40 MG PO TABS: 40.0000 mg | ORAL_TABLET | Freq: Every day | ORAL | 11 refills | Status: DC

## 2018-04-14 MED ORDER — HYDROXYZINE HCL 10 MG PO TABS: 10.0000 mg | ORAL_TABLET | Freq: Three times a day (TID) | ORAL | 2 refills | Status: DC | PRN

## 2018-04-14 MED ORDER — FLUTICASONE PROPIONATE 50 MCG/ACT NA SUSP: 2.0000 | Freq: Every day | NASAL | 2 refills | Status: DC

## 2018-04-14 MED ORDER — GLIMEPIRIDE 2 MG PO TABS: 2.0000 mg | ORAL_TABLET | Freq: Every day | ORAL | 3 refills | Status: DC

## 2018-04-14 MED ORDER — ALBUTEROL SULFATE HFA 108 (90 BASE) MCG/ACT IN AERS: 1.0000 | INHALATION_SPRAY | Freq: Four times a day (QID) | RESPIRATORY_TRACT | 3 refills | Status: DC | PRN

## 2018-04-14 MED ORDER — NAPROXEN 500 MG PO TABS: 500.0000 mg | ORAL_TABLET | Freq: Two times a day (BID) | ORAL | 0 refills | Status: DC

## 2018-04-14 MED ORDER — ESCITALOPRAM OXALATE 20 MG PO TABS: 20.0000 mg | ORAL_TABLET | Freq: Every day | ORAL | 5 refills | Status: DC

## 2018-04-14 MED ORDER — LOSARTAN POTASSIUM 50 MG PO TABS: 100.0000 mg | ORAL_TABLET | Freq: Every day | ORAL | 5 refills | Status: DC

## 2018-04-14 MED ORDER — HYDROCHLOROTHIAZIDE 25 MG PO TABS: ORAL_TABLET | ORAL | 5 refills | Status: DC

## 2018-04-14 NOTE — Telephone Encounter (Signed)
Left voicemail notifying patient  that all medications have been sent to [facility].  Nat Christen, CMA

## 2018-04-14 NOTE — Telephone Encounter (Signed)
[facility]

## 2018-04-14 NOTE — Telephone Encounter (Signed)
Patient called requesting all medications to be moved to [facility] [address] being that she now has insurance. Nat Christen, CMA

## 2018-05-09 ENCOUNTER — Other Ambulatory Visit: Payer: Self-pay

## 2018-05-09 ENCOUNTER — Ambulatory Visit (INDEPENDENT_AMBULATORY_CARE_PROVIDER_SITE_OTHER): Payer: 59 | Admitting: Internal Medicine

## 2018-05-09 ENCOUNTER — Encounter (INDEPENDENT_AMBULATORY_CARE_PROVIDER_SITE_OTHER): Payer: Self-pay | Admitting: Internal Medicine

## 2018-05-09 VITALS — BP 138/96 | HR 64 | Temp 97.8°F | Resp 16 | Wt 302.0 lb

## 2018-05-09 DIAGNOSIS — I10 Essential (primary) hypertension: Secondary | ICD-10-CM

## 2018-05-09 DIAGNOSIS — R7303 Prediabetes: Secondary | ICD-10-CM

## 2018-05-09 DIAGNOSIS — F411 Generalized anxiety disorder: Secondary | ICD-10-CM | POA: Diagnosis not present

## 2018-05-09 DIAGNOSIS — Z6841 Body Mass Index (BMI) 40.0 and over, adult: Secondary | ICD-10-CM

## 2018-05-09 LAB — POCT GLYCOSYLATED HEMOGLOBIN (HGB A1C): HbA1c, POC (controlled diabetic range): 6.5 % (ref 0.0–7.0)

## 2018-05-09 LAB — GLUCOSE, POCT (MANUAL RESULT ENTRY): POC Glucose: 80 mg/dl (ref 70–99)

## 2018-05-09 MED ORDER — AMLODIPINE BESYLATE 5 MG PO TABS: 5.0000 mg | ORAL_TABLET | Freq: Every day | ORAL | 3 refills | Status: DC

## 2018-05-09 MED ORDER — BUSPIRONE HCL 5 MG PO TABS: 5.0000 mg | ORAL_TABLET | Freq: Two times a day (BID) | ORAL | 2 refills | Status: DC

## 2018-05-09 NOTE — Patient Instructions (Addendum)
Your blood pressure is not at goal.  I recommend adding a new blood pressure medication called amlodipine 5 mg daily.  Try to limit salt in the foods.   We have added a medication called BuSpar 5 mg twice a day to help control your anxiety.  I will have my CMA call you when I have completed your form.  Try to get in some form of regular aerobic exercise at least 3 to 4 days a week for 30 minutes.  Follow a Healthy Eating Plan - You can do it! Limit sugary drinks.  Avoid sodas, sweet tea, sport or energy drinks, or fruit drinks.  Drink water, lo-fat milk, or diet drinks. Limit snack foods.   Cut back on candy, cake, cookies, chips, ice cream.  These are a special treat, only in small amounts. Eat plenty of vegetables.  Especially dark green, red, and orange vegetables. Aim for at least 3 servings a day. More is better! Include fruit in your daily diet.  Whole fruit is much healthier than fruit juice! Limit "white" bread, "white" pasta, "white" rice.   Choose "100% whole grain" products, brown or wild rice. Avoid fatty meats. Try "Meatless Monday" and choose eggs or beans one day a week.  When eating meat, choose lean meats like chicken, [place], and fish.  Grill, broil, or bake meats instead of frying, and eat poultry without the skin. Eat less salt.  Avoid frozen pizzas, frozen dinners and salty foods.  Use seasonings other than salt in cooking.  This can help blood pressure and keep you from swelling Beer, wine and liquor have calories.  If you can safely drink alcohol, limit to 1 drink per day for women, 2 drinks for men

## 2018-05-09 NOTE — Progress Notes (Signed)
Patient ID: Catherine [name], female    DOB: 12-14-1970  MRN: [id number]  CC: pre-DM  Subjective: Catherine [name] is a 48 y.o. female who presents for routine follow-up at [facility].  She last saw Domenica Fail 12/2017 Her concerns today include:  Patient with history of anxiety/depression, HTN, prediabetes (was placed on Amaryl due to intolerace to Metformin), noncompliance   Anxiety/Panic attack: had 3 episodes in the past 1 mth. Last episode occurred at work and last 15-37mins "then after that I was so tired and could not get my thoughts together."  Episodes associated with palpitations, hard to breath, feels over heated and shaky. Dx with anxiety around 2010.  Was followed by therapist at [facility] in 2017. Takes Lexapro consistently.  Not taking Hydroxyzine consistently because she is not used to taking too many medications.  She feels she may not have given the medication a fair trial. -also has a lot going on in her personal life that triggers anxiety attacks. Staying with her daughter who has 4 kids. Just got a job in October. Just got transportation.  Wants to exceed in her job and get her own place.   -has form to allow accomadations at work given her diagnosis. Works at [employer] as a Designer, jewellery.  She is agreeable to me putting on there that due to anxiety episodes she may have to be out of work up to 3 times a month for half a day  HTN: reports compliance with Losartan.  Tries to limit salt in her foods Patient Active Problem List   Diagnosis Date Noted  . Prediabetes 11/09/2017  . Depression with anxiety 08/24/2017  . Hypertension 07/29/2016  . Obesity with serious comorbidity 07/29/2016     Current Outpatient Medications on File Prior to Visit  Medication Sig Dispense Refill  . albuterol (PROVENTIL HFA;VENTOLIN HFA) 108 (90 Base) MCG/ACT inhaler Inhale 1-2 puffs into the lungs every 6 (six) hours as needed for wheezing or shortness of breath. 1 Inhaler  3  . escitalopram (LEXAPRO) 20 MG tablet Take 1 tablet (20 mg total) by mouth daily. 30 tablet 5  . hydrochlorothiazide (HYDRODIURIL) 25 MG tablet TAKE 1 TABLET BY MOUTH DAILY. TAKE ON TABLET IN THE MORNING. 30 tablet 5  . losartan (COZAAR) 50 MG tablet Take 2 tablets (100 mg total) by mouth daily. 60 tablet 5  . fluticasone (FLONASE) 50 MCG/ACT nasal spray Place 2 sprays into both nostrils daily. (Patient not taking: Reported on 05/09/2018) 16 g 2  . glimepiride (AMARYL) 2 MG tablet Take 1 tablet (2 mg total) by mouth daily before breakfast. (Patient not taking: Reported on 05/09/2018) 30 tablet 3  . naproxen (NAPROSYN) 500 MG tablet Take 1 tablet (500 mg total) by mouth 2 (two) times daily with a meal. (Patient not taking: Reported on 05/09/2018) 30 tablet 0  . pravastatin (PRAVACHOL) 40 MG tablet Take 1 tablet (40 mg total) by mouth daily. (Patient not taking: Reported on 05/09/2018) 30 tablet 11   No current facility-administered medications on file prior to visit.     Allergies  Allergen Reactions  . Metformin And Related Other (See Comments)    Abdominal discomfort.  . Latex Rash    Social History   Socioeconomic History  . Marital status: Single    Spouse name: Not on file  . Number of children: Not on file  . Years of education: Not on file  . Highest education level: Not on file  Occupational History  .  Not on file  Social Needs  . Financial resource strain: Not on file  . Food insecurity:    Worry: Not on file    Inability: Not on file  . Transportation needs:    Medical: Not on file    Non-medical: Not on file  Tobacco Use  . Smoking status: Current Every Day Smoker    Packs/day: 0.25    Types: Cigarettes  . Smokeless tobacco: Never Used  Substance and Sexual Activity  . Alcohol use: Yes    Alcohol/week: 0.0 standard drinks    Comment: socially  . Drug use: No  . Sexual activity: Yes    Birth control/protection: Surgical    Comment: 1st intercourse 48 yo-More  than 5 partners-HYST  Lifestyle  . Physical activity:    Days per week: Not on file    Minutes per session: Not on file  . Stress: Not on file  Relationships  . Social connections:    Talks on phone: Not on file    Gets together: Not on file    Attends religious service: Not on file    Active member of club or organization: Not on file    Attends meetings of clubs or organizations: Not on file    Relationship status: Not on file  . Intimate partner violence:    Fear of current or ex partner: Not on file    Emotionally abused: Not on file    Physically abused: Not on file    Forced sexual activity: Not on file  Other Topics Concern  . Not on file  Social History Narrative  . Not on file    Family History  Problem Relation Age of Onset  . Hypertension Mother   . Asthma Mother   . Hypertension Father   . Heart attack Father   . Hypertension Sister   . Asthma Daughter     Past Surgical History:  Procedure Laterality Date  . BUNIONECTOMY  2012  . CESAREAN SECTION     X 2  . LAVH  2009   Leiomyoma and menorrhagia  . MYOMECTOMY  2005    ROS: Review of Systems Negative except as above PHYSICAL EXAM: BP (!) 138/96 (BP Location: Right Arm, Patient Position: Sitting, Cuff Size: Large)   Pulse 64   Temp 97.8 F (36.6 C) (Oral)   Resp 16   Wt (!) 302 lb (137 kg)   SpO2 94% Comment: pt has artificial nails on  BMI 47.30 kg/m   Repeat  blood pressure 140/96 Physical Exam  General appearance - alert, well appearing, middle age AAF and in no distress Mental status - normal mood, behavior, speech, dress, motor activity, and thought processes Neck - supple, no significant adenopathy Chest - clear to auscultation, no wheezes, rales or rhonchi, symmetric air entry Heart - normal rate, regular rhythm, normal S1, S2, no murmurs, rubs, clicks or gallops Extremities - peripheral pulses normal, no pedal edema, no clubbing or cyanosis     Chemistry      Component Value  Date/Time   NA 141 11/09/2017 0949   K 4.0 11/09/2017 0949   CL 100 11/09/2017 0949   CO2 27 11/09/2017 0949   BUN 15 11/09/2017 0949   CREATININE 0.82 11/09/2017 0949   CREATININE 0.81 05/17/2014 1634      Component Value Date/Time   CALCIUM 9.6 11/09/2017 0949   ALKPHOS 60 11/09/2017 0949   AST 14 11/09/2017 0949   ALT 13 11/09/2017 0949   BILITOT  0.5 11/09/2017 0949     Depression screen [facility] 2/9 05/09/2018 01/04/2018 12/13/2017  Decreased Interest 2 1 1   Down, Depressed, Hopeless 3 0 1  PHQ - 2 Score 5 1 2   Altered sleeping 3 1 0  Tired, decreased energy 2 1 1   Change in appetite 1 0 0  Feeling bad or failure about yourself  0 0 1  Trouble concentrating 2 0 3  Moving slowly or fidgety/restless 1 0 1  Suicidal thoughts 0 0 0  PHQ-9 Score 14 3 8   Difficult doing work/chores - - Somewhat difficult   GAD 7 : Generalized Anxiety Score 05/09/2018 01/04/2018 11/09/2017 08/24/2017  Nervous, Anxious, on Edge 2 0 1 1  Control/stop worrying 2 0 1 1  Worry too much - different things 2 1 2 1   Trouble relaxing 1 1 1 1   Restless 1 0 0 0  Easily annoyed or irritable 3 1 1 2   Afraid - awful might happen 2 1 1  0  Total GAD 7 Score 13 4 7 6   Anxiety Difficulty - - - -   BS 80 A1C 6.5  ASSESSMENT AND PLAN: 1. Generalized anxiety disorder We will have her discontinue hydroxyzine.  Continue Lexapro.  We will add a low-dose of BuSpar.  We discussed getting her into a mental health provider.  Patient states that she has EAP through her employer and will utilize that first as it allows for 5 free sessions of counseling.  I will complete her reasonable accommodation form for her employer and will have my CMA call her to pick up when ready. - busPIRone (BUSPAR) 5 MG tablet; Take 1 tablet (5 mg total) by mouth 2 (two) times daily.  Dispense: 60 tablet; Refill: 2  2. Essential hypertension Not at goal.  Discussed DASH diet.  Add low-dose amlodipine. - amLODipine (NORVASC) 5 MG tablet; Take 1  tablet (5 mg total) by mouth daily.  Dispense: 90 tablet; Refill: 3  3. Prediabetes 4. Morbid obesity ([place]) Discussed the importance of healthy eating habits and regular exercise.  Patient plans to start an exercise program as she does have a gym available to her at work.  Discussed healthy eating habits to include eliminating sugary drinks, eating more white meat than red meat, and cutting back on white carbohydrates.  Her A1c today is at the level for diagnosis of diabetes but fasting blood sugar is in normal range.  Given the discordance, she will need to have a repeat A1c on next visit before diagnosing her with diabetes. - Glucose (CBG) - HgB A1c   Patient was given the opportunity to ask questions.  Patient verbalized understanding of the plan and was able to repeat key elements of the plan.   Orders Placed This Encounter  Procedures  . Glucose (CBG)  . HgB A1c     Requested Prescriptions   Signed Prescriptions Disp Refills  . busPIRone (BUSPAR) 5 MG tablet 60 tablet 2    Sig: Take 1 tablet (5 mg total) by mouth 2 (two) times daily.  Marland Kitchen amLODipine (NORVASC) 5 MG tablet 90 tablet 3    Sig: Take 1 tablet (5 mg total) by mouth daily.    Return in about 1 month (around 06/09/2018).  Karle Plumber, MD, FACP

## 2018-05-09 NOTE — Progress Notes (Signed)
Anxiety and depression: Having more panic attacks since Christmas  CBG- 80   HgA1C 6.5

## 2018-06-06 ENCOUNTER — Ambulatory Visit (INDEPENDENT_AMBULATORY_CARE_PROVIDER_SITE_OTHER): Payer: 59 | Admitting: Primary Care

## 2018-06-06 ENCOUNTER — Other Ambulatory Visit: Payer: Self-pay

## 2018-06-06 ENCOUNTER — Encounter (INDEPENDENT_AMBULATORY_CARE_PROVIDER_SITE_OTHER): Payer: Self-pay | Admitting: Primary Care

## 2018-06-06 VITALS — BP 139/87 | HR 71 | Temp 97.9°F | Ht 67.0 in | Wt 301.4 lb

## 2018-06-06 DIAGNOSIS — Z6841 Body Mass Index (BMI) 40.0 and over, adult: Secondary | ICD-10-CM

## 2018-06-06 DIAGNOSIS — I10 Essential (primary) hypertension: Secondary | ICD-10-CM

## 2018-06-06 NOTE — Progress Notes (Signed)
Established Patient Office Visit  Subjective:  Patient ID: Catherine [name], female    DOB: 1971-01-03  Age: 48 y.o. MRN: [id number]  CC:  Chief Complaint  Patient presents with  . Follow-up    HTN    HPI Catherine [name] presents for Bp check after medication change. She has a history of anxiety/depression, HTN, and prediabetes.  Past Medical History:  Diagnosis Date  . Anxiety   . Bronchitis   . Depression   . Hypertension   . Sleep apnea     Past Surgical History:  Procedure Laterality Date  . BUNIONECTOMY  2012  . CESAREAN SECTION     X 2  . LAVH  2009   Leiomyoma and menorrhagia  . MYOMECTOMY  2005    Family History  Problem Relation Age of Onset  . Hypertension Mother   . Asthma Mother   . Hypertension Father   . Heart attack Father   . Hypertension Sister   . Asthma Daughter     Social History   Socioeconomic History  . Marital status: Single    Spouse name: Not on file  . Number of children: Not on file  . Years of education: Not on file  . Highest education level: Not on file  Occupational History  . Not on file  Social Needs  . Financial resource strain: Not on file  . Food insecurity:    Worry: Not on file    Inability: Not on file  . Transportation needs:    Medical: Not on file    Non-medical: Not on file  Tobacco Use  . Smoking status: Current Every Day Smoker    Packs/day: 0.25    Types: Cigarettes  . Smokeless tobacco: Never Used  Substance and Sexual Activity  . Alcohol use: Yes    Alcohol/week: 0.0 standard drinks    Comment: socially  . Drug use: No  . Sexual activity: Yes    Birth control/protection: Surgical    Comment: 1st intercourse 48 yo-More than 5 partners-HYST  Lifestyle  . Physical activity:    Days per week: Not on file    Minutes per session: Not on file  . Stress: Not on file  Relationships  . Social connections:    Talks on phone: Not on file    Gets together: Not on file    Attends religious service:  Not on file    Active member of club or organization: Not on file    Attends meetings of clubs or organizations: Not on file    Relationship status: Not on file  . Intimate partner violence:    Fear of current or ex partner: Not on file    Emotionally abused: Not on file    Physically abused: Not on file    Forced sexual activity: Not on file  Other Topics Concern  . Not on file  Social History Narrative  . Not on file    Outpatient Medications Prior to Visit  Medication Sig Dispense Refill  . albuterol (PROVENTIL HFA;VENTOLIN HFA) 108 (90 Base) MCG/ACT inhaler Inhale 1-2 puffs into the lungs every 6 (six) hours as needed for wheezing or shortness of breath. 1 Inhaler 3  . amLODipine (NORVASC) 5 MG tablet Take 1 tablet (5 mg total) by mouth daily. 90 tablet 3  . busPIRone (BUSPAR) 5 MG tablet Take 1 tablet (5 mg total) by mouth 2 (two) times daily. 60 tablet 2  . escitalopram (LEXAPRO) 20 MG tablet Take  1 tablet (20 mg total) by mouth daily. 30 tablet 5  . glimepiride (AMARYL) 2 MG tablet Take 1 tablet (2 mg total) by mouth daily before breakfast. 30 tablet 3  . hydrochlorothiazide (HYDRODIURIL) 25 MG tablet TAKE 1 TABLET BY MOUTH DAILY. TAKE ON TABLET IN THE MORNING. 30 tablet 5  . losartan (COZAAR) 50 MG tablet Take 2 tablets (100 mg total) by mouth daily. 60 tablet 5  . naproxen (NAPROSYN) 500 MG tablet Take 1 tablet (500 mg total) by mouth 2 (two) times daily with a meal. 30 tablet 0  . pravastatin (PRAVACHOL) 40 MG tablet Take 1 tablet (40 mg total) by mouth daily. 30 tablet 11  . fluticasone (FLONASE) 50 MCG/ACT nasal spray Place 2 sprays into both nostrils daily. 16 g 2   No facility-administered medications prior to visit.     Allergies  Allergen Reactions  . Metformin And Related Other (See Comments)    Abdominal discomfort.  . Latex Rash    ROS Review of Systems    Objective:    Physical Exam  BP 139/87 (BP Location: Left Arm, Patient Position: Sitting, Cuff  Size: Large)   Pulse 71   Temp 97.9 F (36.6 C) (Oral)   Ht 5\' 7"  (1.702 m)   Wt (!) 301 lb 6.4 oz (136.7 kg)   SpO2 98%   BMI 47.21 kg/m  Wt Readings from Last 3 Encounters:  06/06/18 (!) 301 lb 6.4 oz (136.7 kg)  05/09/18 (!) 302 lb (137 kg)  01/04/18 291 lb 12.8 oz (132.4 kg)     Health Maintenance Due  Topic Date Due  . FOOT EXAM  06/11/1980  . OPHTHALMOLOGY EXAM  06/11/1980  . PAP SMEAR-Modifier  05/16/2014  . MAMMOGRAM  05/23/2016    There are no preventive care reminders to display for this patient.  Lab Results  Component Value Date   TSH 2.370 06/22/2016   Lab Results  Component Value Date   WBC 9.4 06/28/2016   HGB 13.2 06/28/2016   HCT 41.1 06/28/2016   MCV 93.6 06/28/2016   PLT 258 06/28/2016   Lab Results  Component Value Date   NA 141 11/09/2017   K 4.0 11/09/2017   CO2 27 11/09/2017   GLUCOSE 108 (H) 11/09/2017   BUN 15 11/09/2017   CREATININE 0.82 11/09/2017   BILITOT 0.5 11/09/2017   ALKPHOS 60 11/09/2017   AST 14 11/09/2017   ALT 13 11/09/2017   PROT 6.5 11/09/2017   ALBUMIN 4.3 11/09/2017   CALCIUM 9.6 11/09/2017   ANIONGAP 9 06/28/2016   Lab Results  Component Value Date   CHOL 163 11/09/2017   Lab Results  Component Value Date   HDL 46 11/09/2017   Lab Results  Component Value Date   LDLCALC 101 (H) 11/09/2017   Lab Results  Component Value Date   TRIG 79 11/09/2017   Lab Results  Component Value Date   CHOLHDL 3.5 11/09/2017   Lab Results  Component Value Date   HGBA1C 6.5 05/09/2018      Assessment & Plan:   Problem List Items Addressed This Visit    Hypertension - Primary Remains slightly elevated however she has/is reducing her salt intake and planning on exercising cont meds as ordered        Follow-up: Return in about 3 months (around 09/04/2018) for obesity,.    Kerin Perna, NP

## 2018-06-06 NOTE — Patient Instructions (Signed)
Place hypertension patient instructions here.  

## 2018-07-01 ENCOUNTER — Other Ambulatory Visit (INDEPENDENT_AMBULATORY_CARE_PROVIDER_SITE_OTHER): Payer: Self-pay

## 2018-07-01 DIAGNOSIS — Z760 Encounter for issue of repeat prescription: Secondary | ICD-10-CM

## 2018-07-01 MED ORDER — GLIMEPIRIDE 2 MG PO TABS: 2.0000 mg | ORAL_TABLET | Freq: Every day | ORAL | 0 refills | Status: DC

## 2018-07-01 NOTE — Telephone Encounter (Signed)
Received fax from patient pharmacy for refill. Nat Christen, CMA

## 2018-09-05 ENCOUNTER — Encounter: Payer: Self-pay | Admitting: Primary Care

## 2018-09-05 ENCOUNTER — Other Ambulatory Visit: Payer: Self-pay

## 2018-09-05 ENCOUNTER — Ambulatory Visit: Payer: 59 | Attending: Primary Care | Admitting: Primary Care

## 2018-09-05 DIAGNOSIS — R7303 Prediabetes: Secondary | ICD-10-CM

## 2018-09-05 DIAGNOSIS — I10 Essential (primary) hypertension: Secondary | ICD-10-CM

## 2018-09-05 DIAGNOSIS — F418 Other specified anxiety disorders: Secondary | ICD-10-CM

## 2018-09-05 DIAGNOSIS — E66813 Obesity, class 3: Secondary | ICD-10-CM

## 2018-09-05 NOTE — Progress Notes (Signed)
Patient verified DOB Patient has taken medication for BP. Patient has eaten today. Patient denies pain at this time.  Patient started itworks- the fat burners and the skinny coffee and would like to share with PCP. Patient has also ordered a water bottle to encourage water intake.

## 2018-09-05 NOTE — Progress Notes (Signed)
Virtual Visit via Telephone Note  I connected with Catherine [name] on 09/05/18 at  3:52 PM EDT by telephone and verified that I am speaking with the correct person using two identifiers.   I discussed the limitations, risks, security and privacy concerns of performing an evaluation and management service by telephone and the availability of in person appointments. I also discussed with the patient that there may be a patient responsible charge related to this service. The patient expressed understanding and agreed to proceed.   History of Present Illness: [name] presents today to discuss weight loss. She recently purchased Carb control and skinny brew. Advised to take to pharmacist or call to ask for any interactions with prescription medication. She has a history of anxiety/depression, HTN, and prediabetes.   Observations/Objective: Review of Systems  Constitutional: Negative.   HENT: Negative.   Eyes: Negative.   Respiratory: Negative.   Cardiovascular: Negative.   Gastrointestinal: Positive for constipation.  Genitourinary: Negative.   Musculoskeletal: Negative.   Skin: Negative.   Neurological: Negative.   Endo/Heme/Allergies: Negative.   Psychiatric/Behavioral: Negative.     Assessment and Plan: Catherine [name] was seen today for obesity.  Diagnoses and all orders for this visit:  Class 3 severe obesity with serious comorbidity in adult, unspecified BMI, unspecified obesity type ([place]) Recently purchased  Carb control and skinny brew.Saw on Face book with amazing weight loss results asked her to see if FDA approved unable to locate. Ask to call pharmacy and have them to run a interaction ck on her prescribed medications.   Essential hypertension Currently on amlodipine and / HCTZ she will purchase a Bp machine and than keep a record of readings. Instructed to call with 2 weeks of readings morning, noon and night   Depression with  Currently on Buspar and escitalopram express  no thoughts of harming self or others working from home and being locked in to the house can be overwhelming.  Prediabetes A1C in 04/2018 6.3  Discussed diet modification and exercise. Carbs reduction rice, pasta, potatoes , bread and sugars    Follow Up Instructions:    I discussed the assessment and treatment plan with the patient. The patient was provided an opportunity to ask questions and all were answered. The patient agreed with the plan and demonstrated an understanding of the instructions.   The patient was advised to call back or seek an in-person evaluation if the symptoms worsen or if the condition fails to improve as anticipated.  I provided 26 minutes of non-face-to-face time during this encounter.   Kerin Perna, NP

## 2018-09-06 ENCOUNTER — Encounter: Payer: Self-pay | Admitting: Primary Care

## 2018-09-08 ENCOUNTER — Telehealth: Payer: Self-pay | Admitting: Primary Care

## 2018-09-08 NOTE — Telephone Encounter (Signed)
Patients call returned.  Patient identified by name and date of birth.  Patient states that she is ut of her medication and that the pharmacy had called and stated that her medications could not be fill due to her insurance stating that they will only allow prescriptions for 90 days.    Please make all prescriptions 90 days.  Patient advised that a return call would be made when action had been performed.  Patient acknowledged understanding of advice.

## 2018-09-08 NOTE — Telephone Encounter (Signed)
Patient called stating she needs a 90 day supply for her losartan and for all her medications for insurance coverage. Please follow up

## 2018-09-09 NOTE — Telephone Encounter (Signed)
Patient called wanting to know the status of her refill. Patient states she has been without medication for the past three days.   Please advice [phone]  Thank you Emmit Pomfret

## 2018-09-13 ENCOUNTER — Other Ambulatory Visit: Payer: Self-pay | Admitting: Primary Care

## 2018-09-13 DIAGNOSIS — Z760 Encounter for issue of repeat prescription: Secondary | ICD-10-CM

## 2018-09-13 DIAGNOSIS — F411 Generalized anxiety disorder: Secondary | ICD-10-CM

## 2018-09-13 MED ORDER — HYDROCHLOROTHIAZIDE 25 MG PO TABS: ORAL_TABLET | ORAL | 0 refills | Status: DC

## 2018-09-13 MED ORDER — GLIMEPIRIDE 2 MG PO TABS: 2.0000 mg | ORAL_TABLET | Freq: Every day | ORAL | 0 refills | Status: DC

## 2018-09-13 MED ORDER — LOSARTAN POTASSIUM 50 MG PO TABS: 100.0000 mg | ORAL_TABLET | Freq: Every day | ORAL | 0 refills | Status: DC

## 2018-09-13 MED ORDER — BUSPIRONE HCL 5 MG PO TABS: 5.0000 mg | ORAL_TABLET | Freq: Two times a day (BID) | ORAL | 0 refills | Status: DC

## 2018-09-13 MED ORDER — ESCITALOPRAM OXALATE 20 MG PO TABS: 20.0000 mg | ORAL_TABLET | Freq: Every day | ORAL | 0 refills | Status: DC

## 2018-10-21 ENCOUNTER — Other Ambulatory Visit: Payer: Self-pay | Admitting: Primary Care

## 2018-10-21 DIAGNOSIS — Z760 Encounter for issue of repeat prescription: Secondary | ICD-10-CM

## 2018-10-31 ENCOUNTER — Ambulatory Visit (INDEPENDENT_AMBULATORY_CARE_PROVIDER_SITE_OTHER): Payer: 59 | Admitting: Primary Care

## 2018-11-14 ENCOUNTER — Other Ambulatory Visit: Payer: Self-pay

## 2018-11-14 ENCOUNTER — Encounter (INDEPENDENT_AMBULATORY_CARE_PROVIDER_SITE_OTHER): Payer: Self-pay | Admitting: Primary Care

## 2018-11-14 ENCOUNTER — Ambulatory Visit (INDEPENDENT_AMBULATORY_CARE_PROVIDER_SITE_OTHER): Payer: 59 | Admitting: Primary Care

## 2018-11-14 VITALS — BP 118/81 | HR 86 | Temp 98.9°F | Ht 67.0 in | Wt 301.4 lb

## 2018-11-14 DIAGNOSIS — F411 Generalized anxiety disorder: Secondary | ICD-10-CM

## 2018-11-14 DIAGNOSIS — L600 Ingrowing nail: Secondary | ICD-10-CM

## 2018-11-14 DIAGNOSIS — E782 Mixed hyperlipidemia: Secondary | ICD-10-CM | POA: Diagnosis not present

## 2018-11-14 DIAGNOSIS — R69 Illness, unspecified: Secondary | ICD-10-CM | POA: Diagnosis not present

## 2018-11-14 DIAGNOSIS — G4730 Sleep apnea, unspecified: Secondary | ICD-10-CM

## 2018-11-14 DIAGNOSIS — Z760 Encounter for issue of repeat prescription: Secondary | ICD-10-CM | POA: Diagnosis not present

## 2018-11-14 DIAGNOSIS — E119 Type 2 diabetes mellitus without complications: Secondary | ICD-10-CM | POA: Diagnosis not present

## 2018-11-14 DIAGNOSIS — I10 Essential (primary) hypertension: Secondary | ICD-10-CM

## 2018-11-14 LAB — POCT GLYCOSYLATED HEMOGLOBIN (HGB A1C): Hemoglobin A1C: 6.7 % — AB (ref 4.0–5.6)

## 2018-11-14 LAB — POCT CBG (FASTING - GLUCOSE)-MANUAL ENTRY: Glucose Fasting, POC: 111 mg/dL — AB (ref 70–99)

## 2018-11-14 MED ORDER — ESCITALOPRAM OXALATE 20 MG PO TABS: 20.0000 mg | ORAL_TABLET | Freq: Every day | ORAL | 1 refills | Status: DC

## 2018-11-14 MED ORDER — GLIMEPIRIDE 2 MG PO TABS: 2.0000 mg | ORAL_TABLET | Freq: Every day | ORAL | 1 refills | Status: DC

## 2018-11-14 MED ORDER — HYDROCHLOROTHIAZIDE 25 MG PO TABS: ORAL_TABLET | ORAL | 1 refills | Status: DC

## 2018-11-14 MED ORDER — BUSPIRONE HCL 5 MG PO TABS: 5.0000 mg | ORAL_TABLET | Freq: Two times a day (BID) | ORAL | 1 refills | Status: DC

## 2018-11-14 MED ORDER — PRAVASTATIN SODIUM 40 MG PO TABS: 40.0000 mg | ORAL_TABLET | Freq: Every day | ORAL | 1 refills | Status: DC

## 2018-11-14 MED ORDER — LOSARTAN POTASSIUM 50 MG PO TABS: 100.0000 mg | ORAL_TABLET | Freq: Every day | ORAL | 1 refills | Status: DC

## 2018-11-14 MED ORDER — AMLODIPINE BESYLATE 5 MG PO TABS: 5.0000 mg | ORAL_TABLET | Freq: Every day | ORAL | 1 refills | Status: DC

## 2018-11-14 MED ORDER — ALBUTEROL SULFATE HFA 108 (90 BASE) MCG/ACT IN AERS: 1.0000 | INHALATION_SPRAY | Freq: Four times a day (QID) | RESPIRATORY_TRACT | 1 refills | Status: DC | PRN

## 2018-11-14 NOTE — Progress Notes (Signed)
Established Patient Office Visit  Subjective:  Patient ID: Catherine [name], female    DOB: 04-26-1970  Age: 48 y.o. MRN: [id number]  CC:  Chief Complaint  Patient presents with  . Follow-up    DIABETES     HPI Catherine [name] presents for the management of hypertension-Denies shortness of breath, headaches, chest pain or lower extremity edema, hyperlipidemia , type 2 diabetes and anxiety. PMH: see below   Past Medical History:  Diagnosis Date  . Anxiety   . Bronchitis   . Depression   . Hypertension   . Sleep apnea     Past Surgical History:  Procedure Laterality Date  . BUNIONECTOMY  2012  . CESAREAN SECTION     X 2  . LAVH  2009   Leiomyoma and menorrhagia  . MYOMECTOMY  2005    Family History  Problem Relation Age of Onset  . Hypertension Mother   . Asthma Mother   . Hypertension Father   . Heart attack Father   . Hypertension Sister   . Asthma Daughter     Social History   Socioeconomic History  . Marital status: Single    Spouse name: Not on file  . Number of children: Not on file  . Years of education: Not on file  . Highest education level: Not on file  Occupational History  . Not on file  Social Needs  . Financial resource strain: Not on file  . Food insecurity    Worry: Not on file    Inability: Not on file  . Transportation needs    Medical: Not on file    Non-medical: Not on file  Tobacco Use  . Smoking status: Current Every Day Smoker    Packs/day: 0.50    Types: Cigarettes  . Smokeless tobacco: Never Used  Substance and Sexual Activity  . Alcohol use: Yes    Alcohol/week: 0.0 standard drinks    Comment: socially  . Drug use: No  . Sexual activity: Yes    Birth control/protection: Surgical    Comment: 1st intercourse 48 yo-More than 5 partners-HYST  Lifestyle  . Physical activity    Days per week: Not on file    Minutes per session: Not on file  . Stress: Not on file  Relationships  . Social Herbalist on phone: Not  on file    Gets together: Not on file    Attends religious service: Not on file    Active member of club or organization: Not on file    Attends meetings of clubs or organizations: Not on file    Relationship status: Not on file  . Intimate partner violence    Fear of current or ex partner: Not on file    Emotionally abused: Not on file    Physically abused: Not on file    Forced sexual activity: Not on file  Other Topics Concern  . Not on file  Social History Narrative  . Not on file    Outpatient Medications Prior to Visit  Medication Sig Dispense Refill  . albuterol (PROVENTIL HFA;VENTOLIN HFA) 108 (90 Base) MCG/ACT inhaler Inhale 1-2 puffs into the lungs every 6 (six) hours as needed for wheezing or shortness of breath. 1 Inhaler 3  . amLODipine (NORVASC) 5 MG tablet Take 1 tablet (5 mg total) by mouth daily. 90 tablet 3  . busPIRone (BUSPAR) 5 MG tablet Take 1 tablet (5 mg total) by mouth 2 (two) times  daily. 180 tablet 0  . escitalopram (LEXAPRO) 20 MG tablet Take 1 tablet (20 mg total) by mouth daily. 90 tablet 0  . glimepiride (AMARYL) 2 MG tablet Take 1 tablet (2 mg total) by mouth daily before breakfast. 90 tablet 0  . hydrochlorothiazide (HYDRODIURIL) 25 MG tablet TAKE 1 TABLET BY MOUTH DAILY. TAKE ON TABLET IN THE MORNING. 90 tablet 0  . losartan (COZAAR) 50 MG tablet TAKE 2 TABLETS BY MOUTH EVERY DAY 180 tablet 0  . naproxen (NAPROSYN) 500 MG tablet Take 1 tablet (500 mg total) by mouth 2 (two) times daily with a meal. (Patient not taking: Reported on 09/05/2018) 30 tablet 0  . pravastatin (PRAVACHOL) 40 MG tablet Take 1 tablet (40 mg total) by mouth daily. (Patient not taking: Reported on 11/14/2018) 30 tablet 11   No facility-administered medications prior to visit.     Allergies  Allergen Reactions  . Metformin And Related Other (See Comments)    Abdominal discomfort.  . Latex Rash    ROS Review of Systems  All other systems reviewed and are negative.      Objective:    Physical Exam  Constitutional: She is oriented to person, place, and time. She appears well-developed and well-nourished.  HENT:  Head: Normocephalic.  Neck: Normal range of motion. Neck supple.  Cardiovascular: Normal rate and regular rhythm.  Pulmonary/Chest: Effort normal and breath sounds normal.  Abdominal: Soft. Bowel sounds are normal. She exhibits distension.  Musculoskeletal: Normal range of motion.  Neurological: She is oriented to person, place, and time.  Skin: Skin is warm and dry.  Psychiatric: She has a normal mood and affect.  Sensory exam of the foot is normal, tested with the monofilament. Good pulses, no lesions or ulcers, good peripheral pulses.  BP 118/81 (BP Location: Left Arm, Patient Position: Sitting, Cuff Size: Large)   Pulse 86   Temp 98.9 F (37.2 C) (Tympanic)   Ht '5\' 7"'$  (1.702 m)   Wt (!) 301 lb 6.4 oz (136.7 kg)   SpO2 97%   BMI 47.21 kg/m  Wt Readings from Last 3 Encounters:  11/14/18 (!) 301 lb 6.4 oz (136.7 kg)  06/06/18 (!) 301 lb 6.4 oz (136.7 kg)  05/09/18 (!) 302 lb (137 kg)     Health Maintenance Due  Topic Date Due  . FOOT EXAM  06/11/1980  . OPHTHALMOLOGY EXAM  06/11/1980  . PAP SMEAR-Modifier  05/16/2014  . HEMOGLOBIN A1C  11/07/2018    There are no preventive care reminders to display for this patient.  Lab Results  Component Value Date   TSH 2.370 06/22/2016   Lab Results  Component Value Date   WBC 9.4 06/28/2016   HGB 13.2 06/28/2016   HCT 41.1 06/28/2016   MCV 93.6 06/28/2016   PLT 258 06/28/2016   Lab Results  Component Value Date   NA 141 11/09/2017   K 4.0 11/09/2017   CO2 27 11/09/2017   GLUCOSE 108 (H) 11/09/2017   BUN 15 11/09/2017   CREATININE 0.82 11/09/2017   BILITOT 0.5 11/09/2017   ALKPHOS 60 11/09/2017   AST 14 11/09/2017   ALT 13 11/09/2017   PROT 6.5 11/09/2017   ALBUMIN 4.3 11/09/2017   CALCIUM 9.6 11/09/2017   ANIONGAP 9 06/28/2016   Lab Results  Component Value Date    CHOL 163 11/09/2017   Lab Results  Component Value Date   HDL 46 11/09/2017   Lab Results  Component Value Date   LDLCALC 101 (H)  11/09/2017   Lab Results  Component Value Date   TRIG 79 11/09/2017   Lab Results  Component Value Date   CHOLHDL 3.5 11/09/2017   Lab Results  Component Value Date   HGBA1C 6.7 (A) 11/14/2018      Assessment & Plan:   Problem List Items Addressed This Visit    Hypertension - Primary   Relevant Medications   amLODipine (NORVASC) 5 MG tablet   pravastatin (PRAVACHOL) 40 MG tablet   hydrochlorothiazide (HYDRODIURIL) 25 MG tablet   losartan (COZAAR) 50 MG tablet   Other Relevant Orders   Lipid Panel   CMP14+EGFR   Generalized anxiety disorder   Relevant Medications   busPIRone (BUSPAR) 5 MG tablet   escitalopram (LEXAPRO) 20 MG tablet    Other Visit Diagnoses    Morbid obesity ([place])       Relevant Medications   glimepiride (AMARYL) 2 MG tablet   Medication refill       Relevant Medications   pravastatin (PRAVACHOL) 40 MG tablet   escitalopram (LEXAPRO) 20 MG tablet   glimepiride (AMARYL) 2 MG tablet   albuterol (VENTOLIN HFA) 108 (90 Base) MCG/ACT inhaler   hydrochlorothiazide (HYDRODIURIL) 25 MG tablet   losartan (COZAAR) 50 MG tablet   Sleep apnea, unspecified type       Mixed hyperlipidemia       Relevant Medications   amLODipine (NORVASC) 5 MG tablet   pravastatin (PRAVACHOL) 40 MG tablet   hydrochlorothiazide (HYDRODIURIL) 25 MG tablet   losartan (COZAAR) 50 MG tablet   Other Relevant Orders   Lipid Panel   Type 2 diabetes mellitus without complication, without long-term current use of insulin (HCC)       Relevant Medications   pravastatin (PRAVACHOL) 40 MG tablet   glimepiride (AMARYL) 2 MG tablet   losartan (COZAAR) 50 MG tablet   Other Relevant Orders   CBC with Differential   HgB A1c (Completed)   Glucose (CBG), Fasting (Completed)   HM Diabetes Foot Exam (Completed)   Ambulatory referral to Ophthalmology     Catherine [name] was seen today for follow-up.  Diagnoses and all orders for this visit:  Essential hypertension Well controlled on current Bp medication .Counseled on blood pressure goal of less than 130/80, low-sodium, DASH diet, medication compliance, 150 minutes of moderate intensity exercise per week. Discussed medication compliance, adverse effects. -     Lipid Panel -     CMP14+EGFR  Morbid obesity ([place]) Obesity is 30-39 indicating an excess in caloric intake or underlining conditions. This may lead to other co-morbidities. Lifestyle modifications of diet and exercise may reduce obesity.   Generalized anxiety disorder Buspar is controlling panic attacks  We discussed options for treatment of anxiety including therapy and/or medication.  Will check basic labs to ensure thyroid is in normal range and that no other metabolic issues are obvious.  Reviewed concept of anxiety as biochemical imbalance of neurotransmitters and rationale for treatment. Discussed potential risks, expected benefits, possible side effects of the medicine. We also discussed how to take it correctly and dosing instructions. If she has any significant side effects to the medicine, she is to stop it and call for advice.  Instructed patient to contact office or on-call physician promptly should condition worsen or any new symptoms appear.    She was agreeable with this plan.   Sleep apnea, unspecified type Test done  01/04/2017 will refer for OSA  Mixed hyperlipidemia  Healthy lifestyle diet of fruits vegetables fish nuts whole  grains and low saturated fat . Foods high in cholesterol or liver, fatty meats,cheese, butter avocados, nuts and seeds, chocolate and fried foods. -     Lipid Panel  Type 2 diabetes mellitus without complication, without long-term current use of insulin (HCC) ADA recommends the following therapeutic goals for glycemic control related to A1c measurements: Goal of therapy: Less than 6.5 hemoglobin  A1c.  Reference clinical practice recommendations. Foods that are high in carbohydrates are the following rice, potatoes, breads, sugars, and pastas.  Reduction in the intake (eating) will assist in lowering your blood sugars. -     CBC with Differential -     HgB A1c -     Glucose (CBG), Fasting -     HM Diabetes Foot Exam -     Ambulatory referral to Ophthalmology  Onychocryptosis Refer to podiatrist or orthopedist for foot care of ingrown clubbing toenails  Medication refill Meds ordered this encounter  Medications  . amLODipine (NORVASC) 5 MG tablet    Sig: Take 1 tablet (5 mg total) by mouth daily.    Dispense:  90 tablet    Refill:  1  . busPIRone (BUSPAR) 5 MG tablet    Sig: Take 1 tablet (5 mg total) by mouth 2 (two) times daily.    Dispense:  180 tablet    Refill:  1  . pravastatin (PRAVACHOL) 40 MG tablet    Sig: Take 1 tablet (40 mg total) by mouth daily.    Dispense:  90 tablet    Refill:  1  . escitalopram (LEXAPRO) 20 MG tablet    Sig: Take 1 tablet (20 mg total) by mouth daily.    Dispense:  90 tablet    Refill:  1  . glimepiride (AMARYL) 2 MG tablet    Sig: Take 1 tablet (2 mg total) by mouth daily before breakfast.    Dispense:  90 tablet    Refill:  1  . albuterol (VENTOLIN HFA) 108 (90 Base) MCG/ACT inhaler    Sig: Inhale 1-2 puffs into the lungs every 6 (six) hours as needed for wheezing or shortness of breath.    Dispense:  6.7 g    Refill:  1  . hydrochlorothiazide (HYDRODIURIL) 25 MG tablet    Sig: TAKE 1 TABLET BY MOUTH DAILY. TAKE ON TABLET IN THE MORNING.    Dispense:  90 tablet    Refill:  1  . losartan (COZAAR) 50 MG tablet    Sig: Take 2 tablets (100 mg total) by mouth daily.    Dispense:  180 tablet    Refill:  1    Follow-up: Return in about 6 months (around 05/17/2019) for HTN, DM, anxiety.    Kerin Perna, NP

## 2018-11-14 NOTE — Patient Instructions (Signed)
Calorie Counting for Weight Loss Calories are units of energy. Your body needs a certain amount of calories from food to keep you going throughout the day. When you eat more calories than your body needs, your body stores the extra calories as fat. When you eat fewer calories than your body needs, your body burns fat to get the energy it needs. Calorie counting means keeping track of how many calories you eat and drink each day. Calorie counting can be helpful if you need to lose weight. If you make sure to eat fewer calories than your body needs, you should lose weight. Ask your health care provider what a healthy weight is for you. For calorie counting to work, you will need to eat the right number of calories in a day in order to lose a healthy amount of weight per week. A dietitian can help you determine how many calories you need in a day and will give you suggestions on how to reach your calorie goal.  A healthy amount of weight to lose per week is usually 1-2 lb (0.5-0.9 kg). This usually means that your daily calorie intake should be reduced by 500-750 calories.  Eating 1,200 - 1,500 calories per day can help most women lose weight.  Eating 1,500 - 1,800 calories per day can help most men lose weight. What is my plan? My goal is to have __________ calories per day. If I have this many calories per day, I should lose around __________ pounds per week. What do I need to know about calorie counting? In order to meet your daily calorie goal, you will need to:  Find out how many calories are in each food you would like to eat. Try to do this before you eat.  Decide how much of the food you plan to eat.  Write down what you ate and how many calories it had. Doing this is called keeping a food log. To successfully lose weight, it is important to balance calorie counting with a healthy lifestyle that includes regular activity. Aim for 150 minutes of moderate exercise (such as walking) or 75  minutes of vigorous exercise (such as running) each week. Where do I find calorie information?  The number of calories in a food can be found on a Nutrition Facts label. If a food does not have a Nutrition Facts label, try to look up the calories online or ask your dietitian for help. Remember that calories are listed per serving. If you choose to have more than one serving of a food, you will have to multiply the calories per serving by the amount of servings you plan to eat. For example, the label on a package of bread might say that a serving size is 1 slice and that there are 90 calories in a serving. If you eat 1 slice, you will have eaten 90 calories. If you eat 2 slices, you will have eaten 180 calories. How do I keep a food log? Immediately after each meal, record the following information in your food log:  What you ate. Don't forget to include toppings, sauces, and other extras on the food.  How much you ate. This can be measured in cups, ounces, or number of items.  How many calories each food and drink had.  The total number of calories in the meal. Keep your food log near you, such as in a small notebook in your pocket, or use a mobile app or website. Some programs will calculate   calories for you and show you how many calories you have left for the day to meet your goal. What are some calorie counting tips?   Use your calories on foods and drinks that will fill you up and not leave you hungry: ? Some examples of foods that fill you up are nuts and nut butters, vegetables, lean proteins, and high-fiber foods like whole grains. High-fiber foods are foods with more than 5 g fiber per serving. ? Drinks such as sodas, specialty coffee drinks, alcohol, and juices have a lot of calories, yet do not fill you up.  Eat nutritious foods and avoid empty calories. Empty calories are calories you get from foods or beverages that do not have many vitamins or protein, such as candy, sweets, and  soda. It is better to have a nutritious high-calorie food (such as an avocado) than a food with few nutrients (such as a bag of chips).  Know how many calories are in the foods you eat most often. This will help you calculate calorie counts faster.  Pay attention to calories in drinks. Low-calorie drinks include water and unsweetened drinks.  Pay attention to nutrition labels for "low fat" or "fat free" foods. These foods sometimes have the same amount of calories or more calories than the full fat versions. They also often have added sugar, starch, or salt, to make up for flavor that was removed with the fat.  Find a way of tracking calories that works for you. Get creative. Try different apps or programs if writing down calories does not work for you. What are some portion control tips?  Know how many calories are in a serving. This will help you know how many servings of a certain food you can have.  Use a measuring cup to measure serving sizes. You could also try weighing out portions on a kitchen scale. With time, you will be able to estimate serving sizes for some foods.  Take some time to put servings of different foods on your favorite plates, bowls, and cups so you know what a serving looks like.  Try not to eat straight from a bag or box. Doing this can lead to overeating. Put the amount you would like to eat in a cup or on a plate to make sure you are eating the right portion.  Use smaller plates, glasses, and bowls to prevent overeating.  Try not to multitask (for example, watch TV or use your computer) while eating. If it is time to eat, sit down at a table and enjoy your food. This will help you to know when you are full. It will also help you to be aware of what you are eating and how much you are eating. What are tips for following this plan? Reading food labels  Check the calorie count compared to the serving size. The serving size may be smaller than what you are used to  eating.  Check the source of the calories. Make sure the food you are eating is high in vitamins and protein and low in saturated and trans fats. Shopping  Read nutrition labels while you shop. This will help you make healthy decisions before you decide to purchase your food.  Make a grocery list and stick to it. Cooking  Try to cook your favorite foods in a healthier way. For example, try baking instead of frying.  Use low-fat dairy products. Meal planning  Use more fruits and vegetables. Half of your plate should be fruits   and vegetables.  Include lean proteins like poultry and fish. How do I count calories when eating out?  Ask for smaller portion sizes.  Consider sharing an entree and sides instead of getting your own entree.  If you get your own entree, eat only half. Ask for a box at the beginning of your meal and put the rest of your entree in it so you are not tempted to eat it.  If calories are listed on the menu, choose the lower calorie options.  Choose dishes that include vegetables, fruits, whole grains, low-fat dairy products, and lean protein.  Choose items that are boiled, broiled, grilled, or steamed. Stay away from items that are buttered, battered, fried, or served with cream sauce. Items labeled "crispy" are usually fried, unless stated otherwise.  Choose water, low-fat milk, unsweetened iced tea, or other drinks without added sugar. If you want an alcoholic beverage, choose a lower calorie option such as a glass of wine or light beer.  Ask for dressings, sauces, and syrups on the side. These are usually high in calories, so you should limit the amount you eat.  If you want a salad, choose a garden salad and ask for grilled meats. Avoid extra toppings like bacon, cheese, or fried items. Ask for the dressing on the side, or ask for olive oil and vinegar or lemon to use as dressing.  Estimate how many servings of a food you are given. For example, a serving of  cooked rice is  cup or about the size of half a baseball. Knowing serving sizes will help you be aware of how much food you are eating at restaurants. The list below tells you how big or small some common portion sizes are based on everyday objects: ? 1 oz-4 stacked dice. ? 3 oz-1 deck of cards. ? 1 tsp-1 die. ? 1 Tbsp- a ping-pong ball. ? 2 Tbsp-1 ping-pong ball. ?  cup- baseball. ? 1 cup-1 baseball. Summary  Calorie counting means keeping track of how many calories you eat and drink each day. If you eat fewer calories than your body needs, you should lose weight.  A healthy amount of weight to lose per week is usually 1-2 lb (0.5-0.9 kg). This usually means reducing your daily calorie intake by 500-750 calories.  The number of calories in a food can be found on a Nutrition Facts label. If a food does not have a Nutrition Facts label, try to look up the calories online or ask your dietitian for help.  Use your calories on foods and drinks that will fill you up, and not on foods and drinks that will leave you hungry.  Use smaller plates, glasses, and bowls to prevent overeating. This information is not intended to replace advice given to you by your health care provider. Make sure you discuss any questions you have with your health care provider. Document Released: 04/06/2005 Document Revised: 12/24/2017 Document Reviewed: 03/06/2016 Elsevier Patient Education  [place]. Diabetes Mellitus and Nutrition, Adult When you have diabetes (diabetes mellitus), it is very important to have healthy eating habits because your blood sugar (glucose) levels are greatly affected by what you eat and drink. Eating healthy foods in the appropriate amounts, at about the same times every day, can help you:  Control your blood glucose.  Lower your risk of heart disease.  Improve your blood pressure.  Reach or maintain a healthy weight. Every person with diabetes is different, and each person  has different needs  for a meal plan. Your health care provider may recommend that you work with a diet and nutrition specialist (dietitian) to make a meal plan that is best for you. Your meal plan may vary depending on factors such as:  The calories you need.  The medicines you take.  Your weight.  Your blood glucose, blood pressure, and cholesterol levels.  Your activity level.  Other health conditions you have, such as heart or kidney disease. How do carbohydrates affect me? Carbohydrates, also called carbs, affect your blood glucose level more than any other type of food. Eating carbs naturally raises the amount of glucose in your blood. Carb counting is a method for keeping track of how many carbs you eat. Counting carbs is important to keep your blood glucose at a healthy level, especially if you use insulin or take certain oral diabetes medicines. It is important to know how many carbs you can safely have in each meal. This is different for every person. Your dietitian can help you calculate how many carbs you should have at each meal and for each snack. Foods that contain carbs include:  Bread, cereal, rice, pasta, and crackers.  Potatoes and corn.  Peas, beans, and lentils.  Milk and yogurt.  Fruit and juice.  Desserts, such as cakes, cookies, ice cream, and candy. How does alcohol affect me? Alcohol can cause a sudden decrease in blood glucose (hypoglycemia), especially if you use insulin or take certain oral diabetes medicines. Hypoglycemia can be a life-threatening condition. Symptoms of hypoglycemia (sleepiness, dizziness, and confusion) are similar to symptoms of having too much alcohol. If your health care provider says that alcohol is safe for you, follow these guidelines:  Limit alcohol intake to no more than 1 drink per day for nonpregnant women and 2 drinks per day for men. One drink equals 12 oz of beer, 5 oz of wine, or 1 oz of hard liquor.  Do not drink on an  empty stomach.  Keep yourself hydrated with water, diet soda, or unsweetened iced tea.  Keep in mind that regular soda, juice, and other mixers may contain a lot of sugar and must be counted as carbs. What are tips for following this plan?  Reading food labels  Start by checking the serving size on the "Nutrition Facts" label of packaged foods and drinks. The amount of calories, carbs, fats, and other nutrients listed on the label is based on one serving of the item. Many items contain more than one serving per package.  Check the total grams (g) of carbs in one serving. You can calculate the number of servings of carbs in one serving by dividing the total carbs by 15. For example, if a food has 30 g of total carbs, it would be equal to 2 servings of carbs.  Check the number of grams (g) of saturated and trans fats in one serving. Choose foods that have low or no amount of these fats.  Check the number of milligrams (mg) of salt (sodium) in one serving. Most people should limit total sodium intake to less than 2,300 mg per day.  Always check the nutrition information of foods labeled as "low-fat" or "nonfat". These foods may be higher in added sugar or refined carbs and should be avoided.  Talk to your dietitian to identify your daily goals for nutrients listed on the label. Shopping  Avoid buying canned, premade, or processed foods. These foods tend to be high in fat, sodium, and added sugar.  Shop around the outside edge of the grocery store. This includes fresh fruits and vegetables, bulk grains, fresh meats, and fresh dairy. Cooking  Use low-heat cooking methods, such as baking, instead of high-heat cooking methods like deep frying.  Cook using healthy oils, such as olive, canola, or sunflower oil.  Avoid cooking with butter, cream, or high-fat meats. Meal planning  Eat meals and snacks regularly, preferably at the same times every day. Avoid going long periods of time without  eating.  Eat foods high in fiber, such as fresh fruits, vegetables, beans, and whole grains. Talk to your dietitian about how many servings of carbs you can eat at each meal.  Eat 4-6 ounces (oz) of lean protein each day, such as lean meat, chicken, fish, eggs, or tofu. One oz of lean protein is equal to: ? 1 oz of meat, chicken, or fish. ? 1 egg. ?  cup of tofu.  Eat some foods each day that contain healthy fats, such as avocado, nuts, seeds, and fish. Lifestyle  Check your blood glucose regularly.  Exercise regularly as told by your health care provider. This may include: ? 150 minutes of moderate-intensity or vigorous-intensity exercise each week. This could be brisk walking, biking, or water aerobics. ? Stretching and doing strength exercises, such as yoga or weightlifting, at least 2 times a week.  Take medicines as told by your health care provider.  Do not use any products that contain nicotine or tobacco, such as cigarettes and e-cigarettes. If you need help quitting, ask your health care provider.  Work with a Social worker or diabetes educator to identify strategies to manage stress and any emotional and social challenges. Questions to ask a health care provider  Do I need to meet with a diabetes educator?  Do I need to meet with a dietitian?  What number can I call if I have questions?  When are the best times to check my blood glucose? Where to find more information:  American Diabetes Association: diabetes.org  Academy of Nutrition and Dietetics: www.eatright.[employer] of Diabetes and Digestive and Kidney Diseases (NIH): [employer] Summary  A healthy meal plan will help you control your blood glucose and maintain a healthy lifestyle.  Working with a diet and nutrition specialist (dietitian) can help you make a meal plan that is best for you.  Keep in mind that carbohydrates (carbs) and alcohol have immediate effects on your blood glucose  levels. It is important to count carbs and to use alcohol carefully. This information is not intended to replace advice given to you by your health care provider. Make sure you discuss any questions you have with your health care provider. Document Released: 01/01/2005 Document Revised: 03/19/2017 Document Reviewed: 05/11/2016 Elsevier Patient Education  2020 [employer].

## 2018-11-15 LAB — LIPID PANEL
Chol/HDL Ratio: 3.3 ratio (ref 0.0–4.4)
Cholesterol, Total: 166 mg/dL (ref 100–199)
HDL: 51 mg/dL (ref >39)
LDL Calculated: 97 mg/dL (ref 0–99)
Triglycerides: 88 mg/dL (ref 0–149)
VLDL Cholesterol Cal: 18 mg/dL (ref 5–40)

## 2018-11-15 LAB — CMP14+EGFR
ALT: 25 IU/L (ref 0–32)
AST: 19 IU/L (ref 0–40)
Albumin/Globulin Ratio: 2 (ref 1.2–2.2)
Albumin: 4.3 g/dL (ref 3.8–4.8)
Alkaline Phosphatase: 65 IU/L (ref 39–117)
BUN/Creatinine Ratio: 15 (ref 9–23)
BUN: 12 mg/dL (ref 6–24)
Bilirubin Total: 0.3 mg/dL (ref 0.0–1.2)
CO2: 27 mmol/L (ref 20–29)
Calcium: 9.3 mg/dL (ref 8.7–10.2)
Chloride: 101 mmol/L (ref 96–106)
Creatinine, Ser: 0.81 mg/dL (ref 0.57–1.00)
GFR calc Af Amer: 99 mL/min/{1.73_m2} (ref >59)
GFR calc non Af Amer: 86 mL/min/{1.73_m2} (ref >59)
Globulin, Total: 2.1 g/dL (ref 1.5–4.5)
Glucose: 113 mg/dL — ABNORMAL HIGH (ref 65–99)
Potassium: 4.2 mmol/L (ref 3.5–5.2)
Sodium: 142 mmol/L (ref 134–144)
Total Protein: 6.4 g/dL (ref 6.0–8.5)

## 2018-11-15 LAB — CBC WITH DIFFERENTIAL/PLATELET
Basophils Absolute: 0.1 10*3/uL (ref 0.0–0.2)
Basos: 1 %
EOS (ABSOLUTE): 0.4 10*3/uL (ref 0.0–0.4)
Eos: 5 %
Hematocrit: 39.2 % (ref 34.0–46.6)
Hemoglobin: 13.2 g/dL (ref 11.1–15.9)
Immature Grans (Abs): 0 10*3/uL (ref 0.0–0.1)
Immature Granulocytes: 0 %
Lymphocytes Absolute: 4 10*3/uL — ABNORMAL HIGH (ref 0.7–3.1)
Lymphs: 46 %
MCH: 30.1 pg (ref 26.6–33.0)
MCHC: 33.7 g/dL (ref 31.5–35.7)
MCV: 90 fL (ref 79–97)
Monocytes Absolute: 0.7 10*3/uL (ref 0.1–0.9)
Monocytes: 9 %
Neutrophils Absolute: 3.3 10*3/uL (ref 1.4–7.0)
Neutrophils: 39 %
Platelets: 245 10*3/uL (ref 150–450)
RBC: 4.38 x10E6/uL (ref 3.77–5.28)
RDW: 12.4 % (ref 11.7–15.4)
WBC: 8.6 10*3/uL (ref 3.4–10.8)

## 2018-11-25 ENCOUNTER — Ambulatory Visit: Payer: 59 | Admitting: Podiatry

## 2018-12-29 ENCOUNTER — Encounter: Payer: Self-pay | Admitting: Primary Care

## 2018-12-29 DIAGNOSIS — H524 Presbyopia: Secondary | ICD-10-CM | POA: Diagnosis not present

## 2018-12-29 DIAGNOSIS — H35033 Hypertensive retinopathy, bilateral: Secondary | ICD-10-CM | POA: Diagnosis not present

## 2018-12-29 DIAGNOSIS — H0288A Meibomian gland dysfunction right eye, upper and lower eyelids: Secondary | ICD-10-CM | POA: Diagnosis not present

## 2018-12-29 DIAGNOSIS — H52223 Regular astigmatism, bilateral: Secondary | ICD-10-CM | POA: Diagnosis not present

## 2018-12-29 DIAGNOSIS — H5213 Myopia, bilateral: Secondary | ICD-10-CM | POA: Diagnosis not present

## 2018-12-29 DIAGNOSIS — H1045 Other chronic allergic conjunctivitis: Secondary | ICD-10-CM | POA: Diagnosis not present

## 2018-12-29 DIAGNOSIS — H04123 Dry eye syndrome of bilateral lacrimal glands: Secondary | ICD-10-CM | POA: Diagnosis not present

## 2018-12-29 DIAGNOSIS — E119 Type 2 diabetes mellitus without complications: Secondary | ICD-10-CM | POA: Diagnosis not present

## 2018-12-29 DIAGNOSIS — H0288B Meibomian gland dysfunction left eye, upper and lower eyelids: Secondary | ICD-10-CM | POA: Diagnosis not present

## 2018-12-29 LAB — HM DIABETES EYE EXAM

## 2018-12-30 ENCOUNTER — Ambulatory Visit: Payer: 59 | Admitting: Podiatry

## 2019-01-10 ENCOUNTER — Encounter: Payer: Self-pay | Admitting: Gynecology

## 2019-01-21 ENCOUNTER — Other Ambulatory Visit (INDEPENDENT_AMBULATORY_CARE_PROVIDER_SITE_OTHER): Payer: Self-pay | Admitting: Primary Care

## 2019-01-21 DIAGNOSIS — Z760 Encounter for issue of repeat prescription: Secondary | ICD-10-CM

## 2019-01-25 ENCOUNTER — Telehealth (INDEPENDENT_AMBULATORY_CARE_PROVIDER_SITE_OTHER): Payer: Self-pay

## 2019-01-25 ENCOUNTER — Other Ambulatory Visit: Payer: Self-pay | Admitting: Primary Care

## 2019-01-25 ENCOUNTER — Other Ambulatory Visit (INDEPENDENT_AMBULATORY_CARE_PROVIDER_SITE_OTHER): Payer: Self-pay | Admitting: Primary Care

## 2019-01-25 DIAGNOSIS — Z1231 Encounter for screening mammogram for malignant neoplasm of breast: Secondary | ICD-10-CM

## 2019-01-25 NOTE — Telephone Encounter (Signed)
Sent to PCP. Tempestt S Roberts, CMA  

## 2019-01-25 NOTE — Telephone Encounter (Signed)
Patient called to schedule an appointment to get her mammograph done but was stating to the front office that her left breast has been itching for the past month. [facility] advice the patient to contact her PCP to send in orders for a diagnostic ultrasound mammograph of the left breast.   Please advice [phone]  Thank you [name]

## 2019-01-30 ENCOUNTER — Other Ambulatory Visit (INDEPENDENT_AMBULATORY_CARE_PROVIDER_SITE_OTHER): Payer: Self-pay | Admitting: Primary Care

## 2019-01-30 DIAGNOSIS — Z1231 Encounter for screening mammogram for malignant neoplasm of breast: Secondary | ICD-10-CM

## 2019-02-03 ENCOUNTER — Other Ambulatory Visit: Payer: 59

## 2019-03-01 ENCOUNTER — Other Ambulatory Visit (INDEPENDENT_AMBULATORY_CARE_PROVIDER_SITE_OTHER): Payer: Self-pay | Admitting: Primary Care

## 2019-03-01 DIAGNOSIS — N644 Mastodynia: Secondary | ICD-10-CM

## 2019-03-14 ENCOUNTER — Ambulatory Visit
Admission: RE | Admit: 2019-03-14 | Discharge: 2019-03-14 | Disposition: A | Discharge status: Home / Self Care | Payer: 59 | Source: Ambulatory Visit | Attending: Primary Care | Admitting: Primary Care

## 2019-03-14 ENCOUNTER — Ambulatory Visit: Payer: 59

## 2019-03-14 ENCOUNTER — Other Ambulatory Visit: Payer: Self-pay

## 2019-03-14 DIAGNOSIS — N644 Mastodynia: Secondary | ICD-10-CM

## 2019-03-14 DIAGNOSIS — R928 Other abnormal and inconclusive findings on diagnostic imaging of breast: Secondary | ICD-10-CM | POA: Diagnosis not present

## 2019-05-10 DIAGNOSIS — J069 Acute upper respiratory infection, unspecified: Secondary | ICD-10-CM | POA: Diagnosis not present

## 2019-05-17 ENCOUNTER — Ambulatory Visit (INDEPENDENT_AMBULATORY_CARE_PROVIDER_SITE_OTHER): Payer: 59 | Admitting: Primary Care

## 2019-05-18 ENCOUNTER — Telehealth (INDEPENDENT_AMBULATORY_CARE_PROVIDER_SITE_OTHER): Payer: 59 | Admitting: Primary Care

## 2019-05-18 DIAGNOSIS — F411 Generalized anxiety disorder: Secondary | ICD-10-CM

## 2019-05-18 DIAGNOSIS — I10 Essential (primary) hypertension: Secondary | ICD-10-CM | POA: Diagnosis not present

## 2019-05-18 DIAGNOSIS — F329 Major depressive disorder, single episode, unspecified: Secondary | ICD-10-CM | POA: Diagnosis not present

## 2019-05-18 DIAGNOSIS — Z760 Encounter for issue of repeat prescription: Secondary | ICD-10-CM

## 2019-05-18 DIAGNOSIS — R69 Illness, unspecified: Secondary | ICD-10-CM | POA: Diagnosis not present

## 2019-05-18 DIAGNOSIS — Z720 Tobacco use: Secondary | ICD-10-CM

## 2019-05-18 DIAGNOSIS — F32A Depression, unspecified: Secondary | ICD-10-CM

## 2019-05-18 MED ORDER — HYDROCHLOROTHIAZIDE 25 MG PO TABS: ORAL_TABLET | ORAL | 1 refills | Status: DC

## 2019-05-18 MED ORDER — BUSPIRONE HCL 7.5 MG PO TABS: 7.5000 mg | ORAL_TABLET | Freq: Three times a day (TID) | ORAL | 1 refills | Status: DC

## 2019-05-18 MED ORDER — GLIMEPIRIDE 2 MG PO TABS: 2.0000 mg | ORAL_TABLET | Freq: Every day | ORAL | 1 refills | Status: DC

## 2019-05-18 MED ORDER — AMLODIPINE BESYLATE 5 MG PO TABS: 5.0000 mg | ORAL_TABLET | Freq: Every day | ORAL | 1 refills | Status: DC

## 2019-05-18 MED ORDER — ALBUTEROL SULFATE HFA 108 (90 BASE) MCG/ACT IN AERS: 1.0000 | INHALATION_SPRAY | Freq: Four times a day (QID) | RESPIRATORY_TRACT | 1 refills | Status: DC | PRN

## 2019-05-18 MED ORDER — LOSARTAN POTASSIUM 50 MG PO TABS: 100.0000 mg | ORAL_TABLET | Freq: Every day | ORAL | 1 refills | Status: DC

## 2019-05-18 MED ORDER — ESCITALOPRAM OXALATE 20 MG PO TABS: 20.0000 mg | ORAL_TABLET | Freq: Every day | ORAL | 1 refills | Status: DC

## 2019-05-18 MED ORDER — PRAVASTATIN SODIUM 40 MG PO TABS: 40.0000 mg | ORAL_TABLET | Freq: Every day | ORAL | 1 refills | Status: DC

## 2019-05-18 NOTE — Progress Notes (Signed)
Virtual Visit via Telephone Note  I connected with Catherine [name] on 05/18/19 at  8:30 AM EST by telephone and verified that I am speaking with the correct person using two identifiers.   I discussed the limitations, risks, security and privacy concerns of performing an evaluation and management service by telephone and the availability of in person appointments. I also discussed with the patient that there may be a patient responsible charge related to this service. The patient expressed understanding and agreed to proceed.   History of Present Illness: Catherine [name] is having a web visit today for concerns with increased anxiety and depression and also describing panic attacks.  She stated that things have not contacts or increase in anxiety wakes her up from her sleep.  Interruption of sleep is causing increased fatigue. Past Medical History:  Diagnosis Date  . Anxiety   . Bronchitis   . Depression   . Hypertension   . Sleep apnea   Medications include HCTZ 25 mg daily Losartan 100 mg daily Albuterol HFA as needed Amaryl 2 mg after breakfast Lexapro 20 mg nightly  pravastatin 40 mg nightly Amlodipine 5 mg daily BuSpar 5 mg twice daily  Observations/Objective: Review of Systems  Constitutional: Positive for malaise/fatigue.  Respiratory: Positive for cough and sputum production.   Psychiatric/Behavioral: Positive for depression. The patient is nervous/anxious and has insomnia.   All other systems reviewed and are negative.   Assessment and Plan: Diagnoses and all orders for this visit:  Depression, unspecified depression type Lexapro 20 mg at night previously was helping with depression and insomnia with the increase episodes of anxiety and panic attacks not as effective.  Will not change current medication for depression will follow up with clinical social worker to discuss and the need for counseling.  Patient feels this is also interfering with her job loss and lack of  interest and being at home.  Tobacco abuse Can be a contributing factor to respiratory infections or disease, discussed sensation cough for bronchospasms-rescue inhaler prescribed and you may purchase over-the-counter medication for cough and not other symptoms listed that is not present.  Medication refill -     albuterol (VENTOLIN HFA) 108 (90 Base) MCG/ACT inhaler; Inhale 1-2 puffs into the lungs every 6 (six) hours as needed for wheezing or shortness of breath. -     hydrochlorothiazide (HYDRODIURIL) 25 MG tablet; TAKE 1 TABLET BY MOUTH DAILY. TAKE ON TABLET IN THE MORNING. -     losartan (COZAAR) 50 MG tablet; Take 2 tablets (100 mg total) by mouth daily. -     glimepiride (AMARYL) 2 MG tablet; Take 1 tablet (2 mg total) by mouth daily before breakfast. -     escitalopram (LEXAPRO) 20 MG tablet; Take 1 tablet (20 mg total) by mouth daily. -     pravastatin (PRAVACHOL) 40 MG tablet; Take 1 tablet (40 mg total) by mouth daily.  Essential hypertension Admits to not taking blood pressure consistently discuss target goal less than or equal to 130/80 reduction in low-sodium intake and vigorous exercising 30 minutes daily 150 minutes weekly refill amlodipine. -     amLODipine (NORVASC) 5 MG tablet; Take 1 tablet (5 mg total) by mouth daily.  Generalized anxiety disorder Patient is known and focus is anxiety and panic attacks currently on BuSpar 5 mg twice daily will increase BuSpar to 7.5 mg 3 times a day reevaluate effectiveness in 4 weeks also following up with clinical Education officer, [employer]. -  busPIRone (BUSPAR) 7.5 MG tablet; Take 1 tablet (7.5 mg total) by mouth 3 (three) times daily.     Follow Up Instructions:    I discussed the assessment and treatment plan with the patient. The patient was provided an opportunity to ask questions and all were answered. The patient agreed with the plan and demonstrated an understanding of the instructions.   The patient was advised to call back or seek  an in-person evaluation if the symptoms worsen or if the condition fails to improve as anticipated.  I provided 15 minutes of non-face-to-face time during this encounter.   Kerin Perna, NP

## 2019-09-18 ENCOUNTER — Other Ambulatory Visit (INDEPENDENT_AMBULATORY_CARE_PROVIDER_SITE_OTHER): Payer: Self-pay | Admitting: Primary Care

## 2019-09-18 DIAGNOSIS — Z760 Encounter for issue of repeat prescription: Secondary | ICD-10-CM

## 2019-09-19 NOTE — Telephone Encounter (Signed)
Sent to PCP ?

## 2019-09-27 ENCOUNTER — Other Ambulatory Visit: Payer: Self-pay

## 2019-09-27 ENCOUNTER — Encounter (HOSPITAL_COMMUNITY): Payer: Self-pay | Admitting: Emergency Medicine

## 2019-09-27 ENCOUNTER — Emergency Department (HOSPITAL_COMMUNITY)
Admission: EM | Admit: 2019-09-27 | Discharge: 2019-09-27 | Disposition: A | Discharge status: Home / Self Care | Payer: No Typology Code available for payment source | Attending: Emergency Medicine | Admitting: Emergency Medicine

## 2019-09-27 DIAGNOSIS — R0602 Shortness of breath: Secondary | ICD-10-CM | POA: Insufficient documentation

## 2019-09-27 DIAGNOSIS — R05 Cough: Secondary | ICD-10-CM | POA: Insufficient documentation

## 2019-09-27 DIAGNOSIS — Z20822 Contact with and (suspected) exposure to covid-19: Secondary | ICD-10-CM | POA: Insufficient documentation

## 2019-09-27 DIAGNOSIS — Z5321 Procedure and treatment not carried out due to patient leaving prior to being seen by health care provider: Secondary | ICD-10-CM | POA: Insufficient documentation

## 2019-09-27 LAB — CBC
HCT: 42.9 % (ref 36.0–46.0)
Hemoglobin: 13.4 g/dL (ref 12.0–15.0)
MCH: 30.4 pg (ref 26.0–34.0)
MCHC: 31.2 g/dL (ref 30.0–36.0)
MCV: 97.3 fL (ref 80.0–100.0)
Platelets: 243 10*3/uL (ref 150–400)
RBC: 4.41 MIL/uL (ref 3.87–5.11)
RDW: 14.3 % (ref 11.5–15.5)
WBC: 10.3 10*3/uL (ref 4.0–10.5)
nRBC: 0 % (ref 0.0–0.2)

## 2019-09-27 LAB — BASIC METABOLIC PANEL
Anion gap: 10 (ref 5–15)
BUN: 14 mg/dL (ref 6–20)
CO2: 27 mmol/L (ref 22–32)
Calcium: 9.1 mg/dL (ref 8.9–10.3)
Chloride: 102 mmol/L (ref 98–111)
Creatinine, Ser: 0.86 mg/dL (ref 0.44–1.00)
GFR calc Af Amer: >60 mL/min (ref >60)
GFR calc non Af Amer: >60 mL/min (ref >60)
Glucose, Bld: 115 mg/dL — ABNORMAL HIGH (ref 70–99)
Potassium: 4 mmol/L (ref 3.5–5.1)
Sodium: 139 mmol/L (ref 135–145)

## 2019-09-27 LAB — I-STAT BETA HCG BLOOD, ED (MC, WL, AP ONLY): I-stat hCG, quantitative: <5 m[IU]/mL (ref <5)

## 2019-09-27 LAB — TROPONIN I (HIGH SENSITIVITY): Troponin I (High Sensitivity): 5 ng/L (ref <18)

## 2019-09-27 LAB — SARS CORONAVIRUS 2 BY RT PCR (HOSPITAL ORDER, PERFORMED IN ~~LOC~~ HOSPITAL LAB): SARS Coronavirus 2: NEGATIVE

## 2019-09-27 MED ORDER — SODIUM CHLORIDE 0.9% FLUSH: 3.0000 mL | Freq: Once | INTRAVENOUS | Status: DC

## 2019-09-27 NOTE — ED Notes (Signed)
Pt stated that she was leaving because she was tired and hungry but that she would for sure follow up with her PCP

## 2019-09-27 NOTE — ED Triage Notes (Signed)
Pt reports SOB starting tonight, cough started last Wednesday.  Several children in the home are ill.

## 2019-10-21 DIAGNOSIS — R05 Cough: Secondary | ICD-10-CM | POA: Diagnosis not present

## 2019-10-22 DIAGNOSIS — J209 Acute bronchitis, unspecified: Secondary | ICD-10-CM | POA: Diagnosis not present

## 2019-12-12 DIAGNOSIS — Z20828 Contact with and (suspected) exposure to other viral communicable diseases: Secondary | ICD-10-CM | POA: Diagnosis not present

## 2020-01-02 DIAGNOSIS — Z20828 Contact with and (suspected) exposure to other viral communicable diseases: Secondary | ICD-10-CM | POA: Diagnosis not present

## 2020-01-07 ENCOUNTER — Other Ambulatory Visit (INDEPENDENT_AMBULATORY_CARE_PROVIDER_SITE_OTHER): Payer: Self-pay | Admitting: Primary Care

## 2020-01-07 DIAGNOSIS — I10 Essential (primary) hypertension: Secondary | ICD-10-CM

## 2020-01-07 NOTE — Telephone Encounter (Signed)
Requested Prescriptions  Pending Prescriptions Disp Refills  . amLODipine (NORVASC) 5 MG tablet [Pharmacy Med Name: AMLODIPINE BESYLATE 5 MG TAB] 30 tablet 0    Sig: TAKE 1 TABLET BY MOUTH EVERY DAY     Cardiovascular:  Calcium Channel Blockers Failed - 01/07/2020 12:09 AM      Failed - Valid encounter within last 6 months    Recent Outpatient Visits          7 months ago Depression, unspecified depression type   [place] Kerin Perna, NP   1 year ago Essential hypertension   [place], Michelle P, NP   1 year ago Class 3 severe obesity with serious comorbidity in adult, unspecified BMI, unspecified obesity type [facility])   [place], [place] Cage, NP   1 year ago Essential hypertension   [place] Kerin Perna, NP   1 year ago Generalized anxiety disorder   [place] Ladell Pier, MD             Passed - Last BP in normal range    BP Readings from Last 1 Encounters:  09/27/19 134/88         One month courtesy refill provided with reminder for patient to schedule an appointment for 6 month follow-up.

## 2020-01-21 ENCOUNTER — Other Ambulatory Visit (INDEPENDENT_AMBULATORY_CARE_PROVIDER_SITE_OTHER): Payer: Self-pay | Admitting: Primary Care

## 2020-01-21 DIAGNOSIS — I10 Essential (primary) hypertension: Secondary | ICD-10-CM

## 2020-01-21 NOTE — Telephone Encounter (Signed)
Requested medications are due for refill today?  Yes   Requested medications are on active medication list?  Yes  Last Refill:   01/07/2020 # 30 with no refills.   This was a courtesy refill with a reminder for patient to schedule an appointment for an office visit.    Future visit scheduled?  No   Notes to Clinic:  Medication failed Rx Refill protocol due to no valid encounter in the past 6 months.

## 2020-01-23 NOTE — Telephone Encounter (Signed)
Needs appt

## 2020-01-25 DIAGNOSIS — U071 COVID-19: Secondary | ICD-10-CM | POA: Diagnosis not present

## 2020-01-25 DIAGNOSIS — J069 Acute upper respiratory infection, unspecified: Secondary | ICD-10-CM | POA: Diagnosis not present

## 2020-01-26 DIAGNOSIS — Z20822 Contact with and (suspected) exposure to covid-19: Secondary | ICD-10-CM | POA: Diagnosis not present

## 2020-01-30 ENCOUNTER — Other Ambulatory Visit: Payer: Self-pay | Admitting: Primary Care

## 2020-01-30 DIAGNOSIS — Z1231 Encounter for screening mammogram for malignant neoplasm of breast: Secondary | ICD-10-CM

## 2020-02-14 ENCOUNTER — Other Ambulatory Visit (INDEPENDENT_AMBULATORY_CARE_PROVIDER_SITE_OTHER): Payer: Self-pay | Admitting: Primary Care

## 2020-02-14 DIAGNOSIS — Z760 Encounter for issue of repeat prescription: Secondary | ICD-10-CM

## 2020-02-14 NOTE — Telephone Encounter (Signed)
Appointment made for 02/21/20.

## 2020-02-21 ENCOUNTER — Ambulatory Visit (INDEPENDENT_AMBULATORY_CARE_PROVIDER_SITE_OTHER): Payer: No Typology Code available for payment source | Admitting: Primary Care

## 2020-02-21 ENCOUNTER — Encounter (INDEPENDENT_AMBULATORY_CARE_PROVIDER_SITE_OTHER): Payer: Self-pay | Admitting: Primary Care

## 2020-02-21 ENCOUNTER — Other Ambulatory Visit: Payer: Self-pay

## 2020-02-21 VITALS — BP 133/89 | HR 85 | Temp 97.3°F | Ht 67.0 in | Wt 301.6 lb

## 2020-02-21 DIAGNOSIS — L600 Ingrowing nail: Secondary | ICD-10-CM

## 2020-02-21 DIAGNOSIS — F32A Depression, unspecified: Secondary | ICD-10-CM | POA: Diagnosis not present

## 2020-02-21 DIAGNOSIS — R69 Illness, unspecified: Secondary | ICD-10-CM | POA: Diagnosis not present

## 2020-02-21 DIAGNOSIS — E119 Type 2 diabetes mellitus without complications: Secondary | ICD-10-CM

## 2020-02-21 DIAGNOSIS — M545 Low back pain, unspecified: Secondary | ICD-10-CM

## 2020-02-21 DIAGNOSIS — G8929 Other chronic pain: Secondary | ICD-10-CM

## 2020-02-21 DIAGNOSIS — G4700 Insomnia, unspecified: Secondary | ICD-10-CM | POA: Diagnosis not present

## 2020-02-21 DIAGNOSIS — I10 Essential (primary) hypertension: Secondary | ICD-10-CM | POA: Diagnosis not present

## 2020-02-21 LAB — POCT GLYCOSYLATED HEMOGLOBIN (HGB A1C): Hemoglobin A1C: 6.8 % — AB (ref 4.0–5.6)

## 2020-02-21 LAB — GLUCOSE, POCT (MANUAL RESULT ENTRY): POC Glucose: 103 mg/dl — AB (ref 70–99)

## 2020-02-21 MED ORDER — BUPROPION HCL ER (XL) 150 MG PO TB24: 150.0000 mg | ORAL_TABLET | Freq: Every day | ORAL | 1 refills | Status: DC

## 2020-02-21 NOTE — Progress Notes (Addendum)
Established Patient Office Visit  Subjective:  Patient ID: Catherine [name], female    DOB: 30-Oct-1970  Age: 49 y.o. MRN: [id number]  CC:  Chief Complaint  Patient presents with  . Hypertension  . Diabetes    HPI Catherine [name] is a 49 year old morbid obese female who  presents for management of hypertension and diabetes . She admits to not taking her medications on a regular bases. She is overwhelm crying with psychosocial problems. She complains of back pain shoots down leg.  Past Medical History:  Diagnosis Date  . Anxiety   . Bronchitis   . Depression   . Hypertension   . Sleep apnea     Past Surgical History:  Procedure Laterality Date  . BUNIONECTOMY  2012  . CESAREAN SECTION     X 2  . LAVH  2009   Leiomyoma and menorrhagia  . MYOMECTOMY  2005    Family History  Problem Relation Age of Onset  . Hypertension Mother   . Asthma Mother   . Hypertension Father   . Heart attack Father   . Hypertension Sister   . Asthma Daughter     Social History   Socioeconomic History  . Marital status: Single    Spouse name: Not on file  . Number of children: Not on file  . Years of education: Not on file  . Highest education level: Not on file  Occupational History  . Not on file  Tobacco Use  . Smoking status: Current Every Day Smoker    Packs/day: 0.50    Types: Cigarettes  . Smokeless tobacco: Never Used  Substance and Sexual Activity  . Alcohol use: Yes    Alcohol/week: 0.0 standard drinks    Comment: socially  . Drug use: No  . Sexual activity: Yes    Birth control/protection: Surgical    Comment: 1st intercourse 50 yo-More than 5 partners-HYST  Other Topics Concern  . Not on file  Social History Narrative  . Not on file   Social Determinants of Health   Financial Resource Strain:   . Difficulty of Paying Living Expenses: Not on file  Food Insecurity:   . Worried About Charity fundraiser in the Last Year: Not on file  . Ran Out of Food in the  Last Year: Not on file  Transportation Needs:   . Lack of Transportation (Medical): Not on file  . Lack of Transportation (Non-Medical): Not on file  Physical Activity:   . Days of Exercise per Week: Not on file  . Minutes of Exercise per Session: Not on file  Stress:   . Feeling of Stress : Not on file  Social Connections:   . Frequency of Communication with Friends and Family: Not on file  . Frequency of Social Gatherings with Friends and Family: Not on file  . Attends Religious Services: Not on file  . Active Member of Clubs or Organizations: Not on file  . Attends Archivist Meetings: Not on file  . Marital Status: Not on file  Intimate Partner Violence:   . Fear of Current or Ex-Partner: Not on file  . Emotionally Abused: Not on file  . Physically Abused: Not on file  . Sexually Abused: Not on file    Outpatient Medications Prior to Visit  Medication Sig Dispense Refill  . amLODipine (NORVASC) 5 MG tablet TAKE 1 TABLET BY MOUTH EVERY DAY 30 tablet 0  . busPIRone (BUSPAR) 7.5 MG  tablet Take 1 tablet (7.5 mg total) by mouth 3 (three) times daily. 270 tablet 1  . hydrochlorothiazide (HYDRODIURIL) 25 MG tablet TAKE 1 TABLET BY MOUTH DAILY. TAKE ON TABLET IN THE MORNING. 90 tablet 1  . losartan (COZAAR) 50 MG tablet TAKE 2 TABLETS BY MOUTH EVERY DAY 180 tablet 0  . pravastatin (PRAVACHOL) 40 MG tablet Take 1 tablet (40 mg total) by mouth daily. 90 tablet 1  . albuterol (VENTOLIN HFA) 108 (90 Base) MCG/ACT inhaler INHALE 1-2 PUFFS INTO THE LUNGS EVERY 6 (SIX) HOURS AS NEEDED FOR WHEEZING OR SHORTNESS OF BREATH. 18 g 1  . escitalopram (LEXAPRO) 20 MG tablet Take 1 tablet (20 mg total) by mouth daily. (Patient not taking: Reported on 02/21/2020) 90 tablet 1  . fluticasone (FLONASE) 50 MCG/ACT nasal spray Place into both nostrils.    Marland Kitchen glimepiride (AMARYL) 2 MG tablet Take 1 tablet (2 mg total) by mouth daily before breakfast. 90 tablet 1   No facility-administered medications  prior to visit.    Allergies  Allergen Reactions  . Metformin And Related Other (See Comments)    Abdominal discomfort.  . Latex Rash    ROS Review of Systems  Gastrointestinal: Positive for abdominal distention.  Endocrine: Positive for polyphagia.  Musculoskeletal: Positive for back pain.  Psychiatric/Behavioral: The patient is nervous/anxious.        Depression  All other systems reviewed and are negative.     Objective:    Physical Exam Vitals reviewed.  Constitutional:      Appearance: She is obese.  HENT:     Head: Normocephalic.     Right Ear: Tympanic membrane normal.     Left Ear: Tympanic membrane normal.  Eyes:     Extraocular Movements: Extraocular movements intact.     Pupils: Pupils are equal, round, and reactive to light.  Cardiovascular:     Rate and Rhythm: Normal rate and regular rhythm.  Pulmonary:     Effort: Pulmonary effort is normal.     Breath sounds: Normal breath sounds.  Abdominal:     General: Bowel sounds are normal. There is distension.     Palpations: Abdomen is soft.  Musculoskeletal:        General: Normal range of motion.     Cervical back: Normal range of motion and neck supple.  Skin:    General: Skin is warm and dry.  Neurological:     Mental Status: She is alert and oriented to person, place, and time.  Psychiatric:        Mood and Affect: Mood normal.        Behavior: Behavior normal.        Thought Content: Thought content normal.        Judgment: Judgment normal.     BP 133/89 (BP Location: Right Arm, Patient Position: Sitting, Cuff Size: Large)   Pulse 85   Temp (!) 97.3 F (36.3 C) (Temporal)   Ht 5\' 7"  (1.702 m)   Wt (!) 301 lb 9.6 oz (136.8 kg)   SpO2 94%   BMI 47.24 kg/m  Wt Readings from Last 3 Encounters:  02/21/20 (!) 301 lb 9.6 oz (136.8 kg)  09/27/19 (!) 303 lb 9.2 oz (137.7 kg)  11/14/18 (!) 301 lb 6.4 oz (136.7 kg)     Health Maintenance Due  Topic Date Due  . Hepatitis C Screening  Never  done  . PAP SMEAR-Modifier  05/16/2014  . OPHTHALMOLOGY EXAM  12/29/2019    There  are no preventive care reminders to display for this patient.  Lab Results  Component Value Date   TSH 2.370 06/22/2016   Lab Results  Component Value Date   WBC 10.3 09/27/2019   HGB 13.4 09/27/2019   HCT 42.9 09/27/2019   MCV 97.3 09/27/2019   PLT 243 09/27/2019   Lab Results  Component Value Date   NA 139 09/27/2019   K 4.0 09/27/2019   CO2 27 09/27/2019   GLUCOSE 115 (H) 09/27/2019   BUN 14 09/27/2019   CREATININE 0.86 09/27/2019   BILITOT 0.3 11/14/2018   ALKPHOS 65 11/14/2018   AST 19 11/14/2018   ALT 25 11/14/2018   PROT 6.4 11/14/2018   ALBUMIN 4.3 11/14/2018   CALCIUM 9.1 09/27/2019   ANIONGAP 10 09/27/2019   Lab Results  Component Value Date   CHOL 166 11/14/2018   Lab Results  Component Value Date   HDL 51 11/14/2018   Lab Results  Component Value Date   LDLCALC 97 11/14/2018   Lab Results  Component Value Date   TRIG 88 11/14/2018   Lab Results  Component Value Date   CHOLHDL 3.3 11/14/2018   Lab Results  Component Value Date   HGBA1C 6.8 (A) 02/21/2020      Assessment & Plan:  Tera was seen today for hypertension and diabetes.  Diagnoses and all orders for this visit:  Type 2 diabetes mellitus without complication, without long-term current use of insulin (HCC) Goal of therapy: Less than 6.5 hemoglobin A1c.  Decrease foods that are high in carbohydrates are the following rice, potatoes, breads, sugars, and pastas.  Reduction in the intake (eating) will assist in lowering your blood sugars. -     HgB A1c 6.8 -     Glucose (CBG)  Depression, unspecified depression type She did not feel lexapro was affective , currently having a lot of psychosocial problems and    Office Visit from 02/21/2020 in [place]  PHQ-9 Total Score 14    start on wellbutrin and f/u 6-8 for effectiveness  -     buPROPion (WELLBUTRIN XL) 150 MG 24 hr  tablet; Take 1 tablet (150 mg total) by mouth daily.  Onychocryptosis Podiatry referral  Insomnia, unspecified type Currently on Buspar for  Anxiety as needed added -     buPROPion (WELLBUTRIN XL) 150 MG 24 hr tablet; Take 1 tablet (150 mg total) by mouth daily. aif sleep is not obtain with these medication taken at bedtime on f/u will discuss melatonin   Essential hypertension Counseled on blood pressure goal of less than 130/80, low-sodium, DASH diet, medication compliance, 150 minutes of moderate intensity exercise per week. Continue  Cozaar 5omg daily  Chronic bilateral low back pain, unspecified whether sciatica present Refer to ortho per patient request   Follow-up: Return in about 6 weeks (around 04/03/2020).    Kerin Perna, NP

## 2020-02-23 ENCOUNTER — Telehealth: Payer: Self-pay | Admitting: Primary Care

## 2020-02-23 NOTE — Telephone Encounter (Signed)
Copied from [place] [id number]. Topic: Referral - Request for Referral >> Feb 23, 2020 10:42 AM Lennox Solders wrote: Has patient seen PCP for this complaint? Yes. Pt is having hip pain and seen michelle on 02-21-2020. Pt would like a referral to orthopaedic or chiropractor. Pt has [employer]

## 2020-02-23 NOTE — Addendum Note (Signed)
Addended by: Kerin Perna on: 02/23/2020 03:46 PM   Modules accepted: Orders

## 2020-02-25 ENCOUNTER — Other Ambulatory Visit (INDEPENDENT_AMBULATORY_CARE_PROVIDER_SITE_OTHER): Payer: Self-pay | Admitting: Primary Care

## 2020-02-25 DIAGNOSIS — Z760 Encounter for issue of repeat prescription: Secondary | ICD-10-CM

## 2020-02-26 ENCOUNTER — Emergency Department (HOSPITAL_COMMUNITY)
Admission: EM | Admit: 2020-02-26 | Discharge: 2020-02-27 | Disposition: A | Discharge status: Home / Self Care | Payer: No Typology Code available for payment source | Attending: Emergency Medicine | Admitting: Emergency Medicine

## 2020-02-26 ENCOUNTER — Other Ambulatory Visit: Payer: Self-pay

## 2020-02-26 ENCOUNTER — Encounter (HOSPITAL_COMMUNITY): Payer: Self-pay

## 2020-02-26 ENCOUNTER — Emergency Department (HOSPITAL_COMMUNITY): Payer: No Typology Code available for payment source

## 2020-02-26 DIAGNOSIS — M546 Pain in thoracic spine: Secondary | ICD-10-CM | POA: Diagnosis not present

## 2020-02-26 DIAGNOSIS — R079 Chest pain, unspecified: Secondary | ICD-10-CM | POA: Diagnosis not present

## 2020-02-26 DIAGNOSIS — R0789 Other chest pain: Secondary | ICD-10-CM | POA: Diagnosis not present

## 2020-02-26 DIAGNOSIS — F17210 Nicotine dependence, cigarettes, uncomplicated: Secondary | ICD-10-CM | POA: Insufficient documentation

## 2020-02-26 DIAGNOSIS — Z91040 Latex allergy status: Secondary | ICD-10-CM | POA: Diagnosis not present

## 2020-02-26 DIAGNOSIS — R0602 Shortness of breath: Secondary | ICD-10-CM | POA: Diagnosis not present

## 2020-02-26 DIAGNOSIS — I10 Essential (primary) hypertension: Secondary | ICD-10-CM | POA: Insufficient documentation

## 2020-02-26 DIAGNOSIS — J439 Emphysema, unspecified: Secondary | ICD-10-CM | POA: Diagnosis not present

## 2020-02-26 DIAGNOSIS — R69 Illness, unspecified: Secondary | ICD-10-CM | POA: Diagnosis not present

## 2020-02-26 DIAGNOSIS — Z79899 Other long term (current) drug therapy: Secondary | ICD-10-CM | POA: Diagnosis not present

## 2020-02-26 DIAGNOSIS — R21 Rash and other nonspecific skin eruption: Secondary | ICD-10-CM | POA: Diagnosis not present

## 2020-02-26 LAB — CBC
HCT: 45.8 % (ref 36.0–46.0)
Hemoglobin: 14.3 g/dL (ref 12.0–15.0)
MCH: 30.1 pg (ref 26.0–34.0)
MCHC: 31.2 g/dL (ref 30.0–36.0)
MCV: 96.4 fL (ref 80.0–100.0)
Platelets: 252 10*3/uL (ref 150–400)
RBC: 4.75 MIL/uL (ref 3.87–5.11)
RDW: 14.5 % (ref 11.5–15.5)
WBC: 7.6 10*3/uL (ref 4.0–10.5)
nRBC: 0 % (ref 0.0–0.2)

## 2020-02-26 LAB — BASIC METABOLIC PANEL
Anion gap: 10 (ref 5–15)
BUN: 15 mg/dL (ref 6–20)
CO2: 28 mmol/L (ref 22–32)
Calcium: 9.5 mg/dL (ref 8.9–10.3)
Chloride: 102 mmol/L (ref 98–111)
Creatinine, Ser: 1 mg/dL (ref 0.44–1.00)
GFR, Estimated: >60 mL/min (ref >60)
Glucose, Bld: 103 mg/dL — ABNORMAL HIGH (ref 70–99)
Potassium: 3.8 mmol/L (ref 3.5–5.1)
Sodium: 140 mmol/L (ref 135–145)

## 2020-02-26 LAB — TROPONIN I (HIGH SENSITIVITY): Troponin I (High Sensitivity): <2 ng/L (ref <18)

## 2020-02-26 LAB — I-STAT BETA HCG BLOOD, ED (NOT ORDERABLE): I-stat hCG, quantitative: <5 m[IU]/mL (ref <5)

## 2020-02-26 MED ORDER — LIDOCAINE 5 % EX PTCH
1.0000 | MEDICATED_PATCH | CUTANEOUS | Status: DC
  Administered 2020-02-27: 1 via TRANSDERMAL
  Filled 2020-02-26: qty 1

## 2020-02-26 MED ORDER — ACETAMINOPHEN 325 MG PO TABS
650.0000 mg | ORAL_TABLET | Freq: Once | ORAL | Status: AC
  Administered 2020-02-27: 650 mg via ORAL
  Filled 2020-02-26: qty 2

## 2020-02-26 NOTE — ED Triage Notes (Signed)
Pt reports a sharp cramping sensation in her central chest that lasted a few seconds then went away. Pt reports shob and right upper back pain since the event.

## 2020-02-26 NOTE — Telephone Encounter (Signed)
Referral was placed during patient appointment.

## 2020-02-26 NOTE — ED Provider Notes (Signed)
[facility] DEPT Provider Note   CSN: [id number] Arrival date & time: 02/26/20  2059     History Chief Complaint  Patient presents with   Back Pain    Catherine [name] is a 49 y.o. female with a history of tobacco abuse, anxiety, depression, hypertension, sleep apnea & pre-diabetes who presents to the ED with complaints of back pain that began this afternoon/evening. Patient states she was at rest talking with her daughter when she developed acute onset discomfort to the central chest described as a squeezing/crapming sensation that quickly resolved within a minute or so. This made her quite nervous as she has a family hx of CAD. Chest pain did not re-occur however shortly after she started to have some R sided back pain to the scapula area and mild dyspnea. She thinks her dyspnea might be similar to what she has had with anxiety in the past.  No specific alleviating or aggravating factors to her symptoms.  She denies nausea, vomiting, diaphoresis, abdominal pain, syncope, leg pain/swelling, hemoptysis, recent surgery/trauma, recent long travel, hormone use, personal hx of cancer, or hx of DVT/PE.  Denies numbness, tingling, weakness, incontinence, fever, or hx of IVDU.  Family history of CAD, patient's father.   HPI     Past Medical History:  Diagnosis Date   Anxiety    Bronchitis    Depression    Hypertension    Sleep apnea     Patient Active Problem List   Diagnosis Date Noted   Generalized anxiety disorder 05/09/2018   Prediabetes 11/09/2017   Depression with anxiety 08/24/2017   Hypertension 07/29/2016   Obesity with serious comorbidity 07/29/2016    Past Surgical History:  Procedure Laterality Date   BUNIONECTOMY  2012   CESAREAN SECTION     X 2   LAVH  2009   Leiomyoma and menorrhagia   MYOMECTOMY  2005     OB History    Gravida  4   Para  2   Term      Preterm      AB  2   Living  2     SAB  2   TAB       Ectopic      Multiple      Live Births              Family History  Problem Relation Age of Onset   Hypertension Mother    Asthma Mother    Hypertension Father    Heart attack Father    Hypertension Sister    Asthma Daughter     Social History   Tobacco Use   Smoking status: Current Every Day Smoker    Packs/day: 0.50    Types: Cigarettes   Smokeless tobacco: Never Used  Substance Use Topics   Alcohol use: Yes    Alcohol/week: 0.0 standard drinks    Comment: socially   Drug use: No    Home Medications Prior to Admission medications   Medication Sig Start Date End Date Taking? Authorizing Provider  albuterol (VENTOLIN HFA) 108 (90 Base) MCG/ACT inhaler INHALE 1-2 PUFFS INTO THE LUNGS EVERY 6 (SIX) HOURS AS NEEDED FOR WHEEZING OR SHORTNESS OF BREATH. 09/20/19   Kerin Perna, NP  amLODipine (NORVASC) 5 MG tablet TAKE 1 TABLET BY MOUTH EVERY DAY 01/23/20   Kerin Perna, NP  buPROPion (WELLBUTRIN XL) 150 MG 24 hr tablet Take 1 tablet (150 mg total) by mouth daily. 02/21/20  Kerin Perna, NP  busPIRone (BUSPAR) 7.5 MG tablet Take 1 tablet (7.5 mg total) by mouth 3 (three) times daily. 05/18/19   Kerin Perna, NP  fluticasone (FLONASE) 50 MCG/ACT nasal spray Place into both nostrils. 01/25/20   [provider]  glimepiride (AMARYL) 2 MG tablet Take 1 tablet (2 mg total) by mouth daily before breakfast. 05/18/19   Kerin Perna, NP  hydrochlorothiazide (HYDRODIURIL) 25 MG tablet TAKE 1 TABLET BY MOUTH DAILY. TAKE ON TABLET IN THE MORNING. 05/18/19   Kerin Perna, NP  losartan (COZAAR) 50 MG tablet TAKE 2 TABLETS BY MOUTH EVERY DAY 02/14/20   Kerin Perna, NP  pravastatin (PRAVACHOL) 40 MG tablet Take 1 tablet (40 mg total) by mouth daily. 05/18/19   Kerin Perna, NP    Allergies    Metformin and related and Latex  Review of Systems   Review of Systems  Constitutional: Negative for chills, diaphoresis  and fever.  Respiratory: Positive for shortness of breath. Negative for cough.   Cardiovascular: Positive for chest pain.  Gastrointestinal: Negative for abdominal pain, blood in stool, constipation, diarrhea, nausea and vomiting.  Musculoskeletal: Positive for back pain.  Neurological: Negative for syncope, weakness and numbness.       Negative for incontinence.  All other systems reviewed and are negative.   Physical Exam Updated Vital Signs BP (!) 153/100 (BP Location: Right Arm)    Pulse 86    Temp 98.1 F (36.7 C) (Oral)    Resp 14    Ht 5\' 7"  (1.702 m)    Wt (!) 136.5 kg    SpO2 99%    BMI 47.14 kg/m   Physical Exam Vitals and nursing note reviewed.  Constitutional:      General: She is not in acute distress.    Appearance: Normal appearance. She is well-developed. She is not ill-appearing or toxic-appearing.  HENT:     Head: Normocephalic and atraumatic.  Eyes:     General:        Right eye: No discharge.        Left eye: No discharge.     Conjunctiva/sclera: Conjunctivae normal.  Neck:     Comments: No midline tenderness.  Cardiovascular:     Rate and Rhythm: Normal rate and regular rhythm.     Pulses:          Radial pulses are 2+ on the right side and 2+ on the left side.  Pulmonary:     Effort: Pulmonary effort is normal. No respiratory distress.     Breath sounds: Normal breath sounds. No wheezing, rhonchi or rales.  Abdominal:     General: There is no distension.     Palpations: Abdomen is soft.     Tenderness: There is no abdominal tenderness. There is no guarding or rebound.  Musculoskeletal:     Cervical back: Normal range of motion and neck supple.     Right lower leg: No edema.     Left lower leg: No edema.     Comments: Upper extremities: No obvious deformity, appreciable swelling, edema, erythema, ecchymosis, warmth, or open wounds. Patient has intact AROM throughout.  She is tender to palpation to the right mid thoracic paraspinal muscles in area of  the scapula. Back: No midline tenderness to palpation.  Skin:    General: Skin is warm and dry.     Capillary Refill: Capillary refill takes less than 2 seconds.     Findings: No rash.  Neurological:     Mental Status: She is alert.     Comments: Alert. Clear speech. Sensation grossly intact to bilateral upper extremities. 5/5 symmetric grip strength. Ambulatory.   Psychiatric:        Mood and Affect: Mood normal.        Behavior: Behavior normal.    ED Results / Procedures / Treatments   Labs (all labs ordered are listed, but only abnormal results are displayed) Labs Reviewed  BASIC METABOLIC PANEL - Abnormal; Notable for the following components:      Result Value   Glucose, Bld 103 (*)    All other components within normal limits  CBC  I-STAT BETA HCG BLOOD, ED ([facility], [facility], [facility] ONLY)  I-STAT BETA HCG BLOOD, ED (NOT ORDERABLE)  TROPONIN I (HIGH SENSITIVITY)  TROPONIN I (HIGH SENSITIVITY)    EKG None EKG Interpretation  Date/Time: 02/26/20 @ 23:26   Ventricular Rate: 80 bpm     QRS Duration: 91 ms   QT Interval: 380 ms   QTC Calculation: 439 ms   R Axis:  Normal   Text Interpretation: Normal sinus rhythm, no STEMI    Radiology DG Chest 2 View  Result Date: 02/26/2020 CLINICAL DATA:  49 year old female with shortness of breath. EXAM: CHEST - 2 VIEW COMPARISON:  Chest radiograph dated 04/16/2017 FINDINGS: Background of emphysema and chronic interstitial coarsening. Bilateral linear atelectasis/scarring. No focal consolidation, pleural effusion, pneumothorax. The cardiac silhouette is within limits. No acute osseous pathology. IMPRESSION: No active cardiopulmonary disease. Chronic interstitial coarsening and bronchitic changes. Electronically Signed   By: Anner Crete M.D.   On: 02/26/2020 21:40    Procedures Procedures (including critical care time)  Medications Ordered in ED Medications - No data to display  ED Course  I have reviewed the triage vital signs and  the nursing notes.  Pertinent labs & imaging results that were available during my care of the patient were reviewed by me and considered in my medical decision making (see chart for details).    MDM Rules/Calculators/A&P                         Patient presents to the ED with complaints of an episode of chest pain earlier today, and now with R upper back/scapula pain. Nontoxic, vitals with mildly elevated BP- doubt HTN emergency. On exam heart RRR, lungs CTA. Back pain reproducible w/ scapular/thoracic paraspinal muscle palpation. No neuro deficits.   Additional history obtained:  Additional history obtained from chart review nursing note reviewed. EKG: Sinus rhythm, no STEMI. Lab Tests:  I reviewed and interpreted labs, which included:  CBC, BMP, pregnancy test, and troponins: Grossly unremarkable.  Notes significant troponin elevation.  Imaging Studies ordered:  Chest x-ray ordered per triage protocol, I independently visualized and interpreted imaging which showed no acute process.  Heart pathway score 3, low risk, no significant ischemic changes or troponin elevations, I have a low suspicion for ACS.  Patient is low risk Wells, SPO2 is documented below 95% have been 1 patient has been sleeping and she does have a history of sleep apnea, I doubt pulmonary embolism at this time.  She has no widened mediastinum, pulses are symmetric, doubt dissection.  Blood work reassuring.  Unclear definitive cause to her brief set of chest pain, feel she would benefit from cardiology follow-up.  Regards to her right-sided back pain, this may be unrelated to the chest discomfort, it is reproducible with palpation and with certain  movements, high suspicion for musculoskeletal pain, will treat with naproxen and Robaxin, neurovascular intact distally, PCP follow-up.  We also discussed the need for recheck of her blood pressure in follow up given it was elevated in the ER today.  I discussed results, treatment  plan, need for follow-up, and return precautions with the patient. Provided opportunity for questions, patient confirmed understanding and is in agreement with plan.    Portions of this note were generated with [employer]. Dictation errors may occur despite best attempts at proofreading.  Final Clinical Impression(s) / ED Diagnoses Final diagnoses:  Chest pain, unspecified type  Acute right-sided thoracic back pain    Rx / DC Orders ED Discharge Orders         Ordered    naproxen (NAPROSYN) 500 MG tablet  2 times daily PRN        02/27/20 0213    methocarbamol (ROBAXIN) 500 MG tablet  Every 8 hours PRN        02/27/20 0213           Amaryllis Dyke, PA-C 02/27/20 0214    Merryl Hacker, MD 02/27/20 [phone]

## 2020-02-27 LAB — TROPONIN I (HIGH SENSITIVITY): Troponin I (High Sensitivity): 4 ng/L (ref <18)

## 2020-02-27 MED ORDER — KETOROLAC TROMETHAMINE 15 MG/ML IJ SOLN
15.0000 mg | Freq: Once | INTRAMUSCULAR | Status: AC
  Administered 2020-02-27: 15 mg via INTRAVENOUS
  Filled 2020-02-27: qty 1

## 2020-02-27 MED ORDER — NAPROXEN 500 MG PO TABS: 500.0000 mg | ORAL_TABLET | Freq: Two times a day (BID) | ORAL | 0 refills | Status: DC | PRN

## 2020-02-27 MED ORDER — METHOCARBAMOL 500 MG PO TABS: 500.0000 mg | ORAL_TABLET | Freq: Three times a day (TID) | ORAL | 0 refills | Status: DC | PRN

## 2020-02-27 NOTE — Discharge Instructions (Addendum)
You were seen in the emergency department today for chest pain. Your work-up in the emergency department has been overall reassuring. Your labs have been fairly normal and or similar to previous blood work you have had done. Your EKG and the enzyme we use to check your heart did not show an acute heart attack at this time. Your chest x-ray did not show pneumonia or other new abnormalities.   We suspect that the pain in your back may be due to a muscular problem, we are sending you home with the following medicines to try to help with your symptoms: - Naproxen is a nonsteroidal anti-inflammatory medication that will help with pain and swelling. Be sure to take this medication as prescribed with food, 1 pill every 12 hours,  It should be taken with food, as it can cause stomach upset, and more seriously, stomach bleeding. Do not take other nonsteroidal anti-inflammatory medications with this such as Advil, Motrin, Aleve, Mobic, Goodie Powder, or Motrin.    - Robaxin is the muscle relaxer I have prescribed, this is meant to help with muscle tightness. Be aware that this medication may make you drowsy therefore the first time you take this it should be at a time you are in an environment where you can rest. Do not drive or operate heavy machinery when taking this medication. Do not drink alcohol or take other sedating medications with this medicine such as narcotics or benzodiazepines.   You make take Tylenol per over the counter dosing with these medications.   We have prescribed you new medication(s) today. Discuss the medications prescribed today with your pharmacist as they can have adverse effects and interactions with your other medicines including over the counter and prescribed medications. Seek medical evaluation if you start to experience new or abnormal symptoms after taking one of these medicines, seek care immediately if you start to experience difficulty breathing, feeling of your throat closing,  facial swelling, or rash as these could be indications of a more serious allergic reaction    We would like you to follow up closely with your primary care provider and/or the cardiologist provided in your discharge instructions within 1-3 days. Return to the ER immediately should you experience any new or worsening symptoms including but not limited to return of pain, worsened pain, vomiting, shortness of breath, dizziness, lightheadedness, passing out, or any other concerns that you may have.

## 2020-02-29 ENCOUNTER — Ambulatory Visit: Payer: No Typology Code available for payment source | Admitting: Cardiology

## 2020-03-01 ENCOUNTER — Encounter: Payer: Self-pay | Admitting: Family Medicine

## 2020-03-01 ENCOUNTER — Other Ambulatory Visit: Payer: Self-pay

## 2020-03-01 ENCOUNTER — Ambulatory Visit (INDEPENDENT_AMBULATORY_CARE_PROVIDER_SITE_OTHER): Payer: No Typology Code available for payment source | Admitting: Family Medicine

## 2020-03-01 DIAGNOSIS — M545 Low back pain, unspecified: Secondary | ICD-10-CM | POA: Diagnosis not present

## 2020-03-01 DIAGNOSIS — G8929 Other chronic pain: Secondary | ICD-10-CM

## 2020-03-01 MED ORDER — CELECOXIB 200 MG PO CAPS: 200.0000 mg | ORAL_CAPSULE | Freq: Two times a day (BID) | ORAL | 6 refills | Status: DC | PRN

## 2020-03-01 MED ORDER — TIZANIDINE HCL 2 MG PO TABS
2.0000 mg | ORAL_TABLET | Freq: Four times a day (QID) | ORAL | 1 refills | Status: DC | PRN
Start: 2020-03-01 — End: 2020-03-18

## 2020-03-01 NOTE — Progress Notes (Signed)
Office Visit Note   Patient: Catherine [name]           Date of Birth: [birth year]           MRN: [id number] Visit Date: 03/01/2020 Requested by: Kerin Perna, NP [address] [place],  [place] [address] PCP: Kerin Perna, NP  Subjective: Chief Complaint  Patient presents with  . Lower Back - Pain    Pain in center of lower back and around the lateral left hip. Has been intermittent x 1 year. More frequent over the last 2 months - hurts to stand to do dishes/cook/walk around in the grocery store.    HPI: She is here with low back and left leg pain.  Symptoms started about a year ago, no injury.  Pain in the left lower lumbar area with radiation into the left leg when she stands or walks too long.  If she bends forward she gets some relief.  She has tried over-the-counter anti-inflammatories with minimal improvement.  Years ago she saw a chiropractor for back problems and got better from that.  Denies any bowel or bladder dysfunction, fevers or chills.               ROS:   All other systems were reviewed and are negative.  Objective: Vital Signs: There were no vitals taken for this visit.  Physical Exam:  General:  Alert and oriented, in no acute distress. Pulm:  Breathing unlabored. Psy:  Normal mood, congruent affect. Skin: No visible rash Low back: She has tightness in the lumbar paraspinous muscles with an exaggerated lordosis.  She is tender near the L4-5 level.  Mild tenderness in the left sciatic notch, straight leg raise negative.  Lower extremity strength and reflexes are normal.  Imaging: No results found.  Assessment & Plan: 1.  Chronic low back and left leg pain, suspicious for foraminal stenosis. -We will try physical therapy, Celebrex and Zanaflex as needed.  If she fails to improve we will order x-rays and MRI scan.     Procedures: No procedures performed  No notes on file     PMFS History: Patient Active Problem List   Diagnosis Date Noted   . Generalized anxiety disorder 05/09/2018  . Prediabetes 11/09/2017  . Depression with anxiety 08/24/2017  . Hypertension 07/29/2016  . Obesity with serious comorbidity 07/29/2016   Past Medical History:  Diagnosis Date  . Anxiety   . Bronchitis   . Depression   . Hypertension   . Sleep apnea     Family History  Problem Relation Age of Onset  . Hypertension Mother   . Asthma Mother   . Hypertension Father   . Heart attack Father   . Hypertension Sister   . Asthma Daughter     Past Surgical History:  Procedure Laterality Date  . BUNIONECTOMY  2012  . CESAREAN SECTION     X 2  . LAVH  2009   Leiomyoma and menorrhagia  . MYOMECTOMY  2005   Social History   Occupational History  . Not on file  Tobacco Use  . Smoking status: Current Every Day Smoker    Packs/day: 0.50    Types: Cigarettes  . Smokeless tobacco: Never Used  Substance and Sexual Activity  . Alcohol use: Yes    Alcohol/week: 0.0 standard drinks    Comment: socially  . Drug use: No  . Sexual activity: Yes    Birth control/protection: Surgical    Comment: 1st intercourse  49 yo-More than 5 partners-HYST

## 2020-03-06 ENCOUNTER — Other Ambulatory Visit (INDEPENDENT_AMBULATORY_CARE_PROVIDER_SITE_OTHER): Payer: Self-pay | Admitting: Primary Care

## 2020-03-06 DIAGNOSIS — Z760 Encounter for issue of repeat prescription: Secondary | ICD-10-CM

## 2020-03-06 DIAGNOSIS — I10 Essential (primary) hypertension: Secondary | ICD-10-CM

## 2020-03-07 ENCOUNTER — Other Ambulatory Visit (INDEPENDENT_AMBULATORY_CARE_PROVIDER_SITE_OTHER): Payer: Self-pay | Admitting: Primary Care

## 2020-03-07 DIAGNOSIS — I10 Essential (primary) hypertension: Secondary | ICD-10-CM

## 2020-03-07 MED ORDER — AMLODIPINE BESYLATE 5 MG PO TABS: 5.0000 mg | ORAL_TABLET | Freq: Every day | ORAL | 1 refills | Status: DC

## 2020-03-17 ENCOUNTER — Other Ambulatory Visit: Payer: Self-pay | Admitting: Family Medicine

## 2020-03-18 ENCOUNTER — Ambulatory Visit: Payer: 59

## 2020-03-26 ENCOUNTER — Ambulatory Visit: Payer: No Typology Code available for payment source | Admitting: Cardiology

## 2020-03-27 ENCOUNTER — Telehealth (INDEPENDENT_AMBULATORY_CARE_PROVIDER_SITE_OTHER): Payer: Self-pay | Admitting: Primary Care

## 2020-03-27 ENCOUNTER — Encounter: Payer: No Typology Code available for payment source | Admitting: Cardiology

## 2020-03-27 DIAGNOSIS — Z760 Encounter for issue of repeat prescription: Secondary | ICD-10-CM

## 2020-03-27 NOTE — Progress Notes (Signed)
This encounter was created in error - please disregard.

## 2020-03-28 ENCOUNTER — Other Ambulatory Visit (INDEPENDENT_AMBULATORY_CARE_PROVIDER_SITE_OTHER): Payer: Self-pay | Admitting: Primary Care

## 2020-03-28 DIAGNOSIS — Z760 Encounter for issue of repeat prescription: Secondary | ICD-10-CM

## 2020-03-29 ENCOUNTER — Ambulatory Visit: Payer: No Typology Code available for payment source | Admitting: Podiatry

## 2020-03-29 NOTE — Telephone Encounter (Signed)
PT calling to f/up on her meds request / please advise

## 2020-03-29 NOTE — Telephone Encounter (Signed)
Pt called in this morning inquiring about her Lexapro 20 mg not being refilled.     I see where it was D/C'd on 02/21/2020 during office visit because pt said it wasn't effective.  There are several requests where pt has called in requesting a refill on 11/7, 12/8, 12/9.   All these requests have been refused because she is no longer prescribed this medication.  Forwarding this to you to see what Sharyn Lull wants to do in regards to this.  Thanks.

## 2020-04-02 ENCOUNTER — Encounter (INDEPENDENT_AMBULATORY_CARE_PROVIDER_SITE_OTHER): Payer: Self-pay | Admitting: Primary Care

## 2020-04-03 ENCOUNTER — Ambulatory Visit (INDEPENDENT_AMBULATORY_CARE_PROVIDER_SITE_OTHER): Payer: 59 | Admitting: Primary Care

## 2020-04-03 ENCOUNTER — Encounter: Payer: Self-pay | Admitting: General Practice

## 2020-04-03 ENCOUNTER — Other Ambulatory Visit: Payer: Self-pay | Admitting: Family Medicine

## 2020-04-07 ENCOUNTER — Other Ambulatory Visit (INDEPENDENT_AMBULATORY_CARE_PROVIDER_SITE_OTHER): Payer: Self-pay | Admitting: Primary Care

## 2020-04-07 ENCOUNTER — Telehealth: Payer: Self-pay | Admitting: Internal Medicine

## 2020-04-07 DIAGNOSIS — F411 Generalized anxiety disorder: Secondary | ICD-10-CM

## 2020-04-07 NOTE — Telephone Encounter (Signed)
Requested Prescriptions  Pending Prescriptions Disp Refills   busPIRone (BUSPAR) 7.5 MG tablet [Pharmacy Med Name: BUSPIRONE HCL 7.5 MG TABLET] 270 tablet 1    Sig: TAKE 1 TABLET (7.5 MG TOTAL) BY MOUTH 3 (THREE) TIMES DAILY.     Psychiatry: Anxiolytics/Hypnotics - Non-controlled Passed - 04/07/2020  2:30 PM      Passed - Valid encounter within last 6 months    Recent Outpatient Visits          1 month ago Type 2 diabetes mellitus without complication, without long-term current use of insulin ([place])   [facility] Kerin Perna, NP   10 months ago Depression, unspecified depression type   [place] Kerin Perna, NP   1 year ago Essential hypertension   [place], Michelle P, NP   1 year ago Class 3 severe obesity with serious comorbidity in adult, unspecified BMI, unspecified obesity type [facility])   [place], NP   1 year ago Essential hypertension   [place], Michelle P, NP      Future Appointments            In 2 days Kerin Perna, NP [place]

## 2020-04-07 NOTE — Telephone Encounter (Signed)
Phone call received from the on-call nurse Angelica Carlena Bjornstad, RN stating that patient called in today with concerns about Wellbutrin.  Patient stated that she was started on Wellbutrin 2 weeks ago for depression and anxiety.  Since being on the medication she felt she has been more combative, crying nonstop and having insomnia.  Family states that she is walking around like a zombie and she is just not feeling like her usual self.  Denied active suicidal ideation but stated that she feels people would be better off if she was not here.  Reported in on BuSpar and Zoloft previously.  Hot flashes: Much less while she was on the BuSpar and Zoloft.  However hot flashes have now increased and feels that the increase hot flashes are also triggering her anxiety. Advised to have patient stop the Wellbutrin and I will send a message to her PCP who is Juluis Mire to reach out to her tomorrow with further instructions.  Should she develop suicidal thoughts, she should be seen in the emergency room.

## 2020-04-08 ENCOUNTER — Other Ambulatory Visit (INDEPENDENT_AMBULATORY_CARE_PROVIDER_SITE_OTHER): Payer: Self-pay | Admitting: Primary Care

## 2020-04-08 DIAGNOSIS — F418 Other specified anxiety disorders: Secondary | ICD-10-CM

## 2020-04-08 MED ORDER — ESCITALOPRAM OXALATE 20 MG PO TABS: 20.0000 mg | ORAL_TABLET | Freq: Every day | ORAL | 1 refills | Status: DC

## 2020-04-08 NOTE — Progress Notes (Signed)
Called patient d/c Wellbutrin medication intolerance. Admitted to not taking her Buspar 7.5 mg TID. Sometimes once daily, bid or tid. Loss her mother 3 years ago during the holidays.  Insomnia discussed melatonin and benadryl over the counter. Refer to psyche.

## 2020-04-09 ENCOUNTER — Telehealth (INDEPENDENT_AMBULATORY_CARE_PROVIDER_SITE_OTHER): Payer: No Typology Code available for payment source | Admitting: Primary Care

## 2020-04-10 ENCOUNTER — Ambulatory Visit (INDEPENDENT_AMBULATORY_CARE_PROVIDER_SITE_OTHER): Payer: No Typology Code available for payment source | Admitting: Podiatry

## 2020-04-10 ENCOUNTER — Other Ambulatory Visit: Payer: Self-pay

## 2020-04-10 DIAGNOSIS — B351 Tinea unguium: Secondary | ICD-10-CM | POA: Diagnosis not present

## 2020-04-10 DIAGNOSIS — Z794 Long term (current) use of insulin: Secondary | ICD-10-CM | POA: Diagnosis not present

## 2020-04-10 DIAGNOSIS — Z79899 Other long term (current) drug therapy: Secondary | ICD-10-CM | POA: Diagnosis not present

## 2020-04-10 DIAGNOSIS — M79674 Pain in right toe(s): Secondary | ICD-10-CM

## 2020-04-10 DIAGNOSIS — M79675 Pain in left toe(s): Secondary | ICD-10-CM | POA: Diagnosis not present

## 2020-04-10 DIAGNOSIS — E119 Type 2 diabetes mellitus without complications: Secondary | ICD-10-CM

## 2020-04-15 ENCOUNTER — Encounter: Payer: Self-pay | Admitting: Podiatry

## 2020-04-15 NOTE — Progress Notes (Signed)
  Subjective:  Patient ID: Catherine [name], female    DOB: February 02, 1971,  MRN: [id number]  Chief Complaint  Patient presents with  . diabetic foot care    Diabetic foot exam. Nail trim and dry skin    49 y.o. female returns for the above complaint.  Patient presents with thickened elongated dystrophic toenails x10.  Patient states that they are painful to touch.  She would like to discuss trimming them down as she is not able to do it.  She states that that hurts when ambulating.  She is a diabetic with last A1c of 6.8.  She would like to also discuss treatment options for this.  She has tried some over-the-counter medication none of which has helped.  She would like to discuss oral treatment options  Objective:  There were no vitals filed for this visit. Podiatric Exam: Vascular: dorsalis pedis and posterior tibial pulses are palpable bilateral. Capillary return is immediate. Temperature gradient is WNL. Skin turgor WNL  Sensorium: Normal Semmes Weinstein monofilament test. Normal tactile sensation bilaterally. Nail Exam: Pt has thick disfigured discolored nails with subungual debris noted bilateral entire nail hallux through fifth toenails.  Pain on palpation to the nails. Ulcer Exam: There is no evidence of ulcer or pre-ulcerative changes or infection. Orthopedic Exam: Muscle tone and strength are WNL. No limitations in general ROM. No crepitus or effusions noted. HAV  B/L.  Hammer toes 2-5  B/L. Skin: No Porokeratosis. No infection or ulcers    Assessment & Plan:   1. Long-term use of high-risk medication   2. Nail fungus   3. Onychomycosis due to dermatophyte   4. Pain due to onychomycosis of toenails of both feet   5. Controlled type 2 diabetes mellitus without complication, with long-term current use of insulin (HCC)     Patient was evaluated and treated and all questions answered.  Onychomycosis with pain  -Nails palliatively debrided as below. -Educated on self-care -Educated  the patient on the etiology of onychomycosis and various treatment options associated with improving the fungal load.  I explained to the patient that there is 3 treatment options available to treat the onychomycosis including topical, p.o., laser treatment.  Patient elected to undergo p.o. options with Lamisil/terbinafine therapy.  In order for me to start the medication therapy, I explained to the patient the importance of evaluating the liver and obtaining the liver function test.  Once the liver function test comes back normal I will start him on 33-month course of Lamisil therapy.  Patient understood all risk and would like to proceed with Lamisil therapy.  I have asked the patient to immediately stop the Lamisil therapy if she has any reactions to it and call the office or go to the emergency room right away.  Patient states understanding    Procedure: Nail Debridement Rationale: pain  Type of Debridement: manual, sharp debridement. Instrumentation: Nail nipper, rotary burr. Number of Nails: 10  Procedures and Treatment: Consent by patient was obtained for treatment procedures. The patient understood the discussion of treatment and procedures well. All questions were answered thoroughly reviewed. Debridement of mycotic and hypertrophic toenails, 1 through 5 bilateral and clearing of subungual debris. No ulceration, no infection noted.  Return Visit-Office Procedure: Patient instructed to return to the office for a follow up visit 3 months for continued evaluation and treatment.  Nicholes Rough, DPM    No follow-ups on file.

## 2020-04-30 ENCOUNTER — Ambulatory Visit: Payer: No Typology Code available for payment source

## 2020-04-30 ENCOUNTER — Other Ambulatory Visit: Payer: Self-pay

## 2020-04-30 ENCOUNTER — Encounter: Payer: Self-pay | Admitting: Cardiology

## 2020-04-30 ENCOUNTER — Ambulatory Visit (INDEPENDENT_AMBULATORY_CARE_PROVIDER_SITE_OTHER): Payer: No Typology Code available for payment source | Admitting: Cardiology

## 2020-04-30 VITALS — BP 130/83 | HR 84 | Ht 67.0 in | Wt 305.0 lb

## 2020-04-30 DIAGNOSIS — R079 Chest pain, unspecified: Secondary | ICD-10-CM

## 2020-04-30 DIAGNOSIS — R072 Precordial pain: Secondary | ICD-10-CM

## 2020-04-30 DIAGNOSIS — I10 Essential (primary) hypertension: Secondary | ICD-10-CM

## 2020-04-30 DIAGNOSIS — U071 COVID-19: Secondary | ICD-10-CM | POA: Diagnosis not present

## 2020-04-30 DIAGNOSIS — G4733 Obstructive sleep apnea (adult) (pediatric): Secondary | ICD-10-CM | POA: Diagnosis not present

## 2020-04-30 MED ORDER — METOPROLOL TARTRATE 100 MG PO TABS: ORAL_TABLET | ORAL | 0 refills | Status: DC

## 2020-04-30 NOTE — Progress Notes (Signed)
Cardiology Consult Note    Date:  04/30/2020   ID:  Catherine [name], DOB [birth year], MRN [id number]  PCP:  Kerin Perna, NP  Cardiologist:  Fransico Him, MD   Chief Complaint  Patient presents with  . New Patient (Initial Visit)    Chest pain    History of Present Illness:  Catherine [name] is a 50 y.o. female who is being seen today for the evaluation of chest pain at the request of Horton, Barbette Hair, MD.  This is a 50yo morbidly obese AAF with a hx of depression, OSA and HTN.  She has a hx of HLD and had been on Pravastatin but took herself off of it but dose not remember why.  She was recently seen in [facility] ER in Nov 2021 with complaints of chest pain.  She has a hx of tobacco abuse and pre DM as well.    She had been under a lot of stress and the morning of her admission she suddenly developed chest pain that was central in location and described as a squeezing and cramping sensation that only lasted a minute and resolved on its own.  She says that she felt weird all over.  She has a family hx of CAD and she became nervous and presented to the ER.  The chest pain never resolved but she then developed right sided back pain in her scapular area associated with mild SOB but thought it may be anxiety.  There was no associated nausea, vomiting or diaphoresis.  Hs Trop was normal x 2.  EKG showed NSR with no ST changes.    Her father has a hx of massive MI at 35 and died.  He was a smoker.  She also smokes as much as 1/2ppd on a stressful day.  Since her ER visit she has not had any further CP.  She has chronic back pain she attributes to the mattress she sleeps on.  She had COVID 19 in Sept and has not felt right since then.  She has chronic LE edema fairly well controlled on with diuretics.  She occasionally salts her food.  She denies any palpitations, dizziness or syncope.     Past Medical History:  Diagnosis Date  . Anxiety   . Bronchitis   . Depression   . Hypertension   . Sleep  apnea     Past Surgical History:  Procedure Laterality Date  . BUNIONECTOMY  2012  . CESAREAN SECTION     X 2  . LAVH  2009   Leiomyoma and menorrhagia  . MYOMECTOMY  2005    Current Medications: No outpatient medications have been marked as taking for the 04/30/20 encounter (Office Visit) with Sueanne Margarita, MD.    Allergies:   Metformin and related and Latex   Social History   Socioeconomic History  . Marital status: Single    Spouse name: Not on file  . Number of children: Not on file  . Years of education: Not on file  . Highest education level: Not on file  Occupational History  . Not on file  Tobacco Use  . Smoking status: Current Every Day Smoker    Packs/day: 0.50    Types: Cigarettes  . Smokeless tobacco: Never Used  Substance and Sexual Activity  . Alcohol use: Yes    Alcohol/week: 0.0 standard drinks    Comment: socially  . Drug use: No  . Sexual activity: Yes    Birth  control/protection: Surgical    Comment: 1st intercourse 50 yo-More than 5 partners-HYST  Other Topics Concern  . Not on file  Social History Narrative  . Not on file   Social Determinants of Health   Financial Resource Strain: Not on file  Food Insecurity: Not on file  Transportation Needs: Not on file  Physical Activity: Not on file  Stress: Not on file  Social Connections: Not on file     Family History:  The patient's family history includes Asthma in her daughter and mother; Heart attack in her father; Hypertension in her father, mother, and sister.   ROS:   Please see the history of present illness.    ROS All other systems reviewed and are negative.  No flowsheet data found.     PHYSICAL EXAM:   VS:  BP 130/83   Pulse 84   Ht 5\' 7"  (1.702 m)   Wt (!) 305 lb (138.3 kg)   SpO2 98%   BMI 47.77 kg/m    GEN: Well nourished, well developed, in no acute distress  HEENT: normal  Neck: no JVD, carotid bruits, or masses Cardiac: RRR; no murmurs, rubs, or gallops,no  edema.  Intact distal pulses bilaterally.  Respiratory:  clear to auscultation bilaterally, normal work of breathing GI: soft, nontender, nondistended, + BS MS: no deformity or atrophy  Skin: warm and dry, no rash Neuro:  Alert and Oriented x 3, Strength and sensation are intact Psych: euthymic mood, full affect  Wt Readings from Last 3 Encounters:  04/30/20 (!) 305 lb (138.3 kg)  02/26/20 (!) 301 lb (136.5 kg)  02/21/20 (!) 301 lb 9.6 oz (136.8 kg)      Studies/Labs Reviewed:   EKG:  EKG is not ordered today.    Recent Labs: 02/26/2020: BUN 15; Creatinine, Ser 1.00; Hemoglobin 14.3; Platelets 252; Potassium 3.8; Sodium 140   Lipid Panel    Component Value Date/Time   CHOL 166 11/14/2018 0920   TRIG 88 11/14/2018 0920   HDL 51 11/14/2018 0920   CHOLHDL 3.3 11/14/2018 0920   CHOLHDL 3.4 05/16/2013 1625   VLDL 17 05/16/2013 1625   LDLCALC 97 11/14/2018 0920    Additional studies/ records that were reviewed today include:  Hospital ER records    ASSESSMENT:    1. Chest pain of uncertain etiology   2. Primary hypertension   3. OSA (obstructive sleep apnea)   4. COVID-19      PLAN:  In order of problems listed above:  1. Chest pain -she has only had 1 episode of CP but has a strong fm hx of premature CAD, she smokes and has HTN and HLD -I will get a coronary CTA to define coronary anatomy  2.  HTN -BP controlled on exam today -continue amlodipine 5mg  daily, Losartan 100mg  daily, HCTZ 25mg  daily  3.  OSA -she was dx with OSA a few years ago with mild OSA but never followed through with PAP device due to insurance that she had at the time -will refer for PSG  4.  COVID 19 -she has not felt right since she had COVID 19 in Sept 2021 with some DOE -check 2D echo to assess LVF    Medication Adjustments/Labs and Tests Ordered: Current medicines are reviewed at length with the patient today.  Concerns regarding medicines are outlined above.  Medication changes,  Labs and Tests ordered today are listed in the Patient Instructions below.  There are no Patient Instructions on file for this  visit.   Signed, Fransico Him, MD  04/30/2020 4:07 PM    [facility] [place], [place], [place]  [address] Phone: [phone]; Fax: [phone]

## 2020-04-30 NOTE — Addendum Note (Signed)
Addended by: Antonieta Iba on: 04/30/2020 04:16 PM   Modules accepted: Orders

## 2020-04-30 NOTE — Patient Instructions (Addendum)
Medication Instructions:  Your physician recommends that you continue on your current medications as directed. Please refer to the Current Medication list given to you today.  *If you need a refill on your cardiac medications before your next appointment, please call your pharmacy*   Testing/Procedures: Your physician has requested that you have an echocardiogram. Echocardiography is a painless test that uses sound waves to create images of your heart. It provides your doctor with information about the size and shape of your heart and how well your heart's chambers and valves are working. This procedure takes approximately one hour. There are no restrictions for this procedure.  Your physician has requested that you have a coronary CTA scan. Please see next page for further instructions.   Follow-Up: At [facility], you and your health needs are our priority.  As part of our continuing mission to provide you with exceptional heart care, we have created designated Provider Care Teams.  These Care Teams include your primary Cardiologist (physician) and Advanced Practice Providers (APPs -  Physician Assistants and Nurse Practitioners) who all work together to provide you with the care you need, when you need it.  Follow up with Dr. Radford Pax as needed based on results of testing.    Other Instructions Your cardiac CT will be scheduled at:   [facility] [address] [place], [place] [address] [phone]  Please arrive at the [facility] main entrance of [facility] 30 minutes prior to test start time. Proceed to the [facility] Radiology Department (first floor) to check-in and test prep.   Please follow these instructions carefully (unless otherwise directed):  On the Night Before the Test: . Be sure to Drink plenty of water. . Do not consume any caffeinated/decaffeinated beverages or chocolate 12 hours prior to your test. . Do not take any antihistamines 12  hours prior to your test.  On the Day of the Test: . Drink plenty of water. Do not drink any water within one hour of the test. . Do not eat any food 4 hours prior to the test. . You may take your regular medications prior to the test.  . Take metoprolol (Lopressor) two hours prior to test. . HOLD Hydrochlorothiazide morning of the test. . FEMALES- please wear underwire-free bra if available  After the Test: . Drink plenty of water. . After receiving IV contrast, you may experience a mild flushed feeling. This is normal. . On occasion, you may experience a mild rash up to 24 hours after the test. This is not dangerous. If this occurs, you can take Benadryl 25 mg and increase your fluid intake. . If you experience trouble breathing, this can be serious. If it is severe call 911 IMMEDIATELY. If it is mild, please call our office. . If you take any of these medications: Glipizide/Metformin, Avandament, Glucavance, please do not take 48 hours after completing test unless otherwise instructed.   Once we have confirmed authorization from your insurance company, we will call you to set up a date and time for your test. Based on how quickly your insurance processes prior authorizations requests, please allow up to 4 weeks to be contacted for scheduling your Cardiac CT appointment. Be advised that routine Cardiac CT appointments could be scheduled as many as 8 weeks after your provider has ordered it.  For non-scheduling related questions, please contact the cardiac imaging nurse navigator should you have any questions/concerns: Marchia Bond, Cardiac Imaging Nurse Navigator [place], Interim Cardiac  Imaging Nurse [place] [facility] Direct Office Dial: [phone]   For scheduling needs, including cancellations and rescheduling, please call [place], [phone].

## 2020-05-08 ENCOUNTER — Other Ambulatory Visit (INDEPENDENT_AMBULATORY_CARE_PROVIDER_SITE_OTHER): Payer: Self-pay | Admitting: Primary Care

## 2020-05-08 DIAGNOSIS — Z760 Encounter for issue of repeat prescription: Secondary | ICD-10-CM

## 2020-05-20 ENCOUNTER — Telehealth (INDEPENDENT_AMBULATORY_CARE_PROVIDER_SITE_OTHER): Payer: No Typology Code available for payment source | Admitting: Primary Care

## 2020-05-20 ENCOUNTER — Encounter (INDEPENDENT_AMBULATORY_CARE_PROVIDER_SITE_OTHER): Payer: Self-pay | Admitting: Primary Care

## 2020-05-20 ENCOUNTER — Encounter (INDEPENDENT_AMBULATORY_CARE_PROVIDER_SITE_OTHER): Payer: Self-pay

## 2020-05-20 DIAGNOSIS — U099 Post covid-19 condition, unspecified: Secondary | ICD-10-CM

## 2020-05-20 DIAGNOSIS — R5382 Chronic fatigue, unspecified: Secondary | ICD-10-CM | POA: Diagnosis not present

## 2020-05-20 DIAGNOSIS — F418 Other specified anxiety disorders: Secondary | ICD-10-CM | POA: Diagnosis not present

## 2020-05-20 DIAGNOSIS — R69 Illness, unspecified: Secondary | ICD-10-CM | POA: Diagnosis not present

## 2020-05-20 NOTE — Progress Notes (Signed)
Telephone Note  I connected with Catherine [name] on 05/20/20 at  4:10 PM EST by telephone and verified that I am speaking with the correct person using two identifiers.  Location: Patient: home Provider: Kerin Perna @[facility]    I discussed the limitations, risks, security and privacy concerns of performing an evaluation and management service by telephone and the availability of in person appointments. I also discussed with the patient that there may be a patient responsible charge related to this service. The patient expressed understanding and agreed to proceed.   History of Present Illness: [name] is a 50 year old female follow up on Lexapro 20 mg she feels the medication is helping and realizes when she miss a dose it gives her havoc.Helping with sleeping. She has an cardiology appt tomorrow patient will ask about being on phentermine. She still feels fatigue and sleepy during the day.      Past Medical History:  Diagnosis Date  . Anxiety   . Bronchitis   . Depression   . Hypertension   . Sleep apnea    Current Outpatient Medications on File Prior to Visit  Medication Sig Dispense Refill  . albuterol (VENTOLIN HFA) 108 (90 Base) MCG/ACT inhaler INHALE 1-2 PUFFS INTO THE LUNGS EVERY 6 (SIX) HOURS AS NEEDED FOR WHEEZING OR SHORTNESS OF BREATH. 18 g 1  . amLODipine (NORVASC) 5 MG tablet Take 1 tablet (5 mg total) by mouth daily. 90 tablet 1  . busPIRone (BUSPAR) 7.5 MG tablet TAKE 1 TABLET (7.5 MG TOTAL) BY MOUTH 3 (THREE) TIMES DAILY. 270 tablet 1  . celecoxib (CELEBREX) 200 MG capsule Take 1 capsule (200 mg total) by mouth 2 (two) times daily as needed. 60 capsule 6  . escitalopram (LEXAPRO) 20 MG tablet Take 1 tablet (20 mg total) by mouth daily. 90 tablet 1  . fluticasone (FLONASE) 50 MCG/ACT nasal spray Place into both nostrils.    Marland Kitchen glimepiride (AMARYL) 2 MG tablet Take 1 tablet (2 mg total) by mouth daily before breakfast. 90 tablet 1  . hydrochlorothiazide  (HYDRODIURIL) 25 MG tablet TAKE 1 TABLET BY MOUTH DAILY. TAKE ON TABLET IN THE MORNING. 90 tablet 0  . losartan (COZAAR) 50 MG tablet TAKE 2 TABLETS BY MOUTH EVERY DAY 180 tablet 0  . methocarbamol (ROBAXIN) 500 MG tablet Take 1 tablet (500 mg total) by mouth every 8 (eight) hours as needed for muscle spasms. 15 tablet 0  . metoprolol tartrate (LOPRESSOR) 100 MG tablet Take 1 tablet (100 mg total) two hours prior to CT scan. 1 tablet 0  . pravastatin (PRAVACHOL) 40 MG tablet Take 1 tablet (40 mg total) by mouth daily. 90 tablet 1  . tiZANidine (ZANAFLEX) 2 MG tablet TAKE 1-2 TABLETS (2-4 MG TOTAL) BY MOUTH EVERY 6 (SIX) HOURS AS NEEDED FOR MUSCLE SPASMS. 60 tablet 1   No current facility-administered medications on file prior to visit.   Observations/Objective  Pertinent negative and positive noted    Assessment and Plan: Diagnoses and all orders for this visit:  Depression with anxiety States leave my Lexapro alone no changes needed. This is an indication it is helping and also allowing her to sleep at bedtime.   Post-COVID chronic fatigue This is one of the problems and takes time to resolve. Note cardiologist is f/u with cardiac imaging to determine if infection affected her heart.   Follow Up Instructions:    I discussed the assessment and treatment plan with the patient. The patient was provided  an opportunity to ask questions and all were answered. The patient agreed with the plan and demonstrated an understanding of the instructions.   The patient was advised to call back or seek an in-person evaluation if the symptoms worsen or if the condition fails to improve as anticipated.  I provided 16 minutes of non-face-to-face time during this encounter.   Kerin Perna, NP

## 2020-05-20 NOTE — Progress Notes (Signed)
Needs FMLA forms completed again

## 2020-05-21 ENCOUNTER — Ambulatory Visit (HOSPITAL_COMMUNITY): Payer: No Typology Code available for payment source

## 2020-05-21 ENCOUNTER — Other Ambulatory Visit: Payer: No Typology Code available for payment source

## 2020-05-23 DIAGNOSIS — I10 Essential (primary) hypertension: Secondary | ICD-10-CM

## 2020-05-27 ENCOUNTER — Other Ambulatory Visit: Payer: Self-pay

## 2020-05-27 ENCOUNTER — Other Ambulatory Visit: Payer: No Typology Code available for payment source | Admitting: *Deleted

## 2020-05-27 DIAGNOSIS — I10 Essential (primary) hypertension: Secondary | ICD-10-CM

## 2020-05-28 LAB — BASIC METABOLIC PANEL
BUN/Creatinine Ratio: 20 (ref 9–23)
BUN: 16 mg/dL (ref 6–24)
CO2: 24 mmol/L (ref 20–29)
Calcium: 9.4 mg/dL (ref 8.7–10.2)
Chloride: 102 mmol/L (ref 96–106)
Creatinine, Ser: 0.79 mg/dL (ref 0.57–1.00)
GFR calc Af Amer: 102 mL/min/{1.73_m2} (ref >59)
GFR calc non Af Amer: 88 mL/min/{1.73_m2} (ref >59)
Glucose: 111 mg/dL — ABNORMAL HIGH (ref 65–99)
Potassium: 3.7 mmol/L (ref 3.5–5.2)
Sodium: 144 mmol/L (ref 134–144)

## 2020-05-30 ENCOUNTER — Encounter (INDEPENDENT_AMBULATORY_CARE_PROVIDER_SITE_OTHER): Payer: Self-pay | Admitting: Primary Care

## 2020-05-30 ENCOUNTER — Telehealth (HOSPITAL_COMMUNITY): Payer: Self-pay | Admitting: *Deleted

## 2020-05-30 NOTE — Telephone Encounter (Signed)
Reaching out to patient to offer assistance regarding upcoming cardiac imaging study; pt verbalizes understanding of appt date/time, parking situation and where to check in, pre-test NPO status and medications ordered, and verified current allergies; name and call back number provided for further questions should they arise  Cassady Turano RN Navigator Cardiac Imaging [facility] [phone] office [phone] cell  

## 2020-05-31 ENCOUNTER — Other Ambulatory Visit: Payer: Self-pay

## 2020-05-31 ENCOUNTER — Ambulatory Visit (HOSPITAL_COMMUNITY)
Admission: RE | Admit: 2020-05-31 | Discharge: 2020-05-31 | Disposition: A | Discharge status: Home / Self Care | Payer: No Typology Code available for payment source | Source: Ambulatory Visit | Attending: Cardiology | Admitting: Cardiology

## 2020-05-31 DIAGNOSIS — R072 Precordial pain: Secondary | ICD-10-CM | POA: Insufficient documentation

## 2020-05-31 MED ORDER — NITROGLYCERIN 0.4 MG SL SUBL
0.8000 mg | SUBLINGUAL_TABLET | Freq: Once | SUBLINGUAL | Status: AC | PRN
  Administered 2020-05-31: 0.8 mg via SUBLINGUAL

## 2020-05-31 MED ORDER — METOPROLOL TARTRATE 5 MG/5ML IV SOLN: 5.0000 mg | INTRAVENOUS | Status: DC | PRN

## 2020-05-31 MED ORDER — NITROGLYCERIN 0.4 MG SL SUBL
SUBLINGUAL_TABLET | SUBLINGUAL | Status: AC
  Filled 2020-05-31: qty 2

## 2020-05-31 MED ORDER — IOHEXOL 350 MG/ML SOLN
80.0000 mL | Freq: Once | INTRAVENOUS | Status: AC | PRN
  Administered 2020-05-31: 80 mL via INTRAVENOUS

## 2020-05-31 MED ORDER — METOPROLOL TARTRATE 5 MG/5ML IV SOLN
INTRAVENOUS | Status: AC
  Filled 2020-05-31: qty 10

## 2020-06-01 ENCOUNTER — Other Ambulatory Visit (INDEPENDENT_AMBULATORY_CARE_PROVIDER_SITE_OTHER): Payer: Self-pay | Admitting: Primary Care

## 2020-06-01 DIAGNOSIS — Z760 Encounter for issue of repeat prescription: Secondary | ICD-10-CM

## 2020-06-05 DIAGNOSIS — Z20822 Contact with and (suspected) exposure to covid-19: Secondary | ICD-10-CM | POA: Diagnosis not present

## 2020-06-06 ENCOUNTER — Telehealth: Payer: Self-pay | Admitting: Cardiology

## 2020-06-06 DIAGNOSIS — R079 Chest pain, unspecified: Secondary | ICD-10-CM

## 2020-06-06 NOTE — Telephone Encounter (Signed)
Sueanne Margarita, MD  06/03/2020 2:12 PM EST      Coronary Ca score is low with no obvious CAD. Please find out if she has had any further CP. Please have her come in for FLP and ALT as I would like to see her LDL < 70.   The patient has been notified of the result and verbalized understanding.  All questions (if any) were answered. Antonieta Iba, RN 06/06/2020 3:49 PM  She has not had anymore chest pain. She will come in for fasting lipids and ALT on Monday 2/21

## 2020-06-06 NOTE — Telephone Encounter (Signed)
Follow Up:      Pt is returning Catherine [name]'s call from yesterday, concerning her CT results.

## 2020-06-07 ENCOUNTER — Ambulatory Visit: Payer: No Typology Code available for payment source

## 2020-06-07 DIAGNOSIS — I10 Essential (primary) hypertension: Secondary | ICD-10-CM

## 2020-06-10 ENCOUNTER — Other Ambulatory Visit: Payer: No Typology Code available for payment source

## 2020-06-10 ENCOUNTER — Encounter (INDEPENDENT_AMBULATORY_CARE_PROVIDER_SITE_OTHER): Payer: Self-pay | Admitting: Primary Care

## 2020-06-11 ENCOUNTER — Other Ambulatory Visit: Payer: Self-pay

## 2020-06-11 ENCOUNTER — Other Ambulatory Visit: Payer: No Typology Code available for payment source | Admitting: *Deleted

## 2020-06-11 DIAGNOSIS — I10 Essential (primary) hypertension: Secondary | ICD-10-CM | POA: Diagnosis not present

## 2020-06-12 LAB — BASIC METABOLIC PANEL
BUN/Creatinine Ratio: 15 (ref 9–23)
BUN: 13 mg/dL (ref 6–24)
CO2: 26 mmol/L (ref 20–29)
Calcium: 9.6 mg/dL (ref 8.7–10.2)
Chloride: 99 mmol/L (ref 96–106)
Creatinine, Ser: 0.87 mg/dL (ref 0.57–1.00)
GFR calc Af Amer: 90 mL/min/{1.73_m2} (ref >59)
GFR calc non Af Amer: 78 mL/min/{1.73_m2} (ref >59)
Glucose: 105 mg/dL — ABNORMAL HIGH (ref 65–99)
Potassium: 4.2 mmol/L (ref 3.5–5.2)
Sodium: 140 mmol/L (ref 134–144)

## 2020-06-12 LAB — TSH: TSH: 1.98 u[IU]/mL (ref 0.450–4.500)

## 2020-06-14 ENCOUNTER — Other Ambulatory Visit (HOSPITAL_COMMUNITY): Payer: No Typology Code available for payment source

## 2020-06-17 DIAGNOSIS — R600 Localized edema: Secondary | ICD-10-CM | POA: Diagnosis not present

## 2020-06-25 ENCOUNTER — Other Ambulatory Visit: Payer: Self-pay

## 2020-06-25 ENCOUNTER — Ambulatory Visit (INDEPENDENT_AMBULATORY_CARE_PROVIDER_SITE_OTHER): Payer: No Typology Code available for payment source | Admitting: Primary Care

## 2020-06-25 ENCOUNTER — Encounter (INDEPENDENT_AMBULATORY_CARE_PROVIDER_SITE_OTHER): Payer: Self-pay | Admitting: Primary Care

## 2020-06-25 VITALS — BP 137/84 | HR 65 | Temp 97.2°F | Ht 67.0 in | Wt 311.2 lb

## 2020-06-25 DIAGNOSIS — Z760 Encounter for issue of repeat prescription: Secondary | ICD-10-CM

## 2020-06-25 DIAGNOSIS — I10 Essential (primary) hypertension: Secondary | ICD-10-CM

## 2020-06-25 DIAGNOSIS — Z1211 Encounter for screening for malignant neoplasm of colon: Secondary | ICD-10-CM | POA: Diagnosis not present

## 2020-06-25 NOTE — Progress Notes (Signed)
Acute Office Visit  Subjective:    Patient ID: Catherine [name], female    DOB: 11-04-1970, 50 y.o.   MRN: [id number]  Chief Complaint  Patient presents with  . Edema    HPI [name], is a 50 year old obese female who presents for bilateral edematous feet. We have been My charting . I suggested ted hose she purchase them and felt better and no swelling while wearing. When she goes to bed feet begin to swell. She reached out to her cardiologist they recommended ted hose and elevating feet at night. She is concern that nothing is working but her feet continue to swollen.  Past Medical History:  Diagnosis Date  . Anxiety   . Bronchitis   . Depression   . Hypertension   . Sleep apnea     Past Surgical History:  Procedure Laterality Date  . BUNIONECTOMY  2012  . CESAREAN SECTION     X 2  . LAVH  2009   Leiomyoma and menorrhagia  . MYOMECTOMY  2005    Family History  Problem Relation Age of Onset  . Hypertension Mother   . Asthma Mother   . Hypertension Father   . Heart attack Father        died at 57 of an MI  . Hypertension Sister   . Asthma Daughter     Social History   Socioeconomic History  . Marital status: Single    Spouse name: Not on file  . Number of children: Not on file  . Years of education: Not on file  . Highest education level: Not on file  Occupational History  . Not on file  Tobacco Use  . Smoking status: Current Every Day Smoker    Packs/day: 0.50    Types: Cigarettes  . Smokeless tobacco: Never Used  Substance and Sexual Activity  . Alcohol use: Yes    Alcohol/week: 0.0 standard drinks    Comment: socially  . Drug use: No  . Sexual activity: Yes    Birth control/protection: Surgical    Comment: 1st intercourse 50 yo-More than 5 partners-HYST  Other Topics Concern  . Not on file  Social History Narrative  . Not on file   Social Determinants of Health   Financial Resource Strain: Not on file  Food Insecurity: Not on file   Transportation Needs: Not on file  Physical Activity: Not on file  Stress: Not on file  Social Connections: Not on file  Intimate Partner Violence: Not on file    Outpatient Medications Prior to Visit  Medication Sig Dispense Refill  . albuterol (VENTOLIN HFA) 108 (90 Base) MCG/ACT inhaler INHALE 1-2 PUFFS INTO THE LUNGS EVERY 6 (SIX) HOURS AS NEEDED FOR WHEEZING OR SHORTNESS OF BREATH. 18 g 1  . amLODipine (NORVASC) 5 MG tablet Take 1 tablet (5 mg total) by mouth daily. 90 tablet 1  . busPIRone (BUSPAR) 7.5 MG tablet TAKE 1 TABLET (7.5 MG TOTAL) BY MOUTH 3 (THREE) TIMES DAILY. 270 tablet 1  . celecoxib (CELEBREX) 200 MG capsule Take 1 capsule (200 mg total) by mouth 2 (two) times daily as needed. 60 capsule 6  . escitalopram (LEXAPRO) 20 MG tablet Take 1 tablet (20 mg total) by mouth daily. 90 tablet 1  . fluticasone (FLONASE) 50 MCG/ACT nasal spray Place into both nostrils.    Marland Kitchen glimepiride (AMARYL) 2 MG tablet Take 1 tablet (2 mg total) by mouth daily before breakfast. 90 tablet 1  .  hydrochlorothiazide (HYDRODIURIL) 25 MG tablet TAKE 1 TABLET BY MOUTH DAILY. TAKE ON TABLET IN THE MORNING. 90 tablet 0  . losartan (COZAAR) 50 MG tablet TAKE 2 TABLETS BY MOUTH EVERY DAY 180 tablet 0  . methocarbamol (ROBAXIN) 500 MG tablet Take 1 tablet (500 mg total) by mouth every 8 (eight) hours as needed for muscle spasms. 15 tablet 0  . metoprolol tartrate (LOPRESSOR) 100 MG tablet Take 1 tablet (100 mg total) two hours prior to CT scan. 1 tablet 0  . pravastatin (PRAVACHOL) 40 MG tablet Take 1 tablet (40 mg total) by mouth daily. 90 tablet 1  . tiZANidine (ZANAFLEX) 2 MG tablet TAKE 1-2 TABLETS (2-4 MG TOTAL) BY MOUTH EVERY 6 (SIX) HOURS AS NEEDED FOR MUSCLE SPASMS. 60 tablet 1   No facility-administered medications prior to visit.    Allergies  Allergen Reactions  . Metformin And Related Other (See Comments)    Abdominal discomfort.  . Latex Rash    Review of Systems Pertinent positive and  negative noted     Objective:    Physical Exam Vitals reviewed.  Constitutional:      Appearance: She is obese.  HENT:     Head: Normocephalic.     Right Ear: External ear normal.     Left Ear: External ear normal.  Cardiovascular:     Rate and Rhythm: Normal rate and regular rhythm.  Pulmonary:     Effort: Pulmonary effort is normal.     Breath sounds: Normal breath sounds.  Musculoskeletal:     Cervical back: Normal range of motion and neck supple.     Right lower leg: Edema present.     Left lower leg: Edema present.  Skin:    General: Skin is warm and dry.  Neurological:     Mental Status: She is oriented to person, place, and time.  Psychiatric:        Mood and Affect: Mood normal.        Behavior: Behavior normal.        Thought Content: Thought content normal.        Judgment: Judgment normal.     BP 137/84 (BP Location: Right Arm, Patient Position: Sitting, Cuff Size: Large)   Pulse 65   Temp (!) 97.2 F (36.2 C) (Temporal)   Ht 5\' 7"  (1.702 m)   Wt (!) 311 lb 3.2 oz (141.2 kg)   SpO2 99%   BMI 48.74 kg/m  Wt Readings from Last 3 Encounters:  06/25/20 (!) 311 lb 3.2 oz (141.2 kg)  04/30/20 (!) 305 lb (138.3 kg)  02/26/20 (!) 301 lb (136.5 kg)    Health Maintenance Due  Topic Date Due  . PAP SMEAR-Modifier  05/16/2014  . COLONOSCOPY (Pts 45-68yrs Insurance coverage will need to be confirmed)  Never done  . OPHTHALMOLOGY EXAM  12/29/2019  . COVID-19 Vaccine (3 - Booster for Pfizer series) 02/03/2020  . MAMMOGRAM  03/13/2020    There are no preventive care reminders to display for this patient.   Lab Results  Component Value Date   TSH 1.980 06/11/2020   Lab Results  Component Value Date   WBC 7.6 02/26/2020   HGB 14.3 02/26/2020   HCT 45.8 02/26/2020   MCV 96.4 02/26/2020   PLT 252 02/26/2020   Lab Results  Component Value Date   NA 140 06/11/2020   K 4.2 06/11/2020   CO2 26 06/11/2020   GLUCOSE 105 (H) 06/11/2020   BUN 13 06/11/2020  CREATININE 0.87 06/11/2020   BILITOT 0.3 11/14/2018   ALKPHOS 65 11/14/2018   AST 19 11/14/2018   ALT 25 11/14/2018   PROT 6.4 11/14/2018   ALBUMIN 4.3 11/14/2018   CALCIUM 9.6 06/11/2020   ANIONGAP 10 02/26/2020   Lab Results  Component Value Date   CHOL 166 11/14/2018   Lab Results  Component Value Date   HDL 51 11/14/2018   Lab Results  Component Value Date   LDLCALC 97 11/14/2018   Lab Results  Component Value Date   TRIG 88 11/14/2018   Lab Results  Component Value Date   CHOLHDL 3.3 11/14/2018   Lab Results  Component Value Date   HGBA1C 6.8 (A) 02/21/2020       Assessment & Plan:  Deseree was seen today for edema.  Diagnoses and all orders for this visit:  Colon cancer screening -     Ambulatory referral to Gastroenterology  Primary hypertension Bilateral lower extremity edema decrease CCB side effect Bp finally stable but due to swelling medication adjustment  -     spironolactone (ALDACTONE) 25 MG tablet; Take 1 tablet (25 mg total) by mouth daily. -     amLODipine (NORVASC) 5 MG tablet; Take 1 tablet (5 mg total) by mouth daily.    No orders of the defined types were placed in this encounter.    Kerin Perna, NP

## 2020-06-25 NOTE — Progress Notes (Signed)
Has had swelling in both feet since having a CT scan done

## 2020-06-26 ENCOUNTER — Telehealth (INDEPENDENT_AMBULATORY_CARE_PROVIDER_SITE_OTHER): Payer: Self-pay | Admitting: Primary Care

## 2020-06-26 MED ORDER — AMLODIPINE BESYLATE 5 MG PO TABS: 5.0000 mg | ORAL_TABLET | Freq: Every day | ORAL | 3 refills | Status: DC

## 2020-06-26 MED ORDER — SPIRONOLACTONE 25 MG PO TABS
25.0000 mg | ORAL_TABLET | Freq: Every day | ORAL | 3 refills | Status: DC
Start: 2020-06-26 — End: 2021-01-23

## 2020-06-26 NOTE — Telephone Encounter (Signed)
Copied from [facility] [phone] [id number]. Topic: General - Other >> Jun 26, 2020  3:29 PM Wynetta Emery, [place] C wrote: Reason for CRM: pt called in to speak with CMA. Pt says that she spoke with provider earlier and was told that a few of her medications were changed. Pt is unsure of current medications that she should be taking?    CB: [phone]

## 2020-06-28 NOTE — Telephone Encounter (Signed)
Patient sent mchart communication to PCP.

## 2020-07-05 ENCOUNTER — Encounter: Payer: Self-pay | Admitting: Gastroenterology

## 2020-07-08 DIAGNOSIS — M9905 Segmental and somatic dysfunction of pelvic region: Secondary | ICD-10-CM | POA: Diagnosis not present

## 2020-07-08 DIAGNOSIS — R293 Abnormal posture: Secondary | ICD-10-CM | POA: Diagnosis not present

## 2020-07-08 DIAGNOSIS — M4727 Other spondylosis with radiculopathy, lumbosacral region: Secondary | ICD-10-CM | POA: Diagnosis not present

## 2020-07-08 DIAGNOSIS — M47812 Spondylosis without myelopathy or radiculopathy, cervical region: Secondary | ICD-10-CM | POA: Diagnosis not present

## 2020-07-08 DIAGNOSIS — M9901 Segmental and somatic dysfunction of cervical region: Secondary | ICD-10-CM | POA: Diagnosis not present

## 2020-07-08 DIAGNOSIS — M9903 Segmental and somatic dysfunction of lumbar region: Secondary | ICD-10-CM | POA: Diagnosis not present

## 2020-07-08 DIAGNOSIS — M9902 Segmental and somatic dysfunction of thoracic region: Secondary | ICD-10-CM | POA: Diagnosis not present

## 2020-07-08 DIAGNOSIS — M9904 Segmental and somatic dysfunction of sacral region: Secondary | ICD-10-CM | POA: Diagnosis not present

## 2020-07-10 ENCOUNTER — Ambulatory Visit (HOSPITAL_COMMUNITY): Payer: No Typology Code available for payment source | Attending: Internal Medicine

## 2020-07-10 ENCOUNTER — Other Ambulatory Visit: Payer: Self-pay

## 2020-07-10 DIAGNOSIS — U071 COVID-19: Secondary | ICD-10-CM | POA: Diagnosis not present

## 2020-07-10 LAB — ECHOCARDIOGRAM COMPLETE
Area-P 1/2: 3.12 cm2
S' Lateral: 3.3 cm

## 2020-07-15 DIAGNOSIS — M9904 Segmental and somatic dysfunction of sacral region: Secondary | ICD-10-CM | POA: Diagnosis not present

## 2020-07-15 DIAGNOSIS — M9905 Segmental and somatic dysfunction of pelvic region: Secondary | ICD-10-CM | POA: Diagnosis not present

## 2020-07-15 DIAGNOSIS — R293 Abnormal posture: Secondary | ICD-10-CM | POA: Diagnosis not present

## 2020-07-15 DIAGNOSIS — M4727 Other spondylosis with radiculopathy, lumbosacral region: Secondary | ICD-10-CM | POA: Diagnosis not present

## 2020-07-15 DIAGNOSIS — M9902 Segmental and somatic dysfunction of thoracic region: Secondary | ICD-10-CM | POA: Diagnosis not present

## 2020-07-15 DIAGNOSIS — M47812 Spondylosis without myelopathy or radiculopathy, cervical region: Secondary | ICD-10-CM | POA: Diagnosis not present

## 2020-07-15 DIAGNOSIS — M9903 Segmental and somatic dysfunction of lumbar region: Secondary | ICD-10-CM | POA: Diagnosis not present

## 2020-07-15 DIAGNOSIS — M9901 Segmental and somatic dysfunction of cervical region: Secondary | ICD-10-CM | POA: Diagnosis not present

## 2020-07-16 ENCOUNTER — Ambulatory Visit (INDEPENDENT_AMBULATORY_CARE_PROVIDER_SITE_OTHER): Payer: No Typology Code available for payment source | Admitting: Primary Care

## 2020-07-20 ENCOUNTER — Emergency Department (HOSPITAL_COMMUNITY): Payer: No Typology Code available for payment source

## 2020-07-20 ENCOUNTER — Emergency Department (HOSPITAL_COMMUNITY)
Admission: EM | Admit: 2020-07-20 | Discharge: 2020-07-20 | Disposition: A | Discharge status: Home / Self Care | Payer: No Typology Code available for payment source | Attending: Emergency Medicine | Admitting: Emergency Medicine

## 2020-07-20 ENCOUNTER — Encounter (HOSPITAL_COMMUNITY): Payer: Self-pay

## 2020-07-20 ENCOUNTER — Other Ambulatory Visit: Payer: Self-pay

## 2020-07-20 DIAGNOSIS — F17210 Nicotine dependence, cigarettes, uncomplicated: Secondary | ICD-10-CM | POA: Diagnosis not present

## 2020-07-20 DIAGNOSIS — I10 Essential (primary) hypertension: Secondary | ICD-10-CM | POA: Insufficient documentation

## 2020-07-20 DIAGNOSIS — R600 Localized edema: Secondary | ICD-10-CM | POA: Diagnosis not present

## 2020-07-20 DIAGNOSIS — J40 Bronchitis, not specified as acute or chronic: Secondary | ICD-10-CM | POA: Insufficient documentation

## 2020-07-20 DIAGNOSIS — Z79899 Other long term (current) drug therapy: Secondary | ICD-10-CM | POA: Insufficient documentation

## 2020-07-20 DIAGNOSIS — B349 Viral infection, unspecified: Secondary | ICD-10-CM

## 2020-07-20 DIAGNOSIS — R0602 Shortness of breath: Secondary | ICD-10-CM | POA: Diagnosis not present

## 2020-07-20 DIAGNOSIS — R609 Edema, unspecified: Secondary | ICD-10-CM

## 2020-07-20 DIAGNOSIS — Z7984 Long term (current) use of oral hypoglycemic drugs: Secondary | ICD-10-CM | POA: Insufficient documentation

## 2020-07-20 DIAGNOSIS — Z91040 Latex allergy status: Secondary | ICD-10-CM | POA: Diagnosis not present

## 2020-07-20 DIAGNOSIS — M7989 Other specified soft tissue disorders: Secondary | ICD-10-CM | POA: Diagnosis not present

## 2020-07-20 DIAGNOSIS — R69 Illness, unspecified: Secondary | ICD-10-CM | POA: Diagnosis not present

## 2020-07-20 DIAGNOSIS — R7303 Prediabetes: Secondary | ICD-10-CM | POA: Diagnosis not present

## 2020-07-20 LAB — CBC WITH DIFFERENTIAL/PLATELET
Abs Immature Granulocytes: 0.01 10*3/uL (ref 0.00–0.07)
Basophils Absolute: 0.1 10*3/uL (ref 0.0–0.1)
Basophils Relative: 1 %
Eosinophils Absolute: 0.3 10*3/uL (ref 0.0–0.5)
Eosinophils Relative: 5 %
HCT: 42.7 % (ref 36.0–46.0)
Hemoglobin: 13.5 g/dL (ref 12.0–15.0)
Immature Granulocytes: 0 %
Lymphocytes Relative: 42 %
Lymphs Abs: 3.2 10*3/uL (ref 0.7–4.0)
MCH: 30.5 pg (ref 26.0–34.0)
MCHC: 31.6 g/dL (ref 30.0–36.0)
MCV: 96.6 fL (ref 80.0–100.0)
Monocytes Absolute: 0.6 10*3/uL (ref 0.1–1.0)
Monocytes Relative: 8 %
Neutro Abs: 3.4 10*3/uL (ref 1.7–7.7)
Neutrophils Relative %: 44 %
Platelets: 241 10*3/uL (ref 150–400)
RBC: 4.42 MIL/uL (ref 3.87–5.11)
RDW: 14.5 % (ref 11.5–15.5)
WBC: 7.5 10*3/uL (ref 4.0–10.5)
nRBC: 0 % (ref 0.0–0.2)

## 2020-07-20 LAB — COMPREHENSIVE METABOLIC PANEL
ALT: 20 U/L (ref 0–44)
AST: 19 U/L (ref 15–41)
Albumin: 4.2 g/dL (ref 3.5–5.0)
Alkaline Phosphatase: 61 U/L (ref 38–126)
Anion gap: 7 (ref 5–15)
BUN: 17 mg/dL (ref 6–20)
CO2: 30 mmol/L (ref 22–32)
Calcium: 9.4 mg/dL (ref 8.9–10.3)
Chloride: 105 mmol/L (ref 98–111)
Creatinine, Ser: 0.96 mg/dL (ref 0.44–1.00)
GFR, Estimated: >60 mL/min (ref >60)
Glucose, Bld: 121 mg/dL — ABNORMAL HIGH (ref 70–99)
Potassium: 4.2 mmol/L (ref 3.5–5.1)
Sodium: 142 mmol/L (ref 135–145)
Total Bilirubin: 1.3 mg/dL — ABNORMAL HIGH (ref 0.3–1.2)
Total Protein: 7.7 g/dL (ref 6.5–8.1)

## 2020-07-20 LAB — D-DIMER, QUANTITATIVE: D-Dimer, Quant: 0.45 ug/mL-FEU (ref 0.00–0.50)

## 2020-07-20 LAB — TROPONIN I (HIGH SENSITIVITY): Troponin I (High Sensitivity): 4 ng/L (ref <18)

## 2020-07-20 LAB — BRAIN NATRIURETIC PEPTIDE: B Natriuretic Peptide: 22.7 pg/mL (ref 0.0–100.0)

## 2020-07-20 MED ORDER — ALBUTEROL SULFATE HFA 108 (90 BASE) MCG/ACT IN AERS
2.0000 | INHALATION_SPRAY | Freq: Once | RESPIRATORY_TRACT | Status: AC
  Administered 2020-07-20: 2 via RESPIRATORY_TRACT
  Filled 2020-07-20: qty 6.7

## 2020-07-20 MED ORDER — FUROSEMIDE 20 MG PO TABS: 20.0000 mg | ORAL_TABLET | Freq: Every day | ORAL | 0 refills | Status: DC

## 2020-07-20 MED ORDER — FUROSEMIDE 40 MG PO TABS
40.0000 mg | ORAL_TABLET | Freq: Once | ORAL | Status: AC
  Administered 2020-07-20: 40 mg via ORAL
  Filled 2020-07-20: qty 1

## 2020-07-20 MED ORDER — ACETAMINOPHEN 325 MG PO TABS
650.0000 mg | ORAL_TABLET | Freq: Once | ORAL | Status: AC
  Administered 2020-07-20: 650 mg via ORAL
  Filled 2020-07-20: qty 2

## 2020-07-20 NOTE — ED Provider Notes (Signed)
[facility] DEPT Provider Note   CSN: [id number] Arrival date & time: 07/20/20  1535     History Chief Complaint  Patient presents with  . Leg Swelling  . Headache    Catherine [name] is a 50 y.o. female presenting for evaluation of leg swelling, lightheadedness, cough, shortness of breath.  Patient states she has had bilateral leg swelling since Tuesday, which is gradually worsened.  She reports pain mostly in her feet and ankles due to the swelling, but no pain in her calves.  Is not worse on one side.  She denies associated orthopnea.  She reports history of similar several months ago, but that resolved after few weeks.  She was recently changed from amlodipine to spironolactone due to leg swelling.  Patient states today she developed lightheadedness when she stands up, shortness of breath with exertion and she has a mild cough.  She reports her grandson has a cold, but has tested negative for Covid.  She denies fevers, chills, chest pain, nausea vomiting, abdominal pain, urinary symptoms, abnormal bowel movements.  No recent change in diet.  She reports a history of anxiety, but states her symptoms are different than her normal panic attacks.  Additional history taken chart reviewed.  Patient with a history of anxiety, bronchitis, depression, hypertension, sleep apnea, diabetes, obesity  HPI     Past Medical History:  Diagnosis Date  . Anxiety   . Bronchitis   . Depression   . Hypertension   . Sleep apnea     Patient Active Problem List   Diagnosis Date Noted  . Generalized anxiety disorder 05/09/2018  . Prediabetes 11/09/2017  . Depression with anxiety 08/24/2017  . Hypertension 07/29/2016  . Obesity with serious comorbidity 07/29/2016    Past Surgical History:  Procedure Laterality Date  . BUNIONECTOMY  2012  . CESAREAN SECTION     X 2  . LAVH  2009   Leiomyoma and menorrhagia  . MYOMECTOMY  2005     OB History    Gravida  4    Para  2   Term      Preterm      AB  2   Living  2     SAB  2   IAB      Ectopic      Multiple      Live Births              Family History  Problem Relation Age of Onset  . Hypertension Mother   . Asthma Mother   . Hypertension Father   . Heart attack Father        died at 2 of an MI  . Hypertension Sister   . Asthma Daughter     Social History   Tobacco Use  . Smoking status: Current Every Day Smoker    Packs/day: 0.50    Types: Cigarettes  . Smokeless tobacco: Never Used  Substance Use Topics  . Alcohol use: Yes    Alcohol/week: 0.0 standard drinks    Comment: socially  . Drug use: No    Home Medications Prior to Admission medications   Medication Sig Start Date End Date Taking? Authorizing Provider  furosemide (LASIX) 20 MG tablet Take 1 tablet (20 mg total) by mouth daily. 07/20/20  Yes Ty Oshima, PA-C  albuterol (VENTOLIN HFA) 108 (90 Base) MCG/ACT inhaler INHALE 1-2 PUFFS INTO THE LUNGS EVERY 6 (SIX) HOURS AS NEEDED FOR WHEEZING OR SHORTNESS OF  BREATH. 09/20/19   Kerin Perna, NP  amLODipine (NORVASC) 5 MG tablet Take 1 tablet (5 mg total) by mouth daily. 06/26/20   Kerin Perna, NP  busPIRone (BUSPAR) 7.5 MG tablet TAKE 1 TABLET (7.5 MG TOTAL) BY MOUTH 3 (THREE) TIMES DAILY. 04/07/20   Kerin Perna, NP  celecoxib (CELEBREX) 200 MG capsule Take 1 capsule (200 mg total) by mouth 2 (two) times daily as needed. 03/01/20   Hilts, Legrand Como, MD  escitalopram (LEXAPRO) 20 MG tablet Take 1 tablet (20 mg total) by mouth daily. 04/08/20   Kerin Perna, NP  fluticasone (FLONASE) 50 MCG/ACT nasal spray Place into both nostrils. 01/25/20   [provider]  glimepiride (AMARYL) 2 MG tablet Take 1 tablet (2 mg total) by mouth daily before breakfast. 05/18/19   Kerin Perna, NP  hydrochlorothiazide (HYDRODIURIL) 25 MG tablet TAKE 1 TABLET BY MOUTH DAILY. TAKE ON TABLET IN THE MORNING. 06/01/20   Kerin Perna, NP   losartan (COZAAR) 50 MG tablet TAKE 2 TABLETS BY MOUTH EVERY DAY 05/08/20   Kerin Perna, NP  methocarbamol (ROBAXIN) 500 MG tablet Take 1 tablet (500 mg total) by mouth every 8 (eight) hours as needed for muscle spasms. 02/27/20   Petrucelli, Samantha R, PA-C  metoprolol tartrate (LOPRESSOR) 100 MG tablet Take 1 tablet (100 mg total) two hours prior to CT scan. 04/30/20   Sueanne Margarita, MD  pravastatin (PRAVACHOL) 40 MG tablet Take 1 tablet (40 mg total) by mouth daily. 05/18/19   Kerin Perna, NP  spironolactone (ALDACTONE) 25 MG tablet Take 1 tablet (25 mg total) by mouth daily. 06/26/20   Kerin Perna, NP  tiZANidine (ZANAFLEX) 2 MG tablet TAKE 1-2 TABLETS (2-4 MG TOTAL) BY MOUTH EVERY 6 (SIX) HOURS AS NEEDED FOR MUSCLE SPASMS. 04/03/20   Hilts, Legrand Como, MD    Allergies    Metformin and related and Latex  Review of Systems   Review of Systems  Respiratory: Positive for cough and shortness of breath.   Cardiovascular: Positive for leg swelling.  Neurological: Positive for light-headedness and headaches.  All other systems reviewed and are negative.   Physical Exam Updated Vital Signs BP 132/75 (BP Location: Left Arm)   Pulse 79   Temp 98.1 F (36.7 C) (Oral)   Resp 18   Ht 5\' 7"  (1.702 m)   Wt (!) 141.5 kg   SpO2 100%   BMI 48.87 kg/m   Physical Exam Vitals and nursing note reviewed.  Constitutional:      General: She is not in acute distress.    Appearance: She is well-developed.     Comments: Resting in the bed in NAD  HENT:     Head: Normocephalic and atraumatic.  Eyes:     Extraocular Movements: Extraocular movements intact.     Conjunctiva/sclera: Conjunctivae normal.     Pupils: Pupils are equal, round, and reactive to light.  Cardiovascular:     Rate and Rhythm: Normal rate and regular rhythm.     Pulses: Normal pulses.  Pulmonary:     Effort: Pulmonary effort is normal. No respiratory distress.     Breath sounds: Wheezing present.      Comments: Expiratory wheezing.  Speaking full sentences.  Sats stable on room air. Abdominal:     General: There is no distension.     Palpations: Abdomen is soft. There is no mass.     Tenderness: There is no abdominal tenderness. There is no  guarding or rebound.  Musculoskeletal:        General: Normal range of motion.     Cervical back: Normal range of motion and neck supple.     Right lower leg: Edema present.     Left lower leg: Edema present.     Comments: Mild bilateral edema extending to mid calf.  No erythema or warmth.  Pedal pulses 2+ bilaterally.  Skin:    General: Skin is warm and dry.     Capillary Refill: Capillary refill takes less than 2 seconds.  Neurological:     Mental Status: She is alert and oriented to person, place, and time.     ED Results / Procedures / Treatments   Labs (all labs ordered are listed, but only abnormal results are displayed) Labs Reviewed  COMPREHENSIVE METABOLIC PANEL - Abnormal; Notable for the following components:      Result Value   Glucose, Bld 121 (*)    Total Bilirubin 1.3 (*)    All other components within normal limits  BRAIN NATRIURETIC PEPTIDE  CBC WITH DIFFERENTIAL/PLATELET  D-DIMER, QUANTITATIVE  TROPONIN I (HIGH SENSITIVITY)    EKG None  Radiology DG Chest 2 View  Result Date: 07/20/2020 CLINICAL DATA:  Shortness of breath.  Bilateral leg swelling. EXAM: CHEST - 2 VIEW COMPARISON:  February 26, 2020 FINDINGS: The cardiomediastinal silhouette is normal. No pneumothorax. Chronic interstitial coarsening and bronchitic change. No new nodule or mass. No focal infiltrate. No overt edema. IMPRESSION: No active cardiopulmonary disease. Chronic interstitial coarsening and bronchitic change. Electronically Signed   By: Dorise Bullion III M.D   On: 07/20/2020 16:49    Procedures Procedures   Medications Ordered in ED Medications  acetaminophen (TYLENOL) tablet 650 mg (650 mg Oral Given 07/20/20 1852)  furosemide (LASIX)  tablet 40 mg (40 mg Oral Given 07/20/20 1853)  albuterol (VENTOLIN HFA) 108 (90 Base) MCG/ACT inhaler 2 puff (2 puffs Inhalation Given 07/20/20 1853)    ED Course  I have reviewed the triage vital signs and the nursing notes.  Pertinent labs & imaging results that were available during my care of the patient were reviewed by me and considered in my medical decision making (see chart for details).    MDM Rules/Calculators/A&P                          Patient presenting for evaluation of leg swelling, shortness of breath, lightheadedness.  On exam, patient is nontoxic.  No calf tenderness and swelling of the upper leg.  Low suspicion for DVT, patient without risk factors.  Unable to use PERC criteria due to age.  Patient with wheezing on exam and chest x-ray obtained in triage consistent with bronchitis.  In the setting of recent contact with a cold, patient likely has a viral illness.  Will treat symptomatically with albuterol and Tylenol and reassess.  Check orthostatics and ensure no hypoxia or significant tachycardia with ambulation.  Labs obtained from triage interpreted by me, overall reassuring.  BNP negative.  Troponin negative.  Electrolytes are stable.  No clear cause for patient's peripheral edema.  Will trial a small dose of Lasix and have patient follow-up closely with PCP.  Patient report significant improvement after albuterol and Tylenol.  When she ambulated, sats dropped to 91%.  She continued to feel slightly short of breath with ambulation.  As such, obtain D-dimer to ensure no PE, though low suspicion.  Dimer negative.  Discussed findings with patient.  Discussed continued symptomatic treatment as well as close follow-up with PCP.  At this time, patient appears safe for discharge.  Return precautions given.  Patient states she understands and agrees to plan.    Final Clinical Impression(s) / ED Diagnoses Final diagnoses:  Peripheral edema  Bronchitis  Viral illness    Rx / DC  Orders ED Discharge Orders         Ordered    furosemide (LASIX) 20 MG tablet  Daily        07/20/20 2110           Franchot Heidelberg, PA-C 07/20/20 2146    Malvin Johns, MD 07/21/20 1510

## 2020-07-20 NOTE — Discharge Instructions (Signed)
Take Lasix daily in the morning for the next 3 days to help with peripheral edema.  This will increase the amount that you urinate, so I recommend you take it in the morning instead of at night. Use albuterol every 4 hours while awake for the next 2 days.  After this, as needed for shortness of breath, chest tightness, wheezing, coughing. Continue taking all other home medications as prescribed. Eat a low-salt diet. Try and keep your feet elevated and use compression socks to help with swelling. Follow-up with your primary care doctor scheduled appointment on Monday. Return to emergency room with any new, worsening, concerning symptoms

## 2020-07-20 NOTE — ED Triage Notes (Addendum)
Pt reports bilateral leg swelling over the last week. Pt endorses headache that began this morning. Pt states when EMS came to her house her BP was 180/100. Pt reports hx of HTN and recent change in medications.

## 2020-07-20 NOTE — ED Notes (Signed)
Pt ambulated around room and O2 dropped to 91% normal gait

## 2020-07-20 NOTE — ED Triage Notes (Signed)
Emergency Medicine Provider Triage Evaluation Note  Catherine [name] , a 50 y.o. female  was evaluated in triage.  Pt complains of dizziness, lightheadedness, and leg swelling.  Patient endorses his leg swelling started on Tuesday and has progressing gotten worse, she denies any recent trauma to the area, denies unilateral leg swelling, has no history of PEs or DVTs, currently not on hormone therapy.  Patient endorses that starting today when she goes from a seated position to a standing  position she feels lightheaded as well as has some shortness of breath.  It is not associated with chest pain, becoming diaphoretic, she denies change in vision, paresthesias or weakness the upper lower extremities.  Review of Systems  Positive: Lightheadedness and dizziness, shortness of breath, bilateral leg swelling.  Negative: Patient denies headaches, fevers, chills, chest pain, abdominal pain, nausea, vomiting, diarrhea.  Physical Exam  BP 121/88 (BP Location: Left Arm)   Pulse 100   Temp 98.1 F (36.7 C) (Oral)   Resp 20   Ht 5\' 7"  (1.702 m)   Wt (!) 141.5 kg   SpO2 100%   BMI 48.87 kg/m  Gen:   Awake, no distress   HEENT:  Atraumatic Resp:  Normal effort Cardiac:  Normal rate  Abd:   Nondistended, nontender MSK:   Moves extremities without difficulty, 1+ pitting edema no unilateral leg swelling present Neuro:  Speech clear   Medical Decision Making  Medically screening exam initiated at 4:06 PM.  Appropriate orders placed.  Catherine [name] was informed that the remainder of the evaluation will be completed by another provider, this initial triage assessment does not replace that evaluation, and the importance of remaining in the ED until their evaluation is complete.  Clinical Impression  Patient presents with dizziness, lightheadedness, shortness of breath, and leg swelling.  Will obtain basic lab work for further evaluation   Catherine [name], Catherine [name] 07/20/20 1626

## 2020-07-22 ENCOUNTER — Ambulatory Visit (INDEPENDENT_AMBULATORY_CARE_PROVIDER_SITE_OTHER): Payer: No Typology Code available for payment source | Admitting: Primary Care

## 2020-07-22 ENCOUNTER — Other Ambulatory Visit: Payer: Self-pay

## 2020-07-22 ENCOUNTER — Encounter (INDEPENDENT_AMBULATORY_CARE_PROVIDER_SITE_OTHER): Payer: Self-pay | Admitting: Primary Care

## 2020-07-22 VITALS — BP 135/88 | HR 57 | Temp 97.3°F | Ht 67.0 in | Wt 314.2 lb

## 2020-07-22 DIAGNOSIS — R69 Illness, unspecified: Secondary | ICD-10-CM | POA: Diagnosis not present

## 2020-07-22 DIAGNOSIS — F418 Other specified anxiety disorders: Secondary | ICD-10-CM

## 2020-07-22 DIAGNOSIS — Z09 Encounter for follow-up examination after completed treatment for conditions other than malignant neoplasm: Secondary | ICD-10-CM | POA: Diagnosis not present

## 2020-07-22 DIAGNOSIS — Z716 Tobacco abuse counseling: Secondary | ICD-10-CM

## 2020-07-22 DIAGNOSIS — R600 Localized edema: Secondary | ICD-10-CM

## 2020-07-22 DIAGNOSIS — I10 Essential (primary) hypertension: Secondary | ICD-10-CM | POA: Diagnosis not present

## 2020-07-22 DIAGNOSIS — F411 Generalized anxiety disorder: Secondary | ICD-10-CM | POA: Diagnosis not present

## 2020-07-22 DIAGNOSIS — Z760 Encounter for issue of repeat prescription: Secondary | ICD-10-CM | POA: Diagnosis not present

## 2020-07-22 MED ORDER — BUSPIRONE HCL 7.5 MG PO TABS: 7.5000 mg | ORAL_TABLET | Freq: Three times a day (TID) | ORAL | 1 refills | Status: DC

## 2020-07-22 MED ORDER — ESCITALOPRAM OXALATE 20 MG PO TABS: 20.0000 mg | ORAL_TABLET | Freq: Every day | ORAL | 1 refills | Status: DC

## 2020-07-22 MED ORDER — NICOTINE 7 MG/24HR TD PT24: 7.0000 mg | MEDICATED_PATCH | Freq: Every day | TRANSDERMAL | 0 refills | Status: DC

## 2020-07-22 MED ORDER — NICOTINE 14 MG/24HR TD PT24: 14.0000 mg | MEDICATED_PATCH | Freq: Every day | TRANSDERMAL | 0 refills | Status: DC

## 2020-07-22 MED ORDER — NICOTINE 21 MG/24HR TD PT24: 21.0000 mg | MEDICATED_PATCH | Freq: Every day | TRANSDERMAL | 0 refills | Status: DC

## 2020-07-22 NOTE — Patient Instructions (Signed)
Edema  Edema is when you have too much fluid in your body or under your skin. Edema may make your legs, feet, and ankles swell up. Swelling is also common in looser tissues, like around your eyes. This is a common condition. It gets more common as you get older. There are many possible causes of edema. Eating too much salt (sodium) and being on your feet or sitting for a long time can cause edema in your legs, feet, and ankles. Hot weather may make edema worse. Edema is usually painless. Your skin may look swollen or shiny. Follow these instructions at home:  Keep the swollen body part raised (elevated) above the level of your heart when you are sitting or lying down.  Do not sit still or stand for a long time.  Do not wear tight clothes. Do not wear garters on your upper legs.  Exercise your legs. This can help the swelling go down.  Wear elastic bandages or support stockings as told by your doctor.  Eat a low-salt (low-sodium) diet to reduce fluid as told by your doctor.  Depending on the cause of your swelling, you may need to limit how much fluid you drink (fluid restriction).  Take over-the-counter and prescription medicines only as told by your doctor. Contact a doctor if:  Treatment is not working.  You have heart, liver, or kidney disease and have symptoms of edema.  You have sudden and unexplained weight gain. Get help right away if:  You have shortness of breath or chest pain.  You cannot breathe when you lie down.  You have pain, redness, or warmth in the swollen areas.  You have heart, liver, or kidney disease and get edema all of a sudden.  You have a fever and your symptoms get worse all of a sudden. Summary  Edema is when you have too much fluid in your body or under your skin.  Edema may make your legs, feet, and ankles swell up. Swelling is also common in looser tissues, like around your eyes.  Raise (elevate) the swollen body part above the level of your  heart when you are sitting or lying down.  Follow your doctor's instructions about diet and how much fluid you can drink (fluid restriction). This information is not intended to replace advice given to you by your health care provider. Make sure you discuss any questions you have with your health care provider. Document Revised: 01/31/2020 Document Reviewed: 01/31/2020 Elsevier Patient Education  2021 [employer]  

## 2020-07-22 NOTE — Progress Notes (Signed)
[name]     [name] hypertension evaluation, on previous visit medication was adjusted to include decrease to amlodipine 5mg  due to lower extremity enema. Recently in the ED 07/20/20 on  for Peripheral edema and Bronchitis and Viral illness. Patient reports adherence with medications.  Dietary habits include: low sodium but eats out at times  Exercise habits include No Family / Social history: parental father MI  Past Medical History:  Diagnosis Date  . Anxiety   . Bronchitis   . Depression   . Hypertension   . Sleep apnea      Allergies  Allergen Reactions  . Metformin And Related Other (See Comments)    Abdominal discomfort.  . Latex Rash      Current Outpatient Medications on File Prior to Visit  Medication Sig Dispense Refill  . albuterol (VENTOLIN HFA) 108 (90 Base) MCG/ACT inhaler INHALE 1-2 PUFFS INTO THE LUNGS EVERY 6 (SIX) HOURS AS NEEDED FOR WHEEZING OR SHORTNESS OF BREATH. 18 g 1  . amLODipine (NORVASC) 5 MG tablet Take 1 tablet (5 mg total) by mouth daily. 90 tablet 3  . busPIRone (BUSPAR) 7.5 MG tablet TAKE 1 TABLET (7.5 MG TOTAL) BY MOUTH 3 (THREE) TIMES DAILY. 270 tablet 1  . celecoxib (CELEBREX) 200 MG capsule Take 1 capsule (200 mg total) by mouth 2 (two) times daily as needed. 60 capsule 6  . escitalopram (LEXAPRO) 20 MG tablet Take 1 tablet (20 mg total) by mouth daily. 90 tablet 1  . fluticasone (FLONASE) 50 MCG/ACT nasal spray Place into both nostrils.    . furosemide (LASIX) 20 MG tablet Take 1 tablet (20 mg total) by mouth daily. 3 tablet 0  . glimepiride (AMARYL) 2 MG tablet Take 1 tablet (2 mg total) by mouth daily before breakfast. 90 tablet 1  . hydrochlorothiazide (HYDRODIURIL) 25 MG tablet TAKE 1 TABLET BY MOUTH DAILY. TAKE ON TABLET IN THE MORNING. 90 tablet 0  . losartan (COZAAR) 50 MG tablet TAKE 2 TABLETS BY MOUTH EVERY DAY 180 tablet 0  . methocarbamol (ROBAXIN) 500 MG tablet Take 1 tablet (500 mg total) by mouth  every 8 (eight) hours as needed for muscle spasms. 15 tablet 0  . metoprolol tartrate (LOPRESSOR) 100 MG tablet Take 1 tablet (100 mg total) two hours prior to CT scan. 1 tablet 0  . pravastatin (PRAVACHOL) 40 MG tablet Take 1 tablet (40 mg total) by mouth daily. 90 tablet 1  . spironolactone (ALDACTONE) 25 MG tablet Take 1 tablet (25 mg total) by mouth daily. 90 tablet 3  . tiZANidine (ZANAFLEX) 2 MG tablet TAKE 1-2 TABLETS (2-4 MG TOTAL) BY MOUTH EVERY 6 (SIX) HOURS AS NEEDED FOR MUSCLE SPASMS. 60 tablet 1   No current facility-administered medications on file prior to visit.   Review of Systems  Respiratory: Positive for shortness of breath.        With excertion   Cardiovascular: Positive for leg swelling.  Neurological: Positive for headaches.       When she went to ED she woke up with one   Psychiatric/Behavioral: Positive for depression and memory loss. The patient is nervous/anxious.   All other systems reviewed and are negative.  BP Readings from Last 3 Encounters:  07/22/20 135/88  07/20/20 132/75  06/25/20 137/84      Component Value Date/Time   NA 142 07/20/2020 1617   NA 140 06/11/2020 1420   K 4.2 07/20/2020 1617   CL 105 07/20/2020 1617   CO2  30 07/20/2020 1617   GLUCOSE 121 (H) 07/20/2020 1617   BUN 17 07/20/2020 1617   BUN 13 06/11/2020 1420   CREATININE 0.96 07/20/2020 1617   CREATININE 0.81 05/17/2014 1634   CALCIUM 9.4 07/20/2020 1617   GFRNONAA >60 07/20/2020 1617   GFRAA 90 06/11/2020 1420   BP 135/88 (BP Location: Right Arm, Patient Position: Sitting, Cuff Size: Large)   Pulse (!) 57   Temp (!) 97.3 F (36.3 C) (Temporal)   Ht 5\' 7"  (1.702 m)   Wt (!) 314 lb 3.2 oz (142.5 kg)   SpO2 92%   BMI 49.21 kg/m  General Appearance: Well nourished, morbid obese in no apparent distress. Eyes: PERRLA, EOMs, conjunctiva no swelling or erythema Sinuses: No Frontal/maxillary tenderness ENT/Mouth: Ext aud canals clear.  Tonsils not swollen or erythematous.  Hearing normal.  Neck: Supple, thyroid normal.  Respiratory: Respiratory effort normal, BS equal bilaterally without rales, rhonchi, wheezing or stridor.  Cardio: RRR with no MRGs. Brisk peripheral pulses without edema.  Abdomen: Soft, + BS.  Non tender, no guarding, rebound, hernias, masses. Lymphatics: Non tender without lymphadenopathy.  Musculoskeletal: Full ROM, 5/5 strength, normal gait.  Skin: Warm, dry without rashes, lesions, ecchymosis.  Neuro:  no cerebellar symptoms. Sensation intact.  Psych: Awake and oriented X 3, normal affect, Insight and Judgment appropriate.    The 10-year Catherine [name] risk score Catherine [name]., et al., 2013) is: 17.7%   Values used to calculate the score:     Age: 50 years     Sex: Female     Is Non-Hispanic African American: Yes     Diabetic: Yes     Tobacco smoker: Yes     Systolic Blood Pressure: 456 mmHg     Is BP treated: Yes     HDL Cholesterol: 51 mg/dL     Total Cholesterol: 166 mg/dL  A/P:Catherine [name] was seen today for blood pressure check.  Diagnoses and all orders for this visit:  Essential hypertension Hypertension longstanding currently spirolactone , HCTZ and losartan  on current medications. BP Goal = 130/80  mmHg Close to goal . Patient is adherent with current medications.  -Continued resume amlodipine . Discontinue losartan drug drug interaction Close to goal  For Bp controlled 135/88 -F/u labs ordered - none  -Counseled on lifestyle modifications for blood pressure control including reduced dietary sodium, increased exercise, adequate sleep  Hospital discharge follow-up F/u with PCP bronchitis viral and lower extremity edema  Medication refill BuSpar 7.5 3 times daily Lexapro 20 mg at bedtime  Generalized anxiety disorder Managed with Buspar but at times life situations can /does anxiety   Depression with anxiety Managed with SSRI Lexapro   Tobacco abuse counseling Nicotine affect every organ in the body second leading cause of  death.  Increased risk for lung cancer and other respiratory diseases recommend cessation.   Discussed smoking cessation she has agreed to trying Nicoderm patches  Prescribed Nicoderm patches   Localized edema Given lasix in ED on spirolactone recommend compression hose/stockings   Additional time for review of ED visits, labs and imaging   Kerin Perna

## 2020-07-24 ENCOUNTER — Telehealth (INDEPENDENT_AMBULATORY_CARE_PROVIDER_SITE_OTHER): Payer: Self-pay

## 2020-07-24 DIAGNOSIS — M9905 Segmental and somatic dysfunction of pelvic region: Secondary | ICD-10-CM | POA: Diagnosis not present

## 2020-07-24 DIAGNOSIS — M47812 Spondylosis without myelopathy or radiculopathy, cervical region: Secondary | ICD-10-CM | POA: Diagnosis not present

## 2020-07-24 DIAGNOSIS — R293 Abnormal posture: Secondary | ICD-10-CM | POA: Diagnosis not present

## 2020-07-24 DIAGNOSIS — M9902 Segmental and somatic dysfunction of thoracic region: Secondary | ICD-10-CM | POA: Diagnosis not present

## 2020-07-24 DIAGNOSIS — M9901 Segmental and somatic dysfunction of cervical region: Secondary | ICD-10-CM | POA: Diagnosis not present

## 2020-07-24 DIAGNOSIS — M9904 Segmental and somatic dysfunction of sacral region: Secondary | ICD-10-CM | POA: Diagnosis not present

## 2020-07-24 DIAGNOSIS — M4727 Other spondylosis with radiculopathy, lumbosacral region: Secondary | ICD-10-CM | POA: Diagnosis not present

## 2020-07-24 DIAGNOSIS — M9903 Segmental and somatic dysfunction of lumbar region: Secondary | ICD-10-CM | POA: Diagnosis not present

## 2020-07-24 NOTE — Telephone Encounter (Signed)
Copied from [place] [id number]. Topic: General - Other >> Jul 23, 2020 11:47 AM Pawlus, Brayton Layman A wrote: Reason for CRM: Pt wanted a call back from [facility] or her nurse to go over how to take her meds. Pt stated she was confused on what she is supposed to be taking. Please advise. >> Jul 23, 2020  4:29 PM Mcneil, Jacinto Reap wrote: Pt stated she has not taken any blood pressure medication today so she really would like either Sharyn Lull or her nurse to return her call

## 2020-07-25 DIAGNOSIS — M47812 Spondylosis without myelopathy or radiculopathy, cervical region: Secondary | ICD-10-CM | POA: Diagnosis not present

## 2020-07-25 DIAGNOSIS — M9902 Segmental and somatic dysfunction of thoracic region: Secondary | ICD-10-CM | POA: Diagnosis not present

## 2020-07-25 DIAGNOSIS — M9905 Segmental and somatic dysfunction of pelvic region: Secondary | ICD-10-CM | POA: Diagnosis not present

## 2020-07-25 DIAGNOSIS — M4727 Other spondylosis with radiculopathy, lumbosacral region: Secondary | ICD-10-CM | POA: Diagnosis not present

## 2020-07-25 DIAGNOSIS — R293 Abnormal posture: Secondary | ICD-10-CM | POA: Diagnosis not present

## 2020-07-25 DIAGNOSIS — M9903 Segmental and somatic dysfunction of lumbar region: Secondary | ICD-10-CM | POA: Diagnosis not present

## 2020-07-25 DIAGNOSIS — M9901 Segmental and somatic dysfunction of cervical region: Secondary | ICD-10-CM | POA: Diagnosis not present

## 2020-07-25 DIAGNOSIS — M9904 Segmental and somatic dysfunction of sacral region: Secondary | ICD-10-CM | POA: Diagnosis not present

## 2020-07-26 ENCOUNTER — Encounter (INDEPENDENT_AMBULATORY_CARE_PROVIDER_SITE_OTHER): Payer: Self-pay | Admitting: Primary Care

## 2020-07-26 ENCOUNTER — Other Ambulatory Visit (INDEPENDENT_AMBULATORY_CARE_PROVIDER_SITE_OTHER): Payer: Self-pay | Admitting: Primary Care

## 2020-07-26 DIAGNOSIS — I10 Essential (primary) hypertension: Secondary | ICD-10-CM

## 2020-07-26 MED ORDER — AMLODIPINE BESYLATE 5 MG PO TABS: 5.0000 mg | ORAL_TABLET | Freq: Every day | ORAL | 3 refills | Status: DC

## 2020-07-26 NOTE — Telephone Encounter (Signed)
Question  about taking amlodipine started back 5mg .  Feet were swollen after stopping medication

## 2020-07-29 ENCOUNTER — Other Ambulatory Visit (INDEPENDENT_AMBULATORY_CARE_PROVIDER_SITE_OTHER): Payer: Self-pay | Admitting: Primary Care

## 2020-07-29 DIAGNOSIS — Z760 Encounter for issue of repeat prescription: Secondary | ICD-10-CM

## 2020-07-29 DIAGNOSIS — M9905 Segmental and somatic dysfunction of pelvic region: Secondary | ICD-10-CM | POA: Diagnosis not present

## 2020-07-29 DIAGNOSIS — M9904 Segmental and somatic dysfunction of sacral region: Secondary | ICD-10-CM | POA: Diagnosis not present

## 2020-07-29 DIAGNOSIS — R293 Abnormal posture: Secondary | ICD-10-CM | POA: Diagnosis not present

## 2020-07-29 DIAGNOSIS — M47812 Spondylosis without myelopathy or radiculopathy, cervical region: Secondary | ICD-10-CM | POA: Diagnosis not present

## 2020-07-29 DIAGNOSIS — M4727 Other spondylosis with radiculopathy, lumbosacral region: Secondary | ICD-10-CM | POA: Diagnosis not present

## 2020-07-29 DIAGNOSIS — M9903 Segmental and somatic dysfunction of lumbar region: Secondary | ICD-10-CM | POA: Diagnosis not present

## 2020-07-29 DIAGNOSIS — M9902 Segmental and somatic dysfunction of thoracic region: Secondary | ICD-10-CM | POA: Diagnosis not present

## 2020-07-29 DIAGNOSIS — M9901 Segmental and somatic dysfunction of cervical region: Secondary | ICD-10-CM | POA: Diagnosis not present

## 2020-07-31 DIAGNOSIS — M9904 Segmental and somatic dysfunction of sacral region: Secondary | ICD-10-CM | POA: Diagnosis not present

## 2020-07-31 DIAGNOSIS — M9905 Segmental and somatic dysfunction of pelvic region: Secondary | ICD-10-CM | POA: Diagnosis not present

## 2020-07-31 DIAGNOSIS — M9901 Segmental and somatic dysfunction of cervical region: Secondary | ICD-10-CM | POA: Diagnosis not present

## 2020-07-31 DIAGNOSIS — M47812 Spondylosis without myelopathy or radiculopathy, cervical region: Secondary | ICD-10-CM | POA: Diagnosis not present

## 2020-07-31 DIAGNOSIS — R293 Abnormal posture: Secondary | ICD-10-CM | POA: Diagnosis not present

## 2020-07-31 DIAGNOSIS — M4727 Other spondylosis with radiculopathy, lumbosacral region: Secondary | ICD-10-CM | POA: Diagnosis not present

## 2020-07-31 DIAGNOSIS — M9903 Segmental and somatic dysfunction of lumbar region: Secondary | ICD-10-CM | POA: Diagnosis not present

## 2020-07-31 DIAGNOSIS — M9902 Segmental and somatic dysfunction of thoracic region: Secondary | ICD-10-CM | POA: Diagnosis not present

## 2020-08-03 ENCOUNTER — Other Ambulatory Visit (INDEPENDENT_AMBULATORY_CARE_PROVIDER_SITE_OTHER): Payer: Self-pay | Admitting: Primary Care

## 2020-08-03 DIAGNOSIS — Z760 Encounter for issue of repeat prescription: Secondary | ICD-10-CM

## 2020-08-03 NOTE — Telephone Encounter (Signed)
Requested Prescriptions  Pending Prescriptions Disp Refills  . hydrochlorothiazide (HYDRODIURIL) 25 MG tablet [Pharmacy Med Name: HYDROCHLOROTHIAZIDE 25 MG TAB] 90 tablet 0    Sig: TAKE 1 TABLET BY MOUTH DAILY. TAKE ON TABLET IN THE MORNING.     Cardiovascular: Diuretics - Thiazide Passed - 08/03/2020  2:38 PM      Passed - Ca in normal range and within 360 days    Calcium  Date Value Ref Range Status  07/20/2020 9.4 8.9 - 10.3 mg/dL Final   Calcium, Ion  Date Value Ref Range Status  03/01/2013 1.05 (L) 1.12 - 1.23 mmol/L Final         Passed - Cr in normal range and within 360 days    Creat  Date Value Ref Range Status  05/17/2014 0.81 0.50 - 1.10 mg/dL Final   Creatinine, Ser  Date Value Ref Range Status  07/20/2020 0.96 0.44 - 1.00 mg/dL Final         Passed - K in normal range and within 360 days    Potassium  Date Value Ref Range Status  07/20/2020 4.2 3.5 - 5.1 mmol/L Final         Passed - Na in normal range and within 360 days    Sodium  Date Value Ref Range Status  07/20/2020 142 135 - 145 mmol/L Final  06/11/2020 140 134 - 144 mmol/L Final         Passed - Last BP in normal range    BP Readings from Last 1 Encounters:  07/22/20 135/88         Passed - Valid encounter within last 6 months    Recent Outpatient Visits          1 week ago Hospital discharge follow-up   [place], [place], NP   1 month ago Colon cancer screening   [place], Michelle P, NP   2 months ago Depression with anxiety   [place] Juluis Mire P, NP   5 months ago Type 2 diabetes mellitus without complication, without long-term current use of insulin ([place])   [place], [place], NP   1 year ago Depression, unspecified depression type   [place], [place], NP      Future Appointments            In 2 weeks  Oletta Lamas, [place] Cage, NP [place]           . glimepiride (AMARYL) 2 MG tablet [Pharmacy Med Name: GLIMEPIRIDE 2 MG TABLET] 90 tablet 0    Sig: TAKE 1 TABLET BY MOUTH DAILY BEFORE BREAKFAST.     Endocrinology:  Diabetes - Sulfonylureas Passed - 08/03/2020  2:38 PM      Passed - HBA1C is between 0 and 7.9 and within 180 days    Hemoglobin A1C  Date Value Ref Range Status  02/21/2020 6.8 (A) 4.0 - 5.6 % Final   HbA1c, POC (controlled diabetic range)  Date Value Ref Range Status  05/09/2018 6.5 0.0 - 7.0 % Final         Passed - Valid encounter within last 6 months    Recent Outpatient Visits          1 week ago Hospital discharge follow-up   [place] Kerin Perna, NP   1 month ago Colon cancer screening  [facility] Kerin Perna, NP   2 months ago Depression with anxiety   [place] Juluis Mire P, NP   5 months ago Type 2 diabetes mellitus without complication, without long-term current use of insulin ([place])   [place] [facility] Kerin Perna, NP   1 year ago Depression, unspecified depression type   [place], [place], NP      Future Appointments            In 2 weeks Oletta Lamas [place] Cage, NP [place]

## 2020-08-05 DIAGNOSIS — M47812 Spondylosis without myelopathy or radiculopathy, cervical region: Secondary | ICD-10-CM | POA: Diagnosis not present

## 2020-08-05 DIAGNOSIS — M9904 Segmental and somatic dysfunction of sacral region: Secondary | ICD-10-CM | POA: Diagnosis not present

## 2020-08-05 DIAGNOSIS — M9902 Segmental and somatic dysfunction of thoracic region: Secondary | ICD-10-CM | POA: Diagnosis not present

## 2020-08-05 DIAGNOSIS — M4727 Other spondylosis with radiculopathy, lumbosacral region: Secondary | ICD-10-CM | POA: Diagnosis not present

## 2020-08-05 DIAGNOSIS — M9905 Segmental and somatic dysfunction of pelvic region: Secondary | ICD-10-CM | POA: Diagnosis not present

## 2020-08-05 DIAGNOSIS — M9901 Segmental and somatic dysfunction of cervical region: Secondary | ICD-10-CM | POA: Diagnosis not present

## 2020-08-05 DIAGNOSIS — R293 Abnormal posture: Secondary | ICD-10-CM | POA: Diagnosis not present

## 2020-08-05 DIAGNOSIS — M9903 Segmental and somatic dysfunction of lumbar region: Secondary | ICD-10-CM | POA: Diagnosis not present

## 2020-08-07 ENCOUNTER — Other Ambulatory Visit (INDEPENDENT_AMBULATORY_CARE_PROVIDER_SITE_OTHER): Payer: Self-pay | Admitting: Primary Care

## 2020-08-07 DIAGNOSIS — M9903 Segmental and somatic dysfunction of lumbar region: Secondary | ICD-10-CM | POA: Diagnosis not present

## 2020-08-07 DIAGNOSIS — M9904 Segmental and somatic dysfunction of sacral region: Secondary | ICD-10-CM | POA: Diagnosis not present

## 2020-08-07 DIAGNOSIS — M4727 Other spondylosis with radiculopathy, lumbosacral region: Secondary | ICD-10-CM | POA: Diagnosis not present

## 2020-08-07 DIAGNOSIS — M9902 Segmental and somatic dysfunction of thoracic region: Secondary | ICD-10-CM | POA: Diagnosis not present

## 2020-08-07 DIAGNOSIS — M9905 Segmental and somatic dysfunction of pelvic region: Secondary | ICD-10-CM | POA: Diagnosis not present

## 2020-08-07 DIAGNOSIS — M9901 Segmental and somatic dysfunction of cervical region: Secondary | ICD-10-CM | POA: Diagnosis not present

## 2020-08-07 DIAGNOSIS — R293 Abnormal posture: Secondary | ICD-10-CM | POA: Diagnosis not present

## 2020-08-07 DIAGNOSIS — M47812 Spondylosis without myelopathy or radiculopathy, cervical region: Secondary | ICD-10-CM | POA: Diagnosis not present

## 2020-08-07 DIAGNOSIS — Z760 Encounter for issue of repeat prescription: Secondary | ICD-10-CM

## 2020-08-08 ENCOUNTER — Ambulatory Visit: Payer: No Typology Code available for payment source

## 2020-08-08 ENCOUNTER — Other Ambulatory Visit: Payer: Self-pay

## 2020-08-08 ENCOUNTER — Ambulatory Visit
Admission: RE | Admit: 2020-08-08 | Discharge: 2020-08-08 | Disposition: A | Discharge status: Home / Self Care | Payer: No Typology Code available for payment source | Source: Ambulatory Visit | Attending: Primary Care | Admitting: Primary Care

## 2020-08-08 DIAGNOSIS — Z1231 Encounter for screening mammogram for malignant neoplasm of breast: Secondary | ICD-10-CM | POA: Diagnosis not present

## 2020-08-08 DIAGNOSIS — M4727 Other spondylosis with radiculopathy, lumbosacral region: Secondary | ICD-10-CM | POA: Diagnosis not present

## 2020-08-08 DIAGNOSIS — M47812 Spondylosis without myelopathy or radiculopathy, cervical region: Secondary | ICD-10-CM | POA: Diagnosis not present

## 2020-08-08 DIAGNOSIS — M9904 Segmental and somatic dysfunction of sacral region: Secondary | ICD-10-CM | POA: Diagnosis not present

## 2020-08-08 DIAGNOSIS — M9903 Segmental and somatic dysfunction of lumbar region: Secondary | ICD-10-CM | POA: Diagnosis not present

## 2020-08-08 DIAGNOSIS — M9902 Segmental and somatic dysfunction of thoracic region: Secondary | ICD-10-CM | POA: Diagnosis not present

## 2020-08-08 DIAGNOSIS — M9901 Segmental and somatic dysfunction of cervical region: Secondary | ICD-10-CM | POA: Diagnosis not present

## 2020-08-08 DIAGNOSIS — R293 Abnormal posture: Secondary | ICD-10-CM | POA: Diagnosis not present

## 2020-08-08 DIAGNOSIS — M9905 Segmental and somatic dysfunction of pelvic region: Secondary | ICD-10-CM | POA: Diagnosis not present

## 2020-08-09 ENCOUNTER — Ambulatory Visit: Payer: No Typology Code available for payment source | Admitting: Podiatry

## 2020-08-09 ENCOUNTER — Telehealth: Payer: Self-pay | Admitting: Primary Care

## 2020-08-09 NOTE — Telephone Encounter (Signed)
Patient aware that FMLA forms were recieved

## 2020-08-09 NOTE — Telephone Encounter (Signed)
Copied from [place] [phone]. Topic: General - Other >> Aug 08, 2020  2:42 PM Keene Breath wrote: Reason for CRM: Patient called to confirm that the office received her fax for her FMLA paperwork.  Please call patient to confirm that they were received.  CB# [phone] >> Aug 09, 2020 10:30 AM Yvette Rack wrote: Pt called verify that the [facility] paperwork was received. Pt requests call back to advise if fax was received as the paperwork is due to be returned by May 1.

## 2020-08-10 ENCOUNTER — Other Ambulatory Visit (INDEPENDENT_AMBULATORY_CARE_PROVIDER_SITE_OTHER): Payer: Self-pay | Admitting: Primary Care

## 2020-08-10 DIAGNOSIS — F32A Depression, unspecified: Secondary | ICD-10-CM

## 2020-08-10 DIAGNOSIS — G4700 Insomnia, unspecified: Secondary | ICD-10-CM

## 2020-08-11 MED ORDER — AMLODIPINE BESYLATE 10 MG PO TABS: 10.0000 mg | ORAL_TABLET | Freq: Every day | ORAL | 3 refills | Status: DC

## 2020-08-12 DIAGNOSIS — M47812 Spondylosis without myelopathy or radiculopathy, cervical region: Secondary | ICD-10-CM | POA: Diagnosis not present

## 2020-08-12 DIAGNOSIS — M9903 Segmental and somatic dysfunction of lumbar region: Secondary | ICD-10-CM | POA: Diagnosis not present

## 2020-08-12 DIAGNOSIS — M9901 Segmental and somatic dysfunction of cervical region: Secondary | ICD-10-CM | POA: Diagnosis not present

## 2020-08-12 DIAGNOSIS — M4727 Other spondylosis with radiculopathy, lumbosacral region: Secondary | ICD-10-CM | POA: Diagnosis not present

## 2020-08-12 DIAGNOSIS — R293 Abnormal posture: Secondary | ICD-10-CM | POA: Diagnosis not present

## 2020-08-12 DIAGNOSIS — M9902 Segmental and somatic dysfunction of thoracic region: Secondary | ICD-10-CM | POA: Diagnosis not present

## 2020-08-12 DIAGNOSIS — M9905 Segmental and somatic dysfunction of pelvic region: Secondary | ICD-10-CM | POA: Diagnosis not present

## 2020-08-12 DIAGNOSIS — M9904 Segmental and somatic dysfunction of sacral region: Secondary | ICD-10-CM | POA: Diagnosis not present

## 2020-08-14 DIAGNOSIS — M9901 Segmental and somatic dysfunction of cervical region: Secondary | ICD-10-CM | POA: Diagnosis not present

## 2020-08-14 DIAGNOSIS — R293 Abnormal posture: Secondary | ICD-10-CM | POA: Diagnosis not present

## 2020-08-14 DIAGNOSIS — M9902 Segmental and somatic dysfunction of thoracic region: Secondary | ICD-10-CM | POA: Diagnosis not present

## 2020-08-14 DIAGNOSIS — M47812 Spondylosis without myelopathy or radiculopathy, cervical region: Secondary | ICD-10-CM | POA: Diagnosis not present

## 2020-08-14 DIAGNOSIS — M9903 Segmental and somatic dysfunction of lumbar region: Secondary | ICD-10-CM | POA: Diagnosis not present

## 2020-08-14 DIAGNOSIS — M9905 Segmental and somatic dysfunction of pelvic region: Secondary | ICD-10-CM | POA: Diagnosis not present

## 2020-08-14 DIAGNOSIS — M4727 Other spondylosis with radiculopathy, lumbosacral region: Secondary | ICD-10-CM | POA: Diagnosis not present

## 2020-08-14 DIAGNOSIS — M9904 Segmental and somatic dysfunction of sacral region: Secondary | ICD-10-CM | POA: Diagnosis not present

## 2020-08-15 ENCOUNTER — Telehealth: Payer: Self-pay

## 2020-08-15 DIAGNOSIS — M47812 Spondylosis without myelopathy or radiculopathy, cervical region: Secondary | ICD-10-CM | POA: Diagnosis not present

## 2020-08-15 DIAGNOSIS — M9903 Segmental and somatic dysfunction of lumbar region: Secondary | ICD-10-CM | POA: Diagnosis not present

## 2020-08-15 DIAGNOSIS — M9905 Segmental and somatic dysfunction of pelvic region: Secondary | ICD-10-CM | POA: Diagnosis not present

## 2020-08-15 DIAGNOSIS — M9901 Segmental and somatic dysfunction of cervical region: Secondary | ICD-10-CM | POA: Diagnosis not present

## 2020-08-15 DIAGNOSIS — M9902 Segmental and somatic dysfunction of thoracic region: Secondary | ICD-10-CM | POA: Diagnosis not present

## 2020-08-15 DIAGNOSIS — R293 Abnormal posture: Secondary | ICD-10-CM | POA: Diagnosis not present

## 2020-08-15 DIAGNOSIS — M4727 Other spondylosis with radiculopathy, lumbosacral region: Secondary | ICD-10-CM | POA: Diagnosis not present

## 2020-08-15 DIAGNOSIS — M9904 Segmental and somatic dysfunction of sacral region: Secondary | ICD-10-CM | POA: Diagnosis not present

## 2020-08-15 NOTE — Telephone Encounter (Signed)
Copied from [place] [phone]. Topic: General - Other >> Aug 15, 2020  3:10 PM Tessa Lerner A wrote: Reason for CRM: Patient has made contact regarding their FMLA paperwork submitted to the practice on 08/08/20  Patient has stressed the urgency of the paperwork's completion and it's necessity to be completed by 08/16/20  The fax number of where the paperwork needs to be submitted should be clearly visible  Patient would like to be contacted regarding the paperwork's completion

## 2020-08-16 ENCOUNTER — Encounter (INDEPENDENT_AMBULATORY_CARE_PROVIDER_SITE_OTHER): Payer: Self-pay | Admitting: Primary Care

## 2020-08-16 ENCOUNTER — Ambulatory Visit: Payer: No Typology Code available for payment source | Admitting: Podiatry

## 2020-08-16 NOTE — Telephone Encounter (Signed)
Patient called in to ask about her FMLA paper work stated that she spoke to [employer] personally and explained there was a deadline or she will lose her job. Please advise  Ph# [phone]

## 2020-08-19 ENCOUNTER — Ambulatory Visit (AMBULATORY_SURGERY_CENTER): Payer: Self-pay

## 2020-08-19 ENCOUNTER — Other Ambulatory Visit: Payer: Self-pay

## 2020-08-19 VITALS — Ht 67.0 in | Wt 317.0 lb

## 2020-08-19 DIAGNOSIS — M9903 Segmental and somatic dysfunction of lumbar region: Secondary | ICD-10-CM | POA: Diagnosis not present

## 2020-08-19 DIAGNOSIS — Z1211 Encounter for screening for malignant neoplasm of colon: Secondary | ICD-10-CM

## 2020-08-19 DIAGNOSIS — R293 Abnormal posture: Secondary | ICD-10-CM | POA: Diagnosis not present

## 2020-08-19 DIAGNOSIS — M9901 Segmental and somatic dysfunction of cervical region: Secondary | ICD-10-CM | POA: Diagnosis not present

## 2020-08-19 DIAGNOSIS — M9902 Segmental and somatic dysfunction of thoracic region: Secondary | ICD-10-CM | POA: Diagnosis not present

## 2020-08-19 DIAGNOSIS — M9904 Segmental and somatic dysfunction of sacral region: Secondary | ICD-10-CM | POA: Diagnosis not present

## 2020-08-19 DIAGNOSIS — M47812 Spondylosis without myelopathy or radiculopathy, cervical region: Secondary | ICD-10-CM | POA: Diagnosis not present

## 2020-08-19 DIAGNOSIS — M9905 Segmental and somatic dysfunction of pelvic region: Secondary | ICD-10-CM | POA: Diagnosis not present

## 2020-08-19 DIAGNOSIS — M4727 Other spondylosis with radiculopathy, lumbosacral region: Secondary | ICD-10-CM | POA: Diagnosis not present

## 2020-08-19 MED ORDER — SUTAB 1479-225-188 MG PO TABS: 1.0000 | ORAL_TABLET | ORAL | 0 refills | Status: DC

## 2020-08-19 NOTE — Progress Notes (Signed)
No egg or soy allergy known to patient  No issues with past sedation with any surgeries or procedures Patient denies ever being told they had issues or difficulty with intubation  No FH of Malignant Hyperthermia No diet pills per patient No home 02 use per patient  No blood thinners per patient  Pt reports issues with constipation -will use Miralax 5 days prior to prep No A fib or A flutter  EMMI video via [place] 19 guidelines implemented in PV today with Pt and RN  Pt is fully vaccinated  for Covid  Coupon given to pt in PV today, Code to Pharmacy and NO PA's for preps discussed with pt in PV today  Discussed with pt there will be an out-of-pocket cost for prep and that varies from $0 to 70 dollars  Due to the COVID-19 pandemic we are asking patients to follow certain guidelines.  Pt aware of COVID protocols and [facility] guidelines

## 2020-08-19 NOTE — Telephone Encounter (Signed)
Pt is calling checking on  FMLA paperwork

## 2020-08-20 NOTE — Telephone Encounter (Signed)
Please contact patient. FMLA forms were dropped off about one week ago. Patient needed these to be completed by May 1.

## 2020-08-20 NOTE — Telephone Encounter (Signed)
Done

## 2020-08-21 ENCOUNTER — Telehealth (INDEPENDENT_AMBULATORY_CARE_PROVIDER_SITE_OTHER): Payer: No Typology Code available for payment source | Admitting: Primary Care

## 2020-08-22 DIAGNOSIS — R293 Abnormal posture: Secondary | ICD-10-CM | POA: Diagnosis not present

## 2020-08-22 DIAGNOSIS — M9901 Segmental and somatic dysfunction of cervical region: Secondary | ICD-10-CM | POA: Diagnosis not present

## 2020-08-22 DIAGNOSIS — M9904 Segmental and somatic dysfunction of sacral region: Secondary | ICD-10-CM | POA: Diagnosis not present

## 2020-08-22 DIAGNOSIS — M4727 Other spondylosis with radiculopathy, lumbosacral region: Secondary | ICD-10-CM | POA: Diagnosis not present

## 2020-08-22 DIAGNOSIS — M9903 Segmental and somatic dysfunction of lumbar region: Secondary | ICD-10-CM | POA: Diagnosis not present

## 2020-08-22 DIAGNOSIS — M47812 Spondylosis without myelopathy or radiculopathy, cervical region: Secondary | ICD-10-CM | POA: Diagnosis not present

## 2020-08-22 DIAGNOSIS — M9905 Segmental and somatic dysfunction of pelvic region: Secondary | ICD-10-CM | POA: Diagnosis not present

## 2020-08-22 DIAGNOSIS — M9902 Segmental and somatic dysfunction of thoracic region: Secondary | ICD-10-CM | POA: Diagnosis not present

## 2020-08-23 ENCOUNTER — Other Ambulatory Visit (INDEPENDENT_AMBULATORY_CARE_PROVIDER_SITE_OTHER): Payer: Self-pay | Admitting: Primary Care

## 2020-08-23 DIAGNOSIS — Z760 Encounter for issue of repeat prescription: Secondary | ICD-10-CM

## 2020-08-27 ENCOUNTER — Encounter: Payer: No Typology Code available for payment source | Admitting: Gastroenterology

## 2020-08-28 DIAGNOSIS — R293 Abnormal posture: Secondary | ICD-10-CM | POA: Diagnosis not present

## 2020-08-28 DIAGNOSIS — M9903 Segmental and somatic dysfunction of lumbar region: Secondary | ICD-10-CM | POA: Diagnosis not present

## 2020-08-28 DIAGNOSIS — M9902 Segmental and somatic dysfunction of thoracic region: Secondary | ICD-10-CM | POA: Diagnosis not present

## 2020-08-28 DIAGNOSIS — M4727 Other spondylosis with radiculopathy, lumbosacral region: Secondary | ICD-10-CM | POA: Diagnosis not present

## 2020-08-28 DIAGNOSIS — M47812 Spondylosis without myelopathy or radiculopathy, cervical region: Secondary | ICD-10-CM | POA: Diagnosis not present

## 2020-08-28 DIAGNOSIS — M9901 Segmental and somatic dysfunction of cervical region: Secondary | ICD-10-CM | POA: Diagnosis not present

## 2020-08-28 DIAGNOSIS — M9904 Segmental and somatic dysfunction of sacral region: Secondary | ICD-10-CM | POA: Diagnosis not present

## 2020-08-28 DIAGNOSIS — M9905 Segmental and somatic dysfunction of pelvic region: Secondary | ICD-10-CM | POA: Diagnosis not present

## 2020-08-29 ENCOUNTER — Encounter: Payer: No Typology Code available for payment source | Admitting: Gastroenterology

## 2020-08-29 ENCOUNTER — Telehealth: Payer: Self-pay | Admitting: Gastroenterology

## 2020-08-29 NOTE — Telephone Encounter (Signed)
Hey Dr. Lyndel Safe,   Inbound call from patient. Same day cancel colon procedure. States she had an anxiety episode about the having the procedure. She did not do all the prep as well because of her anxiety. Patient rescheduled for 10/18/20.

## 2020-08-29 NOTE — Telephone Encounter (Signed)
Thanks for letting me know RG 

## 2020-09-10 ENCOUNTER — Other Ambulatory Visit (INDEPENDENT_AMBULATORY_CARE_PROVIDER_SITE_OTHER): Payer: Self-pay | Admitting: Primary Care

## 2020-09-10 NOTE — Telephone Encounter (Signed)
Requested medications are due for refill today.  unknown  Requested medications are on the active medications list.  yes  Last refill. unknown  Future visit scheduled.   no  Notes to clinic.  Historical medication

## 2020-10-14 ENCOUNTER — Encounter: Payer: Self-pay | Admitting: Gastroenterology

## 2020-10-14 ENCOUNTER — Telehealth: Payer: Self-pay | Admitting: Gastroenterology

## 2020-10-14 NOTE — Telephone Encounter (Signed)
Called patient to remind her of her appointment on October 18, 2020.  She stated she did not start miralax,  She stated she wanted to wait another month or so, she rescheduled to 11-26-2020.

## 2020-10-14 NOTE — Telephone Encounter (Signed)
Thanks for letting me know RG 

## 2020-10-18 ENCOUNTER — Encounter: Payer: No Typology Code available for payment source | Admitting: Gastroenterology

## 2020-10-29 ENCOUNTER — Other Ambulatory Visit (INDEPENDENT_AMBULATORY_CARE_PROVIDER_SITE_OTHER): Payer: Self-pay | Admitting: Primary Care

## 2020-10-29 DIAGNOSIS — Z760 Encounter for issue of repeat prescription: Secondary | ICD-10-CM

## 2020-11-12 ENCOUNTER — Ambulatory Visit (AMBULATORY_SURGERY_CENTER): Payer: Self-pay

## 2020-11-12 ENCOUNTER — Other Ambulatory Visit: Payer: Self-pay

## 2020-11-12 ENCOUNTER — Telehealth: Payer: Self-pay

## 2020-11-12 VITALS — Ht 66.0 in | Wt 316.0 lb

## 2020-11-12 DIAGNOSIS — Z1211 Encounter for screening for malignant neoplasm of colon: Secondary | ICD-10-CM

## 2020-11-12 NOTE — Progress Notes (Signed)
No egg or soy allergy known to patient  No issues with past sedation with any surgeries or procedures Patient denies ever being told they had issues or difficulty with intubation  No FH of Malignant Hyperthermia No diet pills per patient No home 02 use per patient  No blood thinners per patient  Pt denies issues with constipation  No A fib or A flutter  EMMI video to pt or via [place] 19 guidelines implemented in PV today with Pt and RN   Pt came in for her previsit for colon scheduled on 11/26/20 at the [facility], pt's BMI is now 23.  I let the pt know our parameters for the [facility] and that colon will need to be r/s at [facility].  She is understanding of this, I let her know Dr Lyndel Safe may want an OV first and that his nurse would be in contact with her.    Pt states she has the sutab prep at home, instructions given for sutab at the hospital with blanks left in for pt to fill out when procedure is r/s.  Due to the COVID-19 pandemic we are asking patients to follow certain guidelines.  Pt aware of COVID protocols and [facility] guidelines

## 2020-11-12 NOTE — Telephone Encounter (Signed)
Dr Lyndel Safe,  Pt came in for her previsit for colon scheduled on 11/26/20 at the [facility], pt's BMI is now 33.  I let the pt know our parameters for the [facility] and that colon will need to be r/s at [facility].  She is hoping to schedule for that same day but I told her that may not be possible.(I let her know your nurse will call to schedule)   Also, would you like an ov first or just the direct to [facility]?

## 2020-11-14 NOTE — Telephone Encounter (Signed)
Message received. I will reroute message to Dr Lyndel Safe as well as send him a text.

## 2020-11-18 ENCOUNTER — Other Ambulatory Visit: Payer: Self-pay

## 2020-11-18 ENCOUNTER — Other Ambulatory Visit: Payer: Self-pay | Admitting: *Deleted

## 2020-11-18 ENCOUNTER — Encounter: Payer: Self-pay | Admitting: *Deleted

## 2020-11-18 DIAGNOSIS — Z1211 Encounter for screening for malignant neoplasm of colon: Secondary | ICD-10-CM

## 2020-11-18 NOTE — Telephone Encounter (Signed)
Spoke patient, regarding colonoscopy. Patient has been scheduled for 01/23/21 at 10:00am Patient has has pre screening. Patient states she has blank instructions but is knowledgeable of procedure. I have placed instructions in mail with updated instructions. Patient verbalized understanding, told patient to contact office if any other questions/concerns arise.  Spoke with Maudie Mercury- Case [phone]

## 2020-11-18 NOTE — Telephone Encounter (Signed)
Lets set it up at [place]

## 2020-11-19 NOTE — Telephone Encounter (Signed)
Thank you  Lauren

## 2020-11-26 ENCOUNTER — Encounter: Payer: No Typology Code available for payment source | Admitting: Gastroenterology

## 2020-12-10 ENCOUNTER — Other Ambulatory Visit (INDEPENDENT_AMBULATORY_CARE_PROVIDER_SITE_OTHER): Payer: Self-pay | Admitting: Primary Care

## 2020-12-10 ENCOUNTER — Other Ambulatory Visit (INDEPENDENT_AMBULATORY_CARE_PROVIDER_SITE_OTHER): Payer: Self-pay | Admitting: Family Medicine

## 2020-12-10 DIAGNOSIS — Z760 Encounter for issue of repeat prescription: Secondary | ICD-10-CM

## 2021-01-13 ENCOUNTER — Encounter (HOSPITAL_COMMUNITY): Payer: Self-pay | Admitting: Gastroenterology

## 2021-01-14 ENCOUNTER — Other Ambulatory Visit: Payer: Self-pay

## 2021-01-14 MED ORDER — SUTAB 1479-225-188 MG PO TABS: 1.0000 | ORAL_TABLET | ORAL | 0 refills | Status: DC

## 2021-01-14 NOTE — Progress Notes (Signed)
Spoke to patient and Nila Nephew was electrically sent in to verified pharmacy. Patient is aware as well

## 2021-01-16 ENCOUNTER — Telehealth: Payer: Self-pay | Admitting: Gastroenterology

## 2021-01-16 ENCOUNTER — Encounter (INDEPENDENT_AMBULATORY_CARE_PROVIDER_SITE_OTHER): Payer: Self-pay | Admitting: Primary Care

## 2021-01-16 ENCOUNTER — Ambulatory Visit (INDEPENDENT_AMBULATORY_CARE_PROVIDER_SITE_OTHER): Payer: No Typology Code available for payment source | Admitting: Primary Care

## 2021-01-16 ENCOUNTER — Other Ambulatory Visit: Payer: Self-pay

## 2021-01-16 VITALS — BP 136/85 | HR 74 | Temp 97.3°F | Ht 67.0 in | Wt 311.8 lb

## 2021-01-16 DIAGNOSIS — F411 Generalized anxiety disorder: Secondary | ICD-10-CM | POA: Diagnosis not present

## 2021-01-16 DIAGNOSIS — E119 Type 2 diabetes mellitus without complications: Secondary | ICD-10-CM | POA: Diagnosis not present

## 2021-01-16 DIAGNOSIS — F418 Other specified anxiety disorders: Secondary | ICD-10-CM | POA: Diagnosis not present

## 2021-01-16 DIAGNOSIS — R69 Illness, unspecified: Secondary | ICD-10-CM | POA: Diagnosis not present

## 2021-01-16 DIAGNOSIS — Z6841 Body Mass Index (BMI) 40.0 and over, adult: Secondary | ICD-10-CM

## 2021-01-16 DIAGNOSIS — Z760 Encounter for issue of repeat prescription: Secondary | ICD-10-CM | POA: Diagnosis not present

## 2021-01-16 LAB — POCT GLYCOSYLATED HEMOGLOBIN (HGB A1C): Hemoglobin A1C: 6.7 % — AB (ref 4.0–5.6)

## 2021-01-16 MED ORDER — SAXENDA 18 MG/3ML ~~LOC~~ SOPN: 3.0000 mg | PEN_INJECTOR | Freq: Every day | SUBCUTANEOUS | Status: DC

## 2021-01-16 MED ORDER — BUSPIRONE HCL 7.5 MG PO TABS: 7.5000 mg | ORAL_TABLET | Freq: Three times a day (TID) | ORAL | 1 refills | Status: DC

## 2021-01-16 MED ORDER — ESCITALOPRAM OXALATE 20 MG PO TABS: 20.0000 mg | ORAL_TABLET | Freq: Every day | ORAL | 1 refills | Status: DC

## 2021-01-16 NOTE — Telephone Encounter (Signed)
Inbound call from pt stating that she just wanted to report that she thought she was 328lbs but she is now 311lbs. Please advise. Thank you

## 2021-01-16 NOTE — Telephone Encounter (Signed)
Pt  stated that she just wanted [place] to update our records and notified us that she is currently weighing 311 lbs. Pt was congratulated on her weight loss.

## 2021-01-16 NOTE — Progress Notes (Signed)
[facility]   Subjective:  Patient ID: Catherine [name], female    DOB: February 24, 1971  Age: 50 y.o. MRN: [id number]  CC: Diabetes and Medication Refill   HPI Catherine [name] presents forFollow-up of diabetes. Patient does not check blood sugar at home. She is interested in taking Ozempic to help with weight loss possibility it will not be covered.  Compliant with meds - Yes Checking CBGs? No  Fasting avg -   Postprandial average -  Exercising regularly? - Yes Watching carbohydrate intake? - Yes Neuropathy ? - No Hypoglycemic events - No  - Recovers with :   Pertinent ROS:  Polyuria - No Polydipsia - No Vision problems - No  Medications as noted below. Taking them regularly without complication/adverse reaction being reported today.   History Catherine [name] has a past medical history of Allergy, Anemia, Anxiety, Blood transfusion without reported diagnosis, Bronchitis, Depression, Diabetes mellitus without complication ([place]), Hyperlipidemia, Hypertension, and Sleep apnea.   She has a past surgical history that includes Myomectomy (2005); Bunionectomy (2012); Cesarean section; LAVH (2009); Wisdom tooth extraction; and Laparoscopic vaginal hysterectomy (2007).   Her family history includes Asthma in her daughter and mother; Colon polyps in her mother; Heart attack in her father; Hypertension in her father, mother, and sister.She reports that she has been smoking cigarettes. She has been smoking an average of .5 packs per day. She has never used smokeless tobacco. She reports current alcohol use. She reports that she does not use drugs.  Current Outpatient Medications on File Prior to Visit  Medication Sig Dispense Refill   albuterol (VENTOLIN HFA) 108 (90 Base) MCG/ACT inhaler INHALE 1-2 PUFFS BY MOUTH EVERY 6 HOURS AS NEEDED FOR WHEEZE OR SHORTNESS OF BREATH 18 each 0   amLODipine (NORVASC) 10 MG tablet Take 1 tablet (10 mg total) by mouth daily. 90 tablet 3   fluticasone (FLONASE)  50 MCG/ACT nasal spray Place 1 spray into both nostrils daily as needed for allergies.     hydrochlorothiazide (HYDRODIURIL) 25 MG tablet TAKE 1 TABLET EVERY MORNING 90 tablet 0   losartan (COZAAR) 50 MG tablet TAKE 2 TABLETS BY MOUTH EVERY DAY 180 tablet 0   Sodium Sulfate-Mag Sulfate-KCl (SUTAB) [phone] MG TABS Take 1 kit by mouth as directed. MANUFACTURER CODES!! BIN: [id number] PCN: CN GROUP: [id number] MEMBER ID: [id number];QXI AS SECONDARY INSURANCE ;NO PRIOR AUTHORIZATION 24 tablet 0   spironolactone (ALDACTONE) 25 MG tablet Take 1 tablet (25 mg total) by mouth daily. (Patient not taking: No sig reported) 90 tablet 3   acetaminophen (TYLENOL) 500 MG tablet Take 1,000 mg by mouth every 6 (six) hours as needed for moderate pain or headache.     No current facility-administered medications on file prior to visit.    ROS Review of Systems  All other systems reviewed and are negative.  Objective:  BP 136/85 (BP Location: Right Arm, Patient Position: Sitting, Cuff Size: Large)   Pulse 74   Temp (!) 97.3 F (36.3 C) (Temporal)   Ht $R'5\' 7"'qc$  (1.702 m)   Wt (!) 311 lb 12.8 oz (141.4 kg)   SpO2 95%   BMI 48.83 kg/m   BP Readings from Last 3 Encounters:  01/16/21 136/85  07/22/20 135/88  07/20/20 132/75    Wt Readings from Last 3 Encounters:  01/16/21 (!) 311 lb 12.8 oz (141.4 kg)  11/12/20 (!) 316 lb (143.3 kg)  08/19/20 (!) 317 lb (143.8 kg)    Physical Exam General: No apparent distress.serve morbid obesity  Eyes: Extraocular eye movements intact, pupils equal and round. Neck: Supple, trachea midline.Thick  Thyroid: No enlargement, mobile without fixation, no tenderness. Cardiovascular: Regular rhythm and rate, no murmur, normal radial pulses. Respiratory: Normal respiratory effort, clear to auscultation. Gastrointestinal: Normal pitch active bowel sounds, nontender abdomen without distention or appreciable hepatomegaly. Neurologic:  no tremor Musculoskeletal: Normal muscle  tone, no tenderness on palpation of tibia, no excessive thoracic kyphosis. Skin: Appropriate warmth, no visible rash. Mental status: Alert, conversant, speech clear, thought logical, appropriate mood and affect, no hallucinations or delusions evident. Hematologic/lymphatic: No cervical adenopathy, no visible ecchymoses.  Lab Results  Component Value Date   HGBA1C 6.7 (A) 01/16/2021   HGBA1C 6.8 (A) 02/21/2020   HGBA1C 6.7 (A) 11/14/2018    Lab Results  Component Value Date   WBC 8.0 01/16/2021   HGB 14.3 01/16/2021   HCT 43.8 01/16/2021   PLT 274 01/16/2021   GLUCOSE 124 (H) 01/16/2021   CHOL 174 01/16/2021   TRIG 71 01/16/2021   HDL 46 01/16/2021   LDLCALC 115 (H) 01/16/2021   ALT 16 01/16/2021   AST 18 01/16/2021   NA 141 01/16/2021   K 4.4 01/16/2021   CL 101 01/16/2021   CREATININE 0.89 01/16/2021   BUN 13 01/16/2021   CO2 26 01/16/2021   TSH 1.440 01/16/2021   HGBA1C 6.7 (A) 01/16/2021     Assessment & Plan:  Catherine [name] was seen today for diabetes and medication refill.  Diagnoses and all orders for this visit:  Type 2 diabetes mellitus without complication, without long-term current use of insulin (HCC) D/C Amaryl $RemoveBe'2mg'xyPteRhVL$  change to Saxenda explained directions and will follow up with clinical pharmacist. Continue to foods that are high in carbohydrates are the following rice, potatoes, breads, sugars, and pastas.  Reduction in the intake (eating) will assist in lowering your blood sugars.  -     HgB A1c 6.7  -     CBC with Differential -     CMP14+EGFR -     Lipid Panel  Morbid obesity (HCC) Morbid Obesity is > 40 indicating an excess in caloric intake or underlining conditions. This may lead to other co-morbidities. Lifestyle modifications of diet and exercise may reduce obesity.   Added Saxenda to help management weight  -     TSH + free T4 -     Lipid Panel  Medication refill -     busPIRone (BUSPAR) 7.5 MG tablet; Take 1 tablet (7.5 mg total) by mouth 3 (three)  times daily. -     escitalopram (LEXAPRO) 20 MG tablet; Take 1 tablet (20 mg total) by mouth daily.  Generalized anxiety disorder Manager  -     busPIRone (BUSPAR) 7.5 MG tablet; Take 1 tablet (7.5 mg total) by mouth 3 (three) times daily. -     escitalopram (LEXAPRO) 20 MG tablet; Take 1 tablet (20 mg total) by mouth daily.  Depression with anxiety -     escitalopram (LEXAPRO) 20 MG tablet; Take 1 tablet (20 mg total) by mouth daily.  Other order. -     Liraglutide -Weight Management (SAXENDA) 18 MG/3ML SOPN; Inject 3 mg into the skin daily. 0.6 mg daily for 1 week, 1.2 mg daily for the next week, 1.8 mg daily for next week. If tolerated, can further increase to 2.4 mg daily.   I have discontinued Catherine [name]'s nicotine, nicotine, nicotine, and glimepiride. I am also having her maintain her fluticasone, spironolactone, amLODipine, albuterol, hydrochlorothiazide, losartan, Sutab, busPIRone, escitalopram,  and Saxenda.  Meds ordered this encounter  Medications   busPIRone (BUSPAR) 7.5 MG tablet    Sig: Take 1 tablet (7.5 mg total) by mouth 3 (three) times daily.    Dispense:  270 tablet    Refill:  1   escitalopram (LEXAPRO) 20 MG tablet    Sig: Take 1 tablet (20 mg total) by mouth daily.    Dispense:  90 tablet    Refill:  1   DISCONTD: Liraglutide -Weight Management (SAXENDA) 18 MG/3ML SOPN    Sig: Inject 3 mg into the skin daily. 0.6 mg daily for 1 week, 1.2 mg daily for the next week, 1.8 mg daily for next week. If tolerated, can further increase to 2.4 mg daily.    Dispense:  2 mL    Refill:  BOX   Liraglutide -Weight Management (SAXENDA) 18 MG/3ML SOPN    Sig: Inject 3 mg into the skin daily. 0.6 mg daily for 1 week, 1.2 mg daily for the next week, 1.8 mg daily for next week. If tolerated, can further increase to 2.4 mg daily.    Dispense:  2 mL    Refill:  BOX     Follow-up:   Return in about 4 weeks (around 02/13/2021) for [facility] medication management 02/13/21 at  9am.  The above assessment and management plan was discussed with the patient. The patient verbalized understanding of and has agreed to the management plan. Patient is aware to call the clinic if symptoms fail to improve or worsen. Patient is aware when to return to the clinic for a follow-up visit. Patient educated on when it is appropriate to go to the emergency department.   Juluis Mire, NP-C

## 2021-01-16 NOTE — Progress Notes (Signed)
Would like to know if she can be put on ozempic

## 2021-01-16 NOTE — Patient Instructions (Signed)
Liraglutide Injection (Weight Management) What is this medication? LIRAGLUTIDE (LIR a GLOO tide) promotes weight loss. It may also be used to maintain weight loss. It works by decreasing appetite. Changes to diet and exercise are often combined with this medication. This medicine may be used for other purposes; ask your health care provider or pharmacist if you have questions. COMMON BRAND NAME(S): Saxenda What should I tell my care team before I take this medication? They need to know if you have any of these conditions: Endocrine tumors (MEN 2) or if someone in your family had these tumors Gallbladder disease High cholesterol History of alcohol abuse problem History of pancreatitis Kidney disease or if you are on dialysis Liver disease Previous swelling of the tongue, face, or lips with difficulty breathing, difficulty swallowing, hoarseness, or tightening of the throat Stomach problems Suicidal thoughts, plans, or attempt; a previous suicide attempt by you or a family member Thyroid cancer or if someone in your family had thyroid cancer An unusual or allergic reaction to liraglutide, other medications, foods, dyes, or preservatives Pregnant or trying to get pregnant Breast-feeding How should I use this medication? This medication is for injection under the skin of your upper leg, stomach area, or upper arm. You will be taught how to prepare and give this medication. Use exactly as directed. Take your medication at regular intervals. Do not take it more often than directed. This medication comes with INSTRUCTIONS FOR USE. Ask your pharmacist for directions on how to use this medication. Read the information carefully. Talk to your pharmacist or care team if you have questions. It is important that you put your used needles and syringes in a special sharps container. Do not put them in a trash can. If you do not have a sharps container, call your pharmacist or care team to get one. A  special MedGuide will be given to you by the pharmacist with each prescription and refill. Be sure to read this information carefully each time. Talk to your care team about the use of this medication in children. While it may be prescribed for children as young as 58 years of age for selected conditions, precautions do apply. Overdosage: If you think you have taken too much of this medicine contact a poison control center or emergency room at once. NOTE: This medicine is only for you. Do not share this medicine with others. What if I miss a dose? If you miss a dose, take it as soon as you can. If it is almost time for your next dose, take only that dose. Do not take double or extra doses. If you miss your dose for 3 days or more, call your care team to talk about how to restart this medicine. What may interact with this medication? Insulin and other medications for diabetes This list may not describe all possible interactions. Give your health care provider a list of all the medicines, herbs, non-prescription drugs, or dietary supplements you use. Also tell them if you smoke, drink alcohol, or use illegal drugs. Some items may interact with your medicine. What should I watch for while using this medication? Visit your care team for regular checks on your progress. Drink plenty of fluids while taking this medication. Check with your care team if you get an attack of severe diarrhea, nausea, and vomiting. The loss of too much body fluid can make it dangerous for you to take this medication. This medication may affect blood sugar levels. Ask your care team if  changes in diet or medications are needed if you have diabetes. Patients and their families should watch out for worsening depression or thoughts of suicide. Also watch out for sudden changes in feelings such as feeling anxious, agitated, panicky, irritable, hostile, aggressive, impulsive, severely restless, overly excited and hyperactive, or not  being able to sleep. If this happens, especially at the beginning of treatment or after a change in dose, call your care team. Women should inform their care team if they wish to become pregnant or think they might be pregnant. Losing weight while pregnant is not advised and may cause harm to the unborn child. Talk to your care team for more information. What side effects may I notice from receiving this medication? Side effects that you should report to your care team as soon as possible: Allergic reactions or angioedema-skin rash, itching, hives, swelling of the face, eyes, lips, tongue, arms, or legs, trouble swallowing or breathing Fast or irregular heartbeat Gallbladder problems-severe stomach pain, nausea, vomiting, fever Kidney injury-decrease in the amount of urine, swelling of the ankles, hands, or feet Pancreatitis-severe stomach pain that spreads to your back or gets worse after eating or when touched, fever, nausea, vomiting Thoughts of suicide or self-harm, worsening mood, feelings of depression Thyroid cancer-new mass or lump in the neck, pain or trouble swallowing, trouble breathing, hoarseness Side effects that usually do not require medical attention (report to your care team if they continue or are bothersome): Constipation Dizziness Fatigue Headache Loss of Appetite Nausea Upset stomach This list may not describe all possible side effects. Call your doctor for medical advice about side effects. You may report side effects to FDA at 1-800-FDA-1088. Where should I keep my medication? Keep out of the reach of children and pets. Store unopened pen in a refrigerator between 2 and 8 degrees C (36 and 46 degrees F). Do not freeze or use if the medication has been frozen. Protect from light and excessive heat. After you first use the pen, it can be stored at room temperature between 15 and 30 degrees C (59 and 86 degrees F) or in a refrigerator. Throw away your used pen after 30 days  or after the expiration date, whichever comes first. Do not store your pen with the needle attached. If the needle is left on, medication may leak from the pen. NOTE: This sheet is a summary. It may not cover all possible information. If you have questions about this medicine, talk to your doctor, pharmacist, or health care provider.  2022 Elsevier/Gold Standard (2020-04-26 13:54:21)

## 2021-01-17 ENCOUNTER — Telehealth (INDEPENDENT_AMBULATORY_CARE_PROVIDER_SITE_OTHER): Payer: Self-pay | Admitting: Primary Care

## 2021-01-17 LAB — CBC WITH DIFFERENTIAL/PLATELET
Basophils Absolute: 0.1 10*3/uL (ref 0.0–0.2)
Basos: 1 %
EOS (ABSOLUTE): 0.4 10*3/uL (ref 0.0–0.4)
Eos: 5 %
Hematocrit: 43.8 % (ref 34.0–46.6)
Hemoglobin: 14.3 g/dL (ref 11.1–15.9)
Immature Grans (Abs): 0 10*3/uL (ref 0.0–0.1)
Immature Granulocytes: 0 %
Lymphocytes Absolute: 3.6 10*3/uL — ABNORMAL HIGH (ref 0.7–3.1)
Lymphs: 46 %
MCH: 29.9 pg (ref 26.6–33.0)
MCHC: 32.6 g/dL (ref 31.5–35.7)
MCV: 92 fL (ref 79–97)
Monocytes Absolute: 0.7 10*3/uL (ref 0.1–0.9)
Monocytes: 9 %
Neutrophils Absolute: 3.1 10*3/uL (ref 1.4–7.0)
Neutrophils: 39 %
Platelets: 274 10*3/uL (ref 150–450)
RBC: 4.78 x10E6/uL (ref 3.77–5.28)
RDW: 12.9 % (ref 11.7–15.4)
WBC: 8 10*3/uL (ref 3.4–10.8)

## 2021-01-17 LAB — LIPID PANEL
Chol/HDL Ratio: 3.8 ratio (ref 0.0–4.4)
Cholesterol, Total: 174 mg/dL (ref 100–199)
HDL: 46 mg/dL (ref >39)
LDL Chol Calc (NIH): 115 mg/dL — ABNORMAL HIGH (ref 0–99)
Triglycerides: 71 mg/dL (ref 0–149)
VLDL Cholesterol Cal: 13 mg/dL (ref 5–40)

## 2021-01-17 LAB — CMP14+EGFR
ALT: 16 IU/L (ref 0–32)
AST: 18 IU/L (ref 0–40)
Albumin/Globulin Ratio: 1.7 (ref 1.2–2.2)
Albumin: 4.5 g/dL (ref 3.8–4.8)
Alkaline Phosphatase: 75 IU/L (ref 44–121)
BUN/Creatinine Ratio: 15 (ref 9–23)
BUN: 13 mg/dL (ref 6–24)
Bilirubin Total: 0.4 mg/dL (ref 0.0–1.2)
CO2: 26 mmol/L (ref 20–29)
Calcium: 9.8 mg/dL (ref 8.7–10.2)
Chloride: 101 mmol/L (ref 96–106)
Creatinine, Ser: 0.89 mg/dL (ref 0.57–1.00)
Globulin, Total: 2.7 g/dL (ref 1.5–4.5)
Glucose: 124 mg/dL — ABNORMAL HIGH (ref 70–99)
Potassium: 4.4 mmol/L (ref 3.5–5.2)
Sodium: 141 mmol/L (ref 134–144)
Total Protein: 7.2 g/dL (ref 6.0–8.5)
eGFR: 79 mL/min/{1.73_m2} (ref >59)

## 2021-01-17 LAB — TSH+FREE T4
Free T4: 1.07 ng/dL (ref 0.82–1.77)
TSH: 1.44 u[IU]/mL (ref 0.450–4.500)

## 2021-01-17 NOTE — Telephone Encounter (Signed)
Catherine [name], from [place], calling stating that she has a direct phone number in order to complete a PA for the pts Saxenda. Please advise.     [phone]

## 2021-01-17 NOTE — Telephone Encounter (Signed)
Sent to PCP ?

## 2021-01-22 ENCOUNTER — Telehealth: Payer: Self-pay | Admitting: Gastroenterology

## 2021-01-22 NOTE — Telephone Encounter (Signed)
Colon is scheduled for tomorrow-she is doing clear liquids- had water, jello, chicken broth, - pt states she feels light headed- instructed her to increase her fluids, drink every hour throughtout the rest of today- get some popsicles, gatorade, she has Mt Dew and Pepsi at home- Instructed to drink some of either now-  drink more broth- told her sweet tea- she verbalized understanding and will CB if she starts to feeel worse

## 2021-01-22 NOTE — Telephone Encounter (Signed)
Inbound call from pt requesting a call back stating that she has some questions regarding liquids the day before her procedure. Please advise. Thank you.

## 2021-01-22 NOTE — Telephone Encounter (Signed)
Patient acknowledged that she understood that the PA was denied at this time.

## 2021-01-23 ENCOUNTER — Ambulatory Visit (HOSPITAL_COMMUNITY): Payer: No Typology Code available for payment source | Admitting: Certified Registered"

## 2021-01-23 ENCOUNTER — Encounter (HOSPITAL_COMMUNITY): Payer: Self-pay | Admitting: Gastroenterology

## 2021-01-23 ENCOUNTER — Ambulatory Visit (HOSPITAL_COMMUNITY)
Admission: RE | Admit: 2021-01-23 | Discharge: 2021-01-23 | Disposition: A | Discharge status: Home / Self Care | Payer: No Typology Code available for payment source | Source: Ambulatory Visit | Attending: Gastroenterology | Admitting: Gastroenterology

## 2021-01-23 ENCOUNTER — Other Ambulatory Visit: Payer: Self-pay

## 2021-01-23 ENCOUNTER — Encounter (HOSPITAL_COMMUNITY)
Admission: RE | Disposition: A | Discharge status: Home / Self Care | Payer: Self-pay | Source: Ambulatory Visit | Attending: Gastroenterology

## 2021-01-23 DIAGNOSIS — Z888 Allergy status to other drugs, medicaments and biological substances status: Secondary | ICD-10-CM | POA: Insufficient documentation

## 2021-01-23 DIAGNOSIS — D125 Benign neoplasm of sigmoid colon: Secondary | ICD-10-CM

## 2021-01-23 DIAGNOSIS — Z8249 Family history of ischemic heart disease and other diseases of the circulatory system: Secondary | ICD-10-CM | POA: Insufficient documentation

## 2021-01-23 DIAGNOSIS — F17210 Nicotine dependence, cigarettes, uncomplicated: Secondary | ICD-10-CM | POA: Diagnosis not present

## 2021-01-23 DIAGNOSIS — Z8371 Family history of colonic polyps: Secondary | ICD-10-CM | POA: Insufficient documentation

## 2021-01-23 DIAGNOSIS — K635 Polyp of colon: Secondary | ICD-10-CM | POA: Diagnosis not present

## 2021-01-23 DIAGNOSIS — Z91040 Latex allergy status: Secondary | ICD-10-CM | POA: Insufficient documentation

## 2021-01-23 DIAGNOSIS — Z79899 Other long term (current) drug therapy: Secondary | ICD-10-CM | POA: Diagnosis not present

## 2021-01-23 DIAGNOSIS — E119 Type 2 diabetes mellitus without complications: Secondary | ICD-10-CM | POA: Diagnosis not present

## 2021-01-23 DIAGNOSIS — Z1211 Encounter for screening for malignant neoplasm of colon: Secondary | ICD-10-CM | POA: Diagnosis not present

## 2021-01-23 DIAGNOSIS — D175 Benign lipomatous neoplasm of intra-abdominal organs: Secondary | ICD-10-CM | POA: Diagnosis not present

## 2021-01-23 DIAGNOSIS — Z825 Family history of asthma and other chronic lower respiratory diseases: Secondary | ICD-10-CM | POA: Diagnosis not present

## 2021-01-23 DIAGNOSIS — D1779 Benign lipomatous neoplasm of other sites: Secondary | ICD-10-CM | POA: Diagnosis not present

## 2021-01-23 DIAGNOSIS — K5730 Diverticulosis of large intestine without perforation or abscess without bleeding: Secondary | ICD-10-CM | POA: Diagnosis not present

## 2021-01-23 DIAGNOSIS — K640 First degree hemorrhoids: Secondary | ICD-10-CM | POA: Insufficient documentation

## 2021-01-23 HISTORY — PX: POLYPECTOMY: SHX5525

## 2021-01-23 HISTORY — PX: COLONOSCOPY WITH PROPOFOL: SHX5780

## 2021-01-23 SURGERY — COLONOSCOPY WITH PROPOFOL
Anesthesia: Monitor Anesthesia Care

## 2021-01-23 MED ORDER — ONDANSETRON HCL 4 MG/2ML IJ SOLN
INTRAMUSCULAR | Status: DC | PRN
  Administered 2021-01-23: 4 mg via INTRAVENOUS

## 2021-01-23 MED ORDER — LACTATED RINGERS IV SOLN
INTRAVENOUS | Status: DC | PRN
Start: 2021-01-23 — End: 2021-01-23

## 2021-01-23 MED ORDER — PROPOFOL 1000 MG/100ML IV EMUL
INTRAVENOUS | Status: AC
  Filled 2021-01-23: qty 100

## 2021-01-23 MED ORDER — PROPOFOL 10 MG/ML IV BOLUS
INTRAVENOUS | Status: DC | PRN
  Administered 2021-01-23: 30 mg via INTRAVENOUS

## 2021-01-23 MED ORDER — LIDOCAINE 2% (20 MG/ML) 5 ML SYRINGE
INTRAMUSCULAR | Status: DC | PRN
  Administered 2021-01-23: 100 mg via INTRAVENOUS

## 2021-01-23 MED ORDER — SODIUM CHLORIDE 0.9 % IV SOLN: INTRAVENOUS | Status: DC

## 2021-01-23 MED ORDER — PROPOFOL 500 MG/50ML IV EMUL
INTRAVENOUS | Status: DC | PRN
  Administered 2021-01-23: 125 ug/kg/min via INTRAVENOUS

## 2021-01-23 SURGICAL SUPPLY — 21 items

## 2021-01-23 NOTE — Discharge Instructions (Signed)

## 2021-01-23 NOTE — Anesthesia Preprocedure Evaluation (Signed)
Anesthesia Evaluation  Patient identified by MRN, date of birth, ID band Patient awake    Reviewed: Allergy & Precautions, NPO status , Patient's Chart, lab work & pertinent test results  Airway Mallampati: II  TM Distance: >3 FB Neck ROM: Full    Dental  (+) Teeth Intact   Pulmonary sleep apnea , COPD,  COPD inhaler, Current Smoker,    Pulmonary exam normal        Cardiovascular hypertension, Pt. on medications  Rhythm:Regular Rate:Normal     Neuro/Psych Anxiety Depression negative neurological ROS     GI/Hepatic Neg liver ROS, CRC screening   Endo/Other  diabetesMorbid obesity  Renal/GU negative Renal ROS  negative genitourinary   Musculoskeletal negative musculoskeletal ROS (+)   Abdominal (+) + obese,   Peds  Hematology  (+) anemia ,   Anesthesia Other Findings   Reproductive/Obstetrics                            Anesthesia Physical Anesthesia Plan  ASA: 3  Anesthesia Plan: MAC   Post-op Pain Management:    Induction: Intravenous  PONV Risk Score and Plan: 1 and Propofol infusion and Treatment may vary due to age or medical condition  Airway Management Planned: Simple Face Mask, Natural Airway and Nasal Cannula  Additional Equipment: None  Intra-op Plan:   Post-operative Plan:   Informed Consent: I have reviewed the patients History and Physical, chart, labs and discussed the procedure including the risks, benefits and alternatives for the proposed anesthesia with the patient or authorized representative who has indicated his/her understanding and acceptance.     Dental advisory given  Plan Discussed with: CRNA  Anesthesia Plan Comments: (Lab Results      Component                Value               Date                      WBC                      8.0                 01/16/2021                HGB                      14.3                01/16/2021                 HCT                      43.8                01/16/2021                MCV                      92                  01/16/2021                PLT                      274  01/16/2021           Lab Results      Component                Value               Date                      NA                       141                 01/16/2021                K                        4.4                 01/16/2021                CO2                      26                  01/16/2021                GLUCOSE                  124 (H)             01/16/2021                BUN                      13                  01/16/2021                CREATININE               0.89                01/16/2021                CALCIUM                  9.8                 01/16/2021                EGFR                     79                  01/16/2021                GFRNONAA                 >60                 07/20/2020          )        Anesthesia Quick Evaluation

## 2021-01-23 NOTE — Transfer of Care (Signed)
Immediate Anesthesia Transfer of Care Note  Patient: Catherine [name]  Procedure(s) Performed: COLONOSCOPY WITH PROPOFOL POLYPECTOMY  Patient Location: PACU  Anesthesia Type:MAC  Level of Consciousness: awake, alert  and oriented  Airway & Oxygen Therapy: Patient Spontanous Breathing and Patient connected to face mask oxygen  Post-op Assessment: Report given to RN and Post -op Vital signs reviewed and stable  Post vital signs: Reviewed and stable  Last Vitals:  Vitals Value Taken Time  BP    Temp    Pulse    Resp    SpO2      Last Pain:  Vitals:   01/23/21 0939  TempSrc: Oral  PainSc: 0-No pain         Complications: No notable events documented.

## 2021-01-23 NOTE — Anesthesia Postprocedure Evaluation (Signed)
Anesthesia Post Note  Patient: Catherine [name]  Procedure(s) Performed: COLONOSCOPY WITH PROPOFOL POLYPECTOMY     Patient location during evaluation: Endoscopy Anesthesia Type: MAC Level of consciousness: awake and alert Pain management: pain level controlled Vital Signs Assessment: post-procedure vital signs reviewed and stable Respiratory status: spontaneous breathing, nonlabored ventilation, respiratory function stable and patient connected to nasal cannula oxygen Cardiovascular status: stable and blood pressure returned to baseline Postop Assessment: no apparent nausea or vomiting Anesthetic complications: no   No notable events documented.  Last Vitals:  Vitals:   01/23/21 1030 01/23/21 1040  BP: 139/76 (!) 146/77  Pulse: 89 75  Resp: 17 16  Temp:    SpO2: 100% 97%    Last Pain:  Vitals:   01/23/21 1040  TempSrc:   PainSc: 0-No pain                 Belenda Cruise P Dorothia Passmore

## 2021-01-23 NOTE — Op Note (Signed)
[facility] Patient Name: Catherine [name] Procedure Date: 01/23/2021 MRN: [id number] Attending MD: Jackquline Denmark , MD Date of Birth: 23-Oct-1970 CSN: [id number] Age: 50 Admit Type: Inpatient Procedure:                Colonoscopy Indications:              Colon cancer screening in patient at increased                            risk: Family history of colon polyps (mom) Providers:                Jackquline Denmark, MD, Jaci Carrel, RN, Doristine Johns, RN, Benetta Spar, Technician Referring MD:              Medicines:                Monitored Anesthesia Care Complications:            No immediate complications. Estimated Blood Loss:     Estimated blood loss: none. Procedure:                Pre-Anesthesia Assessment:                           - Prior to the procedure, a History and Physical                            was performed, and patient medications and                            allergies were reviewed. The patient's tolerance of                            previous anesthesia was also reviewed. The risks                            and benefits of the procedure and the sedation                            options and risks were discussed with the patient.                            All questions were answered, and informed consent                            was obtained. Prior Anticoagulants: The patient has                            taken no previous anticoagulant or antiplatelet                            agents. ASA Grade Assessment: III - A patient with  severe systemic disease. After reviewing the risks                            and benefits, the patient was deemed in                            satisfactory condition to undergo the procedure.                           After obtaining informed consent, the colonoscope                            was passed under direct vision. Throughout the                             procedure, the patient's blood pressure, pulse, and                            oxygen saturations were monitored continuously. The                            CF-HQ190L ([id number]) Olympus colonoscope was                            introduced through the anus and advanced to the the                            cecum, identified by appendiceal orifice and                            ileocecal valve. The colonoscopy was performed                            without difficulty. The patient tolerated the                            procedure well. The quality of the bowel                            preparation was good. The ileocecal valve,                            appendiceal orifice, and rectum were photographed. Scope In: 9:55:30 AM Scope Out: 10:13:43 AM Scope Withdrawal Time: 0 hours 11 minutes 37 seconds  Total Procedure Duration: 0 hours 18 minutes 13 seconds  Findings:      A 6 mm polyp was found in the mid sigmoid colon. The polyp was sessile.       The polyp was removed with a cold snare. Resection and retrieval were       complete.      Few medium-mouthed diverticula were found in the sigmoid colon,       transverse colon and ascending colon.      There was a lipoma, 10 mm in diameter, in the distal ascending colon.      Non-bleeding internal hemorrhoids  were found during retroflexion. The       hemorrhoids were moderate and Grade I (internal hemorrhoids that do not       prolapse).      The exam was otherwise without abnormality on direct and retroflexion       views. Impression:               - One 6 mm polyp in the mid sigmoid colon, removed                            with a cold snare. Resected and retrieved.                           - Mild pancolonic diverticulosis.                           - Incidental lipoma in the distal ascending colon.                           - Non-bleeding internal hemorrhoids.                           - The examination was otherwise normal on  direct                            and retroflexion views. Moderate Sedation:      Not Applicable - Patient had care per Anesthesia. Recommendation:           - Patient has a contact number available for                            emergencies. The signs and symptoms of potential                            delayed complications were discussed with the                            patient. Return to normal activities tomorrow.                            Written discharge instructions were provided to the                            patient.                           - High fiber diet.                           - Continue present medications.                           - Await pathology results.                           - Repeat colonoscopy in 5 years for screening  purposes d/t FH polyps. Earlier, if with any new                            problems or change in family history.                           - The findings and recommendations were discussed                            with the patient's family. Procedure Code(s):        --- Professional ---                           [phone], Colonoscopy, flexible; with removal of                            tumor(s), polyp(s), or other lesion(s) by snare                            technique Diagnosis Code(s):        --- Professional ---                           Z83.71, Family history of colonic polyps                           K63.5, Polyp of colon                           K64.0, First degree hemorrhoids                           D17.5, Benign lipomatous neoplasm of                            intra-abdominal organs                           K57.30, Diverticulosis of large intestine without                            perforation or abscess without bleeding CPT copyright 2019 American Medical Association. All rights reserved. The codes documented in this report are preliminary and upon coder review may  be revised to meet  current compliance requirements. Jackquline Denmark, MD 01/23/2021 10:22:15 AM This report has been signed electronically. Number of Addenda: 0

## 2021-01-23 NOTE — H&P (Signed)
[facility] History and Physical   Primary Care Physician:  Kerin Perna, NP   Reason for Procedure:  Colorectal cancer screening/family history of colon polyps (mom)  Plan:    Colonoscopy     HPI: Catherine [name] is a 50 y.o. female  No nausea, vomiting, heartburn, regurgitation, odynophagia or dysphagia.  No significant diarrhea or constipation.  No melena or hematochezia. No unintentional weight loss. No abdominal pain.   Past Medical History:  Diagnosis Date   Allergy    seasonal allergies   Anemia    hx of   Anxiety    on meds   Blood transfusion without reported diagnosis    x 3   Bronchitis    Depression    on meds   Diabetes mellitus without complication ([place])    on meds   Hyperlipidemia    on meds   Hypertension    on meds   Sleep apnea    no CPAP machine being used at this time (08/19/2020)    Past Surgical History:  Procedure Laterality Date   BUNIONECTOMY  2012   CESAREAN SECTION     X 2   LAPAROSCOPIC VAGINAL HYSTERECTOMY  2007   pt still has tubes and ovaries   LAVH  2009   Leiomyoma and menorrhagia   MYOMECTOMY  2005   WISDOM TOOTH EXTRACTION      Prior to Admission medications   Medication Sig Start Date End Date Taking? Authorizing Provider  acetaminophen (TYLENOL) 500 MG tablet Take 1,000 mg by mouth every 6 (six) hours as needed for moderate pain or headache.   Yes [provider]  albuterol (VENTOLIN HFA) 108 (90 Base) MCG/ACT inhaler INHALE 1-2 PUFFS BY MOUTH EVERY 6 HOURS AS NEEDED FOR WHEEZE OR SHORTNESS OF BREATH 08/23/20  Yes Kerin Perna, NP  amLODipine (NORVASC) 10 MG tablet Take 1 tablet (10 mg total) by mouth daily. 08/11/20  Yes Kerin Perna, NP  busPIRone (BUSPAR) 7.5 MG tablet Take 1 tablet (7.5 mg total) by mouth 3 (three) times daily. 01/16/21  Yes Kerin Perna, NP  escitalopram (LEXAPRO) 20 MG tablet Take 1 tablet (20 mg total) by mouth daily. 01/16/21  Yes Kerin Perna, NP   fluticasone (FLONASE) 50 MCG/ACT nasal spray Place 1 spray into both nostrils daily as needed for allergies. 01/25/20  Yes [provider]  hydrochlorothiazide (HYDRODIURIL) 25 MG tablet TAKE 1 TABLET EVERY MORNING 12/10/20  Yes Fenton Foy, NP  losartan (COZAAR) 50 MG tablet TAKE 2 TABLETS BY MOUTH EVERY DAY 12/10/20  Yes Fenton Foy, NP  Liraglutide -Weight Management (SAXENDA) 18 MG/3ML SOPN Inject 3 mg into the skin daily. 0.6 mg daily for 1 week, 1.2 mg daily for the next week, 1.8 mg daily for next week. If tolerated, can further increase to 2.4 mg daily. 01/16/21   Kerin Perna, NP  Sodium Sulfate-Mag Sulfate-KCl (SUTAB) [id number] MG TABS Take 1 kit by mouth as directed. MANUFACTURER CODES!! BIN: [id number] PCN: CN GROUP: [id number] MEMBER ID: [id number];[employer] AS SECONDARY INSURANCE ;NO PRIOR AUTHORIZATION 01/14/21   Jackquline Denmark, MD  spironolactone (ALDACTONE) 25 MG tablet Take 1 tablet (25 mg total) by mouth daily. Patient not taking: No sig reported 06/26/20   Kerin Perna, NP    Current Facility-Administered Medications  Medication Dose Route Frequency Provider Last Rate Last Admin   0.9 %  sodium chloride infusion   Intravenous Continuous Jackquline Denmark, MD  Allergies as of 11/18/2020 - Review Complete 11/12/2020  Allergen Reaction Noted   Metformin and related Other (See Comments) 01/04/2018   Latex Rash 11/24/2010    Family History  Problem Relation Age of Onset   Hypertension Mother    Asthma Mother    Colon polyps Mother    Hypertension Father    Heart attack Father        died at 40 of an MI   Hypertension Sister    Asthma Daughter    Colon cancer Neg Hx    Esophageal cancer Neg Hx    Stomach cancer Neg Hx    Rectal cancer Neg Hx     Social History   Socioeconomic History   Marital status: Single    Spouse name: Not on file   Number of children: Not on file   Years of education: Not on file   Highest education level: Not on  file  Occupational History   Not on file  Tobacco Use   Smoking status: Every Day    Packs/day: 0.50    Types: Cigarettes   Smokeless tobacco: Never  Vaping Use   Vaping Use: Never used  Substance and Sexual Activity   Alcohol use: Yes    Comment: 1-2 times per month   Drug use: No   Sexual activity: Yes    Birth control/protection: Surgical    Comment: 1st intercourse 50 yo-More than 5 partners-HYST  Other Topics Concern   Not on file  Social History Narrative   Not on file   Social Determinants of Health   Financial Resource Strain: Not on file  Food Insecurity: Not on file  Transportation Needs: Not on file  Physical Activity: Not on file  Stress: Not on file  Social Connections: Not on file  Intimate Partner Violence: Not on file    Review of Systems: neg All other review of systems negative except as mentioned in the HPI.  Physical Exam: Vital signs in last 24 hours:     General:   Alert,  Well-developed, well-nourished, pleasant and cooperative in NAD Lungs:  Clear throughout to auscultation.   Heart:  Regular rate and rhythm; no murmurs, clicks, rubs,  or gallops. Abdomen:  Soft, nontender and nondistended. Normal bowel sounds.   Neuro/Psych:  Alert and cooperative. Normal mood and affect. A and O x 3     Carmell Austria, MD. [facility] 01/23/2021 9:34 AM@

## 2021-01-24 ENCOUNTER — Encounter (HOSPITAL_COMMUNITY): Payer: Self-pay | Admitting: Gastroenterology

## 2021-01-24 LAB — SURGICAL PATHOLOGY

## 2021-01-25 ENCOUNTER — Other Ambulatory Visit (INDEPENDENT_AMBULATORY_CARE_PROVIDER_SITE_OTHER): Payer: Self-pay | Admitting: Primary Care

## 2021-01-25 DIAGNOSIS — Z760 Encounter for issue of repeat prescription: Secondary | ICD-10-CM

## 2021-01-25 NOTE — Telephone Encounter (Signed)
Requested medications are due for refill today no  Requested medications are on the active medication list no  Last refill 10/29/20  Last visit 01/16/21  Future visit scheduled no  Notes to clinic Med is not on current med list

## 2021-01-27 ENCOUNTER — Telehealth: Payer: Self-pay

## 2021-01-27 ENCOUNTER — Encounter: Payer: Self-pay | Admitting: Gastroenterology

## 2021-01-27 NOTE — Telephone Encounter (Signed)
Letter sent to pt via My Chart and through the mail regarding the results from your Colonoscopy.

## 2021-01-28 ENCOUNTER — Other Ambulatory Visit (INDEPENDENT_AMBULATORY_CARE_PROVIDER_SITE_OTHER): Payer: Self-pay | Admitting: Primary Care

## 2021-02-13 ENCOUNTER — Ambulatory Visit: Payer: No Typology Code available for payment source | Admitting: Pharmacist

## 2021-02-17 ENCOUNTER — Telehealth (INDEPENDENT_AMBULATORY_CARE_PROVIDER_SITE_OTHER): Payer: Self-pay

## 2021-02-17 NOTE — Telephone Encounter (Signed)
Copied from [place] [id number]. Topic: General - Other >> Feb 17, 2021  2:07 PM Pawlus, Brayton Layman A wrote: Reason for CRM: Pt requested a call back to go over her medications, pt had some questions regarding a PA for [employer].

## 2021-02-22 DIAGNOSIS — Z20822 Contact with and (suspected) exposure to covid-19: Secondary | ICD-10-CM | POA: Diagnosis not present

## 2021-02-25 DIAGNOSIS — R062 Wheezing: Secondary | ICD-10-CM | POA: Diagnosis not present

## 2021-02-25 DIAGNOSIS — R051 Acute cough: Secondary | ICD-10-CM | POA: Diagnosis not present

## 2021-03-18 ENCOUNTER — Other Ambulatory Visit (INDEPENDENT_AMBULATORY_CARE_PROVIDER_SITE_OTHER): Payer: Self-pay | Admitting: Nurse Practitioner

## 2021-03-18 DIAGNOSIS — Z760 Encounter for issue of repeat prescription: Secondary | ICD-10-CM

## 2021-03-27 ENCOUNTER — Other Ambulatory Visit (INDEPENDENT_AMBULATORY_CARE_PROVIDER_SITE_OTHER): Payer: Self-pay | Admitting: Primary Care

## 2021-03-27 ENCOUNTER — Ambulatory Visit (INDEPENDENT_AMBULATORY_CARE_PROVIDER_SITE_OTHER): Payer: Self-pay

## 2021-03-27 DIAGNOSIS — Z760 Encounter for issue of repeat prescription: Secondary | ICD-10-CM

## 2021-03-27 NOTE — Telephone Encounter (Signed)
Requested Prescriptions  Pending Prescriptions Disp Refills  . albuterol (VENTOLIN HFA) 108 (90 Base) MCG/ACT inhaler [Pharmacy Med Name: ALBUTEROL HFA (PROVENTIL) INH] 6.7 each 1    Sig: INHALE 2 PUFFS INTO THE LUNGS 4 TIMES A DAY     Pulmonology:  Beta Agonists Failed - 03/27/2021  1:22 AM      Failed - One inhaler should last at least one month. If the patient is requesting refills earlier, contact the patient to check for uncontrolled symptoms.      Passed - Valid encounter within last 12 months    Recent Outpatient Visits          2 months ago Type 2 diabetes mellitus without complication, without long-term current use of insulin ([place])   [place], Michelle P, NP   8 months ago Hospital discharge follow-up   [place], [place], NP   9 months ago Colon cancer screening   [place], Michelle P, NP   10 months ago Depression with anxiety   [place] Kerin Perna, NP   1 year ago Type 2 diabetes mellitus without complication, without long-term current use of insulin ([place])   [place], [place], NP      Future Appointments            In 4 days Kerin Perna, NP [place]

## 2021-03-27 NOTE — Telephone Encounter (Signed)
Pt. Reports she has had a cough x 1 week. Dry cough. States she is out of "my inhalers." States "I'm afraid I could have bronchitis." States she called EMS at 0500 this morning and "they told me to call my PCP." Pt. Asking to be seen in office today or tomorrow. Please advise.    Answer Assessment - Initial Assessment Questions 1. RESPIRATORY STATUS: "Describe your breathing?" (e.g., wheezing, shortness of breath, unable to speak, severe coughing)      Shortness of breath 2. ONSET: "When did this breathing problem begin?"      1 week ago 3. PATTERN "Does the difficult breathing come and go, or has it been constant since it started?"      Comes and goes 4. SEVERITY: "How bad is your breathing?" (e.g., mild, moderate, severe)    - MILD: No SOB at rest, mild SOB with walking, speaks normally in sentences, can lie down, no retractions, pulse < 100.    - MODERATE: SOB at rest, SOB with minimal exertion and prefers to sit, cannot lie down flat, speaks in phrases, mild retractions, audible wheezing, pulse 100-120.    - SEVERE: Very SOB at rest, speaks in single words, struggling to breathe, sitting hunched forward, retractions, pulse > 120      Comes and goes 5. RECURRENT SYMPTOM: "Have you had difficulty breathing before?" If Yes, ask: "When was the last time?" and "What happened that time?"      No 6. CARDIAC HISTORY: "Do you have any history of heart disease?" (e.g., heart attack, angina, bypass surgery, angioplasty)      No 7. LUNG HISTORY: "Do you have any history of lung disease?"  (e.g., pulmonary embolus, asthma, emphysema)     No 8. CAUSE: "What do you think is causing the breathing problem?"      Maybe bronchitis 9. OTHER SYMPTOMS: "Do you have any other symptoms? (e.g., dizziness, runny nose, cough, chest pain, fever)     Dry cough 10. O2 SATURATION MONITOR:  "Do you use an oxygen saturation monitor (pulse oximeter) at home?" If Yes, "What is your reading (oxygen level) today?" "What is  your usual oxygen saturation reading?" (e.g., 95%)       No 11. PREGNANCY: "Is there any chance you are pregnant?" "When was your last menstrual period?"       No 12. TRAVEL: "Have you traveled out of the country in the last month?" (e.g., travel history, exposures)       No  Protocols used: Breathing Difficulty-A-AH

## 2021-03-31 ENCOUNTER — Ambulatory Visit (INDEPENDENT_AMBULATORY_CARE_PROVIDER_SITE_OTHER): Payer: No Typology Code available for payment source | Admitting: Primary Care

## 2021-03-31 ENCOUNTER — Other Ambulatory Visit (INDEPENDENT_AMBULATORY_CARE_PROVIDER_SITE_OTHER): Payer: Self-pay | Admitting: Primary Care

## 2021-03-31 ENCOUNTER — Encounter (INDEPENDENT_AMBULATORY_CARE_PROVIDER_SITE_OTHER): Payer: Self-pay | Admitting: Primary Care

## 2021-03-31 DIAGNOSIS — Z760 Encounter for issue of repeat prescription: Secondary | ICD-10-CM

## 2021-03-31 MED ORDER — FLUTICASONE PROPIONATE 50 MCG/ACT NA SUSP: 1.0000 | Freq: Every day | NASAL | 3 refills | Status: DC | PRN

## 2021-04-01 ENCOUNTER — Ambulatory Visit (INDEPENDENT_AMBULATORY_CARE_PROVIDER_SITE_OTHER): Payer: Self-pay

## 2021-04-01 MED ORDER — ESCITALOPRAM OXALATE 20 MG PO TABS: 20.0000 mg | ORAL_TABLET | Freq: Every day | ORAL | 1 refills | Status: DC

## 2021-04-01 MED ORDER — GLIMEPIRIDE 2 MG PO TABS: ORAL_TABLET | ORAL | 0 refills | Status: DC

## 2021-04-01 MED ORDER — AMLODIPINE BESYLATE 10 MG PO TABS: 10.0000 mg | ORAL_TABLET | Freq: Every day | ORAL | 3 refills | Status: DC

## 2021-04-01 MED ORDER — HYDROCHLOROTHIAZIDE 25 MG PO TABS: ORAL_TABLET | ORAL | 0 refills | Status: DC

## 2021-04-01 NOTE — Telephone Encounter (Signed)
°  Chief Complaint: ankle swelling Symptoms: feet and ankle swelling Frequency: 1 week Pertinent Negatives: Patient denies pain or swelling above the ankle Disposition: [] ED /[] Urgent Care (no appt availability in office) / [x] Appointment(In office/virtual)/ []  [place] [facility]/ [] Home Care/ [] Refused Recommended Disposition  Additional Notes: Pt's appt from yesterday was rescheduled to 04/09/21. Pt is requesting medication be filled enough to get her to appt d/t being out for a week. Refill request was sent over yesterday. I reached out to [place], CMA who states that Dr. Oletta Lamas is working on that now and will send to pharmacy. Advised pt to check with pharmacy in a hr to see if medication request was received so she can get it filled today. Pt verbalized understanding.  Summary: retaining fluid due to no refill   Pt called to get refill today for hydrochlorothiazide (HYDRODIURIL) 25 MG tablet  Pt states she is retaining fluid and has been out of this medication for a few days / please advise         Reason for Disposition  [1] MILD swelling of both ankles (i.e., pedal edema) AND [2] new-onset or worsening  Answer Assessment - Initial Assessment Questions 1. ONSET: "When did the swelling start?" (e.g., minutes, hours, days)     1 week 2. LOCATION: "What part of the leg is swollen?"  "Are both legs swollen or just one leg?"     ankles 3. SEVERITY: "How bad is the swelling?" (e.g., localized; mild, moderate, severe)  - Localized - small area of swelling localized to one leg  - MILD pedal edema - swelling limited to foot and ankle, pitting edema < 1/4 inch (6 mm) deep, rest and elevation eliminate most or all swelling  - MODERATE edema - swelling of lower leg to knee, pitting edema > 1/4 inch (6 mm) deep, rest and elevation only partially reduce swelling  - SEVERE edema - swelling extends above knee, facial or hand swelling present      Moderate  4. REDNESS: "Does the swelling look  red or infected?"     No 5. PAIN: "Is the swelling painful to touch?" If Yes, ask: "How painful is it?"   (Scale 1-10; mild, moderate or severe)     No 7. CAUSE: "What do you think is causing the leg swelling?"     Been out of medicine for 1 week  Protocols used: Leg Swelling and Edema-A-AH

## 2021-04-01 NOTE — Telephone Encounter (Signed)
Requested medication (s) are due for refill today - yes  Requested medication (s) are on the active medication list -yes  Future visit scheduled -yes  Last refill: 12/10/20 #90  Notes to clinic: Request RF: Rx prescribed by outside provider, has notes- needs appointment prior to RF  Requested Prescriptions  Pending Prescriptions Disp Refills   hydrochlorothiazide (HYDRODIURIL) 25 MG tablet 90 tablet 0    Sig: TAKE 1 TABLET EVERY MORNING     Cardiovascular: Diuretics - Thiazide Failed - 04/01/2021 12:28 PM      Failed - Last BP in normal range    BP Readings from Last 1 Encounters:  01/23/21 (!) 146/77          Passed - Ca in normal range and within 360 days    Calcium  Date Value Ref Range Status  01/16/2021 9.8 8.7 - 10.2 mg/dL Final   Calcium, Ion  Date Value Ref Range Status  03/01/2013 1.05 (L) 1.12 - 1.23 mmol/L Final          Passed - Cr in normal range and within 360 days    Creat  Date Value Ref Range Status  05/17/2014 0.81 0.50 - 1.10 mg/dL Final   Creatinine, Ser  Date Value Ref Range Status  01/16/2021 0.89 0.57 - 1.00 mg/dL Final          Passed - K in normal range and within 360 days    Potassium  Date Value Ref Range Status  01/16/2021 4.4 3.5 - 5.2 mmol/L Final          Passed - Na in normal range and within 360 days    Sodium  Date Value Ref Range Status  01/16/2021 141 134 - 144 mmol/L Final          Passed - Valid encounter within last 6 months    Recent Outpatient Visits           2 months ago Type 2 diabetes mellitus without complication, without long-term current use of insulin ([place])   [place], Michelle P, NP   8 months ago Hospital discharge follow-up   [place], Michelle P, NP   9 months ago Colon cancer screening   [place], Michelle P, NP   10 months ago Depression with anxiety   [facility]  Kerin Perna, NP   1 year ago Type 2 diabetes mellitus without complication, without long-term current use of insulin ([place])   [place], Michelle P, NP       Future Appointments             In 1 week Kerin Perna, NP [facility]               Requested Prescriptions  Pending Prescriptions Disp Refills   hydrochlorothiazide (HYDRODIURIL) 25 MG tablet 90 tablet 0    Sig: TAKE 1 TABLET EVERY MORNING     Cardiovascular: Diuretics - Thiazide Failed - 04/01/2021 12:28 PM      Failed - Last BP in normal range    BP Readings from Last 1 Encounters:  01/23/21 (!) 146/77          Passed - Ca in normal range and within 360 days    Calcium  Date Value Ref Range Status  01/16/2021 9.8 8.7 - 10.2 mg/dL Final   Calcium, Ion  Date Value  Ref Range Status  03/01/2013 1.05 (L) 1.12 - 1.23 mmol/L Final          Passed - Cr in normal range and within 360 days    Creat  Date Value Ref Range Status  05/17/2014 0.81 0.50 - 1.10 mg/dL Final   Creatinine, Ser  Date Value Ref Range Status  01/16/2021 0.89 0.57 - 1.00 mg/dL Final          Passed - K in normal range and within 360 days    Potassium  Date Value Ref Range Status  01/16/2021 4.4 3.5 - 5.2 mmol/L Final          Passed - Na in normal range and within 360 days    Sodium  Date Value Ref Range Status  01/16/2021 141 134 - 144 mmol/L Final          Passed - Valid encounter within last 6 months    Recent Outpatient Visits           2 months ago Type 2 diabetes mellitus without complication, without long-term current use of insulin ([place])   [place] [facility] Kerin Perna, NP   8 months ago Hospital discharge follow-up   [place], Michelle P, NP   9 months ago Colon cancer screening   [place], Michelle P, NP   10 months ago Depression with anxiety   [place] Kerin Perna, NP   1 year ago Type 2 diabetes mellitus without complication, without long-term current use of insulin ([place])   [place], [place], NP       Future Appointments             In 1 week Oletta Lamas [place], NP [place]

## 2021-04-01 NOTE — Telephone Encounter (Signed)
Pt called and states she needs refill for hydrochlorothiazide (HYDRODIURIL) 25 MG tablet sent to the [facility] on [address] in [place] / please advise   Pt has contacted pharmacy and there are no refills

## 2021-04-06 MED ORDER — AMLODIPINE BESYLATE 10 MG PO TABS: 10.0000 mg | ORAL_TABLET | Freq: Every day | ORAL | 3 refills | Status: DC

## 2021-04-08 DIAGNOSIS — M9905 Segmental and somatic dysfunction of pelvic region: Secondary | ICD-10-CM | POA: Diagnosis not present

## 2021-04-08 DIAGNOSIS — M25552 Pain in left hip: Secondary | ICD-10-CM | POA: Diagnosis not present

## 2021-04-08 DIAGNOSIS — M9903 Segmental and somatic dysfunction of lumbar region: Secondary | ICD-10-CM | POA: Diagnosis not present

## 2021-04-08 DIAGNOSIS — M5137 Other intervertebral disc degeneration, lumbosacral region: Secondary | ICD-10-CM | POA: Diagnosis not present

## 2021-04-09 ENCOUNTER — Other Ambulatory Visit: Payer: Self-pay

## 2021-04-09 ENCOUNTER — Other Ambulatory Visit (HOSPITAL_COMMUNITY)
Admission: RE | Admit: 2021-04-09 | Discharge: 2021-04-09 | Disposition: A | Discharge status: Home / Self Care | Payer: No Typology Code available for payment source | Source: Ambulatory Visit | Attending: Primary Care | Admitting: Primary Care

## 2021-04-09 ENCOUNTER — Ambulatory Visit (INDEPENDENT_AMBULATORY_CARE_PROVIDER_SITE_OTHER): Payer: No Typology Code available for payment source | Admitting: Primary Care

## 2021-04-09 ENCOUNTER — Encounter (INDEPENDENT_AMBULATORY_CARE_PROVIDER_SITE_OTHER): Payer: Self-pay | Admitting: Primary Care

## 2021-04-09 VITALS — BP 128/85 | HR 72 | Temp 97.5°F | Wt 310.2 lb

## 2021-04-09 DIAGNOSIS — I10 Essential (primary) hypertension: Secondary | ICD-10-CM

## 2021-04-09 DIAGNOSIS — Z113 Encounter for screening for infections with a predominantly sexual mode of transmission: Secondary | ICD-10-CM | POA: Diagnosis not present

## 2021-04-09 DIAGNOSIS — Z760 Encounter for issue of repeat prescription: Secondary | ICD-10-CM | POA: Diagnosis not present

## 2021-04-09 DIAGNOSIS — Z23 Encounter for immunization: Secondary | ICD-10-CM

## 2021-04-09 DIAGNOSIS — Z0001 Encounter for general adult medical examination with abnormal findings: Secondary | ICD-10-CM

## 2021-04-09 DIAGNOSIS — Z114 Encounter for screening for human immunodeficiency virus [HIV]: Secondary | ICD-10-CM

## 2021-04-09 DIAGNOSIS — R69 Illness, unspecified: Secondary | ICD-10-CM | POA: Diagnosis not present

## 2021-04-09 DIAGNOSIS — E119 Type 2 diabetes mellitus without complications: Secondary | ICD-10-CM

## 2021-04-09 DIAGNOSIS — Z124 Encounter for screening for malignant neoplasm of cervix: Secondary | ICD-10-CM

## 2021-04-09 DIAGNOSIS — Z1211 Encounter for screening for malignant neoplasm of colon: Secondary | ICD-10-CM

## 2021-04-09 LAB — POCT GLYCOSYLATED HEMOGLOBIN (HGB A1C): Hemoglobin A1C: 6.8 % — AB (ref 4.0–5.6)

## 2021-04-09 MED ORDER — HYDROCHLOROTHIAZIDE 25 MG PO TABS: ORAL_TABLET | ORAL | 1 refills | Status: DC

## 2021-04-09 MED ORDER — LOSARTAN POTASSIUM 50 MG PO TABS: 100.0000 mg | ORAL_TABLET | Freq: Every day | ORAL | 1 refills | Status: DC

## 2021-04-10 ENCOUNTER — Encounter (INDEPENDENT_AMBULATORY_CARE_PROVIDER_SITE_OTHER): Payer: Self-pay | Admitting: Primary Care

## 2021-04-10 LAB — RPR: RPR Ser Ql: NONREACTIVE

## 2021-04-10 LAB — HIV ANTIBODY (ROUTINE TESTING W REFLEX): HIV Screen 4th Generation wRfx: NONREACTIVE

## 2021-04-10 NOTE — Progress Notes (Signed)
[facility] Patient name: Catherine [name] MRN [id number]  Date of birth: 10-Jul-1970 Chief Complaint:   Gynecologic Exam  History of Present Illness:   Catherine [name] is a 50 y.o. [id number] female being seen today for a routine well-woman exam.   CC:gyn   The current method of family planning is 2012 partial hysterectomy  No LMP recorded. Patient has had a hysterectomy. Last pap . Results were: normal Last mammogram: 08/08/20. Results were: normal. Family h/o breast cancer: No Last colonoscopy: referred made. . Family h/o colorectal cancer: No  Review of Systems:    Denies any headaches, blurred vision, fatigue, shortness of breath, chest pain, abdominal pain, abnormal vaginal discharge/itching/odor/irritation, problems with periods, bowel movements, urination, or intercourse unless otherwise stated above.  Pertinent History Reviewed:   Reviewed past medical,surgical, social and family history.  Reviewed problem list, medications and allergies.  Physical Assessment:   BP 128/85 (BP Location: Left Arm, Patient Position: Sitting, Cuff Size: Large)    Pulse 72    Temp (!) 97.5 F (36.4 C) (Temporal)    Wt (!) 310 lb 3.2 oz (140.7 kg)    BMI 48.58 kg/m   Body mass index is 48.58 kg/m.        Physical Examination:  General appearance - well appearing, morbid obese and in no distress Mental status - alert, oriented to person, place, and time Psych:  She has a normal mood and affect Skin - warm and dry, normal color, no suspicious lesions noted Chest - effort normal, all lung fields clear to auscultation bilaterally Heart - normal rate and regular rhythm Neck:  midline trachea, no thyromegaly or nodules Breasts - breasts appear normal, no suspicious masses, no skin or nipple changes or axillary nodes Educated patient on proper self breast examination and had patient to demonstrate SBE. Abdomen - soft, nontender, nondistended, no masses or  organomegaly Pelvic-VULVA: normal appearing vulva with no masses, tenderness or lesions   VAGINA: normal appearing vagina with normal color and discharge, no lesions   CERVIX: normal appearing cervix without discharge or lesions, no CMT UTERUS: uterus is felt to be normal size, shape, consistency and nontender  Extremities:  No swelling or varicosities noted  Results for orders placed or performed in visit on 04/09/21 (from the past 24 hour(s))  HgB A1c   Collection Time: 04/09/21  3:27 PM  Result Value Ref Range   Hemoglobin A1C 6.8 (A) 4.0 - 5.6 %   HbA1c POC (<> result, manual entry)     HbA1c, POC (prediabetic range)     HbA1c, POC (controlled diabetic range)    HIV antibody (with reflex)   Collection Time: 04/09/21  3:36 PM  Result Value Ref Range   HIV Screen 4th Generation wRfx Non Reactive Non Reactive  RPR   Collection Time: 04/09/21  3:36 PM  Result Value Ref Range   RPR Ser Ql Non Reactive Non Reactive     Assessment & Plan:   Catherine [name] was seen today for gynecologic exam.  Diagnoses and all orders for this visit:  Type 2 diabetes mellitus without complication, without long-term current use of insulin (HCC) -     HgB A1c 6.8 Controlled continue to monitor foods that are high in carbohydrates are the following rice, potatoes, breads, sugars, and pastas.  Reduction in the intake (eating) will assist in lowering your blood sugars.   Need for prophylactic vaccination against Streptococcus pneumoniae (pneumococcus) -     Pneumococcal  conjugate vaccine 20-valent (Prevnar 20)  Medication refill -     losartan (COZAAR) 50 MG tablet; Take 2 tablets (100 mg total) by mouth daily. -     hydrochlorothiazide (HYDRODIURIL) 25 MG tablet; TAKE 1 TABLET EVERY MORNING  Essential hypertension Counseled on blood pressure goal of less than 130/80, low-sodium, DASH diet, medication compliance, 150 minutes of moderate intensity exercise per week. Discussed medication compliance, adverse  effects.  -     losartan (COZAAR) 50 MG tablet; Take 2 tablets (100 mg total) by mouth daily. -     hydrochlorothiazide (HYDRODIURIL) 25 MG tablet; TAKE 1 TABLET EVERY MORNING  Encounter for Papanicolaou smear for cervical cancer screening -     Cytology - PAP([place])  Screening examination for STD (sexually transmitted disease) -     Cervicovaginal ancillary only -     RPR  Screening for HIV (human immunodeficiency virus) -     HIV antibody (with reflex)  Colon cancer screening -     Ambulatory referral to Gastroenterology  Labs/procedures today:  Cervicovaginal ancillary Cytology - [facility]  Mammogram 08/08/20   Orders Placed This Encounter  Procedures   Pneumococcal conjugate vaccine 20-valent (Prevnar 20)   HIV antibody (with reflex)   RPR   HgB A1c    Meds:  Meds ordered this encounter  Medications   losartan (COZAAR) 50 MG tablet    Sig: Take 2 tablets (100 mg total) by mouth daily.    Dispense:  180 tablet    Refill:  1    Please schedule office visit prior to further refills.   hydrochlorothiazide (HYDRODIURIL) 25 MG tablet    Sig: TAKE 1 TABLET EVERY MORNING    Dispense:  90 tablet    Refill:  1    Schedule office visit prior to further refills    Follow-up: Return in about 6 months (around 10/08/2021) for BP/A1C.  This note has been created with Surveyor, quantity. Any transcriptional errors are unintentional.   Kerin Perna, NP 04/10/2021, 1:27 PM

## 2021-04-11 LAB — CERVICOVAGINAL ANCILLARY ONLY
Bacterial Vaginitis (gardnerella): POSITIVE — AB
Candida Glabrata: NEGATIVE
Candida Vaginitis: NEGATIVE
Chlamydia: NEGATIVE
Comment: NEGATIVE
Comment: NEGATIVE
Comment: NEGATIVE
Comment: NEGATIVE
Comment: NEGATIVE
Comment: NORMAL
Neisseria Gonorrhea: NEGATIVE
Trichomonas: POSITIVE — AB

## 2021-04-15 ENCOUNTER — Telehealth (INDEPENDENT_AMBULATORY_CARE_PROVIDER_SITE_OTHER): Payer: Self-pay | Admitting: Primary Care

## 2021-04-15 ENCOUNTER — Ambulatory Visit (INDEPENDENT_AMBULATORY_CARE_PROVIDER_SITE_OTHER): Payer: Self-pay

## 2021-04-15 ENCOUNTER — Other Ambulatory Visit (INDEPENDENT_AMBULATORY_CARE_PROVIDER_SITE_OTHER): Payer: Self-pay | Admitting: Primary Care

## 2021-04-15 DIAGNOSIS — B75 Trichinellosis: Secondary | ICD-10-CM

## 2021-04-15 LAB — CYTOLOGY - PAP
Adequacy: ABSENT
Diagnosis: NEGATIVE

## 2021-04-15 MED ORDER — METRONIDAZOLE 500 MG PO TABS: 500.0000 mg | ORAL_TABLET | Freq: Two times a day (BID) | ORAL | 0 refills | Status: DC

## 2021-04-15 NOTE — Telephone Encounter (Signed)
Patient aware that medication has been sent for treatment.

## 2021-04-15 NOTE — Telephone Encounter (Signed)
Chief Complaint: Patient saw PAP result and asked what medication will she be on for trichomonas Symptoms: N/A Frequency: N/A Pertinent Negatives: Patient denies N/A Disposition: [] ED /[] Urgent Care (no appt availability in office) / [] Appointment(In office/virtual)/ []   Virtual Care/ [x] Home Care/ [] Refused Recommended Disposition  Additional Notes: Advised once Sharyn Lull reviews labs someone will call with her recommendation.    Summary: medication request   Patient called in says was dx with trichimonas Wednesday of last week and she was asking what medication will she be given. Please call back      Reason for Disposition  [1] Caller requesting NON-URGENT health information AND [2] PCP's office is the best resource  Answer Assessment - Initial Assessment Questions 1. REASON FOR CALL or QUESTION: "What is your reason for calling today?" or "How can I best help you?" or "What question do you have that I can help answer?"     Wanting to know what medication I need to take for trichamonas  Protocols used: Information Only Call - No Triage-A-AH

## 2021-04-16 DIAGNOSIS — M955 Acquired deformity of pelvis: Secondary | ICD-10-CM | POA: Diagnosis not present

## 2021-04-16 DIAGNOSIS — M47819 Spondylosis without myelopathy or radiculopathy, site unspecified: Secondary | ICD-10-CM | POA: Diagnosis not present

## 2021-04-18 DIAGNOSIS — Z79899 Other long term (current) drug therapy: Secondary | ICD-10-CM | POA: Diagnosis not present

## 2021-04-18 DIAGNOSIS — Z91040 Latex allergy status: Secondary | ICD-10-CM | POA: Insufficient documentation

## 2021-04-18 DIAGNOSIS — I10 Essential (primary) hypertension: Secondary | ICD-10-CM | POA: Insufficient documentation

## 2021-04-18 DIAGNOSIS — U071 COVID-19: Secondary | ICD-10-CM | POA: Insufficient documentation

## 2021-04-18 DIAGNOSIS — M549 Dorsalgia, unspecified: Secondary | ICD-10-CM | POA: Insufficient documentation

## 2021-04-18 DIAGNOSIS — E119 Type 2 diabetes mellitus without complications: Secondary | ICD-10-CM | POA: Diagnosis not present

## 2021-04-18 DIAGNOSIS — Z87891 Personal history of nicotine dependence: Secondary | ICD-10-CM | POA: Insufficient documentation

## 2021-04-18 DIAGNOSIS — J189 Pneumonia, unspecified organism: Secondary | ICD-10-CM | POA: Diagnosis not present

## 2021-04-18 DIAGNOSIS — J029 Acute pharyngitis, unspecified: Secondary | ICD-10-CM | POA: Diagnosis present

## 2021-04-18 DIAGNOSIS — R059 Cough, unspecified: Secondary | ICD-10-CM | POA: Diagnosis not present

## 2021-04-19 ENCOUNTER — Other Ambulatory Visit: Payer: Self-pay

## 2021-04-19 ENCOUNTER — Emergency Department (HOSPITAL_COMMUNITY): Payer: No Typology Code available for payment source

## 2021-04-19 ENCOUNTER — Emergency Department (HOSPITAL_COMMUNITY)
Admission: EM | Admit: 2021-04-19 | Discharge: 2021-04-19 | Disposition: A | Discharge status: Home / Self Care | Payer: No Typology Code available for payment source | Attending: Emergency Medicine | Admitting: Emergency Medicine

## 2021-04-19 DIAGNOSIS — R059 Cough, unspecified: Secondary | ICD-10-CM | POA: Diagnosis not present

## 2021-04-19 DIAGNOSIS — J189 Pneumonia, unspecified organism: Secondary | ICD-10-CM | POA: Diagnosis not present

## 2021-04-19 DIAGNOSIS — U071 COVID-19: Secondary | ICD-10-CM

## 2021-04-19 LAB — RESP PANEL BY RT-PCR (FLU A&B, COVID) ARPGX2
Influenza A by PCR: NEGATIVE
Influenza B by PCR: NEGATIVE
SARS Coronavirus 2 by RT PCR: POSITIVE — AB

## 2021-04-19 LAB — GROUP A STREP BY PCR: Group A Strep by PCR: NOT DETECTED

## 2021-04-19 MED ORDER — ACETAMINOPHEN 500 MG PO TABS
1000.0000 mg | ORAL_TABLET | Freq: Once | ORAL | Status: AC
  Administered 2021-04-19: 1000 mg via ORAL
  Filled 2021-04-19: qty 2

## 2021-04-19 MED ORDER — METHOCARBAMOL 500 MG PO TABS
500.0000 mg | ORAL_TABLET | Freq: Once | ORAL | Status: AC
  Administered 2021-04-19: 500 mg via ORAL
  Filled 2021-04-19: qty 1

## 2021-04-19 MED ORDER — BENZONATATE 100 MG PO CAPS: 100.0000 mg | ORAL_CAPSULE | Freq: Three times a day (TID) | ORAL | 0 refills | Status: DC

## 2021-04-19 MED ORDER — NIRMATRELVIR/RITONAVIR (PAXLOVID)TABLET: 3.0000 | ORAL_TABLET | Freq: Two times a day (BID) | ORAL | 0 refills | Status: AC

## 2021-04-19 NOTE — Discharge Instructions (Signed)
Take the prescribed medication as directed.  You will need to quarantine at home for the next 5 days. Follow-up with your primary care doctor. Return to the ED for new or worsening symptoms.

## 2021-04-19 NOTE — ED Triage Notes (Signed)
Patient reports she developed sore throat two days ago, now back is hurting from shoulders to hips. Has been coughing as well. Pain rated in back 5/10

## 2021-04-19 NOTE — ED Provider Notes (Signed)
[facility] DEPT Provider Note   CSN: [id number] Arrival date & time: 04/18/21  2356     History Chief Complaint  Patient presents with   Back Pain    Catherine [name] is a 50 y.o. female.  The history is provided by the patient and medical records.  Back Pain  50 y.o. F with hx of allergies, anxiety, depression, HLP, HTN, presenting to the ED for URI symptoms.  States "I feel like I have been hit by a bus".  Symptoms began Wednesday with sore throat and nasal congestion, now has dry, hacking cough, continued sinus pressure, but is experiencing body aches as well.  Pain is mostly in her back.  She has not had any numbness or weakness of the legs.  No bowel or bladder incontinence.  She denies any sick contacts or known COVID exposures.  Past Medical History:  Diagnosis Date   Allergy    seasonal allergies   Anemia    hx of   Anxiety    on meds   Blood transfusion without reported diagnosis    x 3   Bronchitis    Depression    on meds   Diabetes mellitus without complication ([place])    on meds   Hyperlipidemia    on meds   Hypertension    on meds   Sleep apnea    no CPAP machine being used at this time (08/19/2020)    Patient Active Problem List   Diagnosis Date Noted   Colon cancer screening    Adenomatous polyp of sigmoid colon    Generalized anxiety disorder 05/09/2018   Prediabetes 11/09/2017   Depression with anxiety 08/24/2017   Hypertension 07/29/2016   Obesity with serious comorbidity 07/29/2016    Past Surgical History:  Procedure Laterality Date   BUNIONECTOMY  2012   CESAREAN SECTION     X 2   COLONOSCOPY WITH PROPOFOL N/A 01/23/2021   Procedure: COLONOSCOPY WITH PROPOFOL;  Surgeon: Jackquline Denmark, MD;  Location: [facility]COPY;  Service: Endoscopy;  Laterality: N/A;   LAPAROSCOPIC VAGINAL HYSTERECTOMY  2007   pt still has tubes and ovaries   LAVH  2009   Leiomyoma and menorrhagia   MYOMECTOMY  2005   POLYPECTOMY   01/23/2021   Procedure: POLYPECTOMY;  Surgeon: Jackquline Denmark, MD;  Location: [facility];  Service: Endoscopy;;   WISDOM TOOTH EXTRACTION       OB History     Gravida  4   Para  2   Term      Preterm      AB  2   Living  2      SAB  2   IAB      Ectopic      Multiple      Live Births              Family History  Problem Relation Age of Onset   Hypertension Mother    Asthma Mother    Colon polyps Mother    Hypertension Father    Heart attack Father        died at 65 of an MI   Hypertension Sister    Asthma Daughter    Colon cancer Neg Hx    Esophageal cancer Neg Hx    Stomach cancer Neg Hx    Rectal cancer Neg Hx     Social History   Tobacco Use   Smoking status: Former    Packs/day: 0.50  Types: Cigarettes   Smokeless tobacco: Never   Tobacco comments:    3 months no cigarettes  Vaping Use   Vaping Use: Never used  Substance Use Topics   Alcohol use: Yes    Comment: 1-2 times per month   Drug use: No    Home Medications Prior to Admission medications   Medication Sig Start Date End Date Taking? Authorizing Provider  benzonatate (TESSALON) 100 MG capsule Take 1 capsule (100 mg total) by mouth every 8 (eight) hours. 04/19/21  Yes Garlon Hatchet, PA-C  nirmatrelvir/ritonavir EUA (PAXLOVID) 20 x 150 MG & 10 x 100MG  TABS Take 3 tablets by mouth 2 (two) times daily for 5 days. Patient GFR is 79. Take nirmatrelvir (150 mg) two tablets twice daily for 5 days and ritonavir (100 mg) one tablet twice daily for 5 days. 04/19/21 04/24/21 Yes 06/22/21, PA-C  acetaminophen (TYLENOL) 500 MG tablet Take 1,000 mg by mouth every 6 (six) hours as needed for moderate pain or headache.    [provider]  albuterol (VENTOLIN HFA) 108 (90 Base) MCG/ACT inhaler INHALE 2 PUFFS INTO THE LUNGS 4 TIMES A DAY 03/27/21   14/8/22, NP  busPIRone (BUSPAR) 7.5 MG tablet Take 1 tablet (7.5 mg total) by mouth 3 (three) times daily. 01/16/21    01/18/21, NP  fluticasone (FLONASE) 50 MCG/ACT nasal spray Place 1 spray into both nostrils daily as needed for allergies. 03/31/21   14/12/22, NP  hydrochlorothiazide (HYDRODIURIL) 25 MG tablet TAKE 1 TABLET EVERY MORNING 04/09/21   04/11/21, NP  losartan (COZAAR) 50 MG tablet Take 2 tablets (100 mg total) by mouth daily. 04/09/21   04/11/21, NP  metroNIDAZOLE (FLAGYL) 500 MG tablet Take 1 tablet (500 mg total) by mouth 2 (two) times daily. 04/15/21   04/17/21, NP  Sodium Sulfate-Mag Sulfate-KCl (SUTAB) [phone] MG TABS Take 1 kit by mouth as directed. MANUFACTURER CODES!! BIN: [id number] PCN: CN GROUP: [id number] MEMBER ID: [id number] AS SECONDARY INSURANCE ;NO PRIOR AUTHORIZATION 01/14/21   01/16/21, MD    Allergies    Metformin and related and Latex  Review of Systems   Review of Systems  HENT:  Positive for congestion.   Respiratory:  Positive for cough.   Musculoskeletal:  Positive for back pain.  All other systems reviewed and are negative.  Physical Exam Updated Vital Signs BP (!) 143/99    Pulse 78    Temp 98.3 F (36.8 C)    Resp 16    Ht 5\' 7"  (1.702 m)    Wt (!) 140.6 kg    SpO2 98%    BMI 48.55 kg/m   Physical Exam Vitals and nursing note reviewed.  Constitutional:      Appearance: She is well-developed.  HENT:     Head: Normocephalic and atraumatic.  Eyes:     Conjunctiva/sclera: Conjunctivae normal.     Pupils: Pupils are equal, round, and reactive to light.  Cardiovascular:     Rate and Rhythm: Normal rate and regular rhythm.     Heart sounds: Normal heart sounds.  Pulmonary:     Effort: Pulmonary effort is normal. No respiratory distress.     Breath sounds: Normal breath sounds. No rhonchi.  Abdominal:     General: Bowel sounds are normal.     Palpations: Abdomen is soft.     Tenderness: There is no abdominal tenderness. There is no rebound.  Musculoskeletal:  General: Normal range of  motion.     Cervical back: Normal range of motion.  Skin:    General: Skin is warm and dry.  Neurological:     Mental Status: She is alert and oriented to person, place, and time.    ED Results / Procedures / Treatments   Labs (all labs ordered are listed, but only abnormal results are displayed) Labs Reviewed  RESP PANEL BY RT-PCR (FLU A&B, COVID) ARPGX2 - Abnormal; Notable for the following components:      Result Value   SARS Coronavirus 2 by RT PCR POSITIVE (*)    All other components within normal limits  GROUP A STREP BY PCR    EKG None  Radiology DG Chest Port 1 View  Result Date: 04/19/2021 CLINICAL DATA:  Cough, back pain, COVID pneumonia EXAM: PORTABLE CHEST 1 VIEW COMPARISON:  07/20/2020 FINDINGS: Mild parenchymal scarring again noted. No superimposed confluent pulmonary infiltrate. No pneumothorax or pleural effusion. Cardiac size within normal limits. Pulmonary vascularity is normal. No acute bone abnormality. IMPRESSION: No radiographic evidence of acute cardiopulmonary disease. Electronically Signed   By: Fidela Salisbury M.D.   On: 04/19/2021 04:11    Procedures Procedures   Medications Ordered in ED Medications  methocarbamol (ROBAXIN) tablet 500 mg (500 mg Oral Given 04/19/21 0403)  acetaminophen (TYLENOL) tablet 1,000 mg (1,000 mg Oral Given 04/19/21 0403)    ED Course  I have reviewed the triage vital signs and the nursing notes.  Pertinent labs & imaging results that were available during my care of the patient were reviewed by me and considered in my medical decision making (see chart for details).    MDM Rules/Calculators/A&P                         50 year old female presenting to the ED with URI symptoms and back pain.  States "I feel like I have been hit by a bus".  Symptoms began 2 days ago, progressively worsening.  She is afebrile and nontoxic.  Does have dry cough and nasal congestion.  COVID screen is positive.  Chest x-ray was obtained which  is negative for any acute findings.  Back pain is without focal neurologic deficits and I suspect this is related to her COVID diagnosis.  We discussed risk versus benefits of paxlovid-- she wants to proceed with this.  Her SrCr has always been WNL, no history of renal disease.  Given home quarantine precautions.  Close follow-up with PCP.  Return here for new concerns.  Final Clinical Impression(s) / ED Diagnoses Final diagnoses:  [id number]    Rx / DC Orders ED Discharge Orders          Ordered    nirmatrelvir/ritonavir EUA (PAXLOVID) 20 x 150 MG & 10 x $Re'100MG'Qrt$  TABS  2 times daily        04/19/21 0414    benzonatate (TESSALON) 100 MG capsule  Every 8 hours        04/19/21 0414             Larene Pickett, PA-C 04/19/21 0630    Ripley Fraise, MD 04/19/21 [phone]

## 2021-05-16 NOTE — Progress Notes (Signed)
No show

## 2021-05-26 ENCOUNTER — Encounter (INDEPENDENT_AMBULATORY_CARE_PROVIDER_SITE_OTHER): Payer: Self-pay | Admitting: Primary Care

## 2021-05-27 ENCOUNTER — Encounter (INDEPENDENT_AMBULATORY_CARE_PROVIDER_SITE_OTHER): Payer: Self-pay | Admitting: Primary Care

## 2021-05-27 ENCOUNTER — Telehealth (INDEPENDENT_AMBULATORY_CARE_PROVIDER_SITE_OTHER): Payer: No Typology Code available for payment source | Admitting: Primary Care

## 2021-05-27 DIAGNOSIS — N898 Other specified noninflammatory disorders of vagina: Secondary | ICD-10-CM

## 2021-05-27 MED ORDER — FLUCONAZOLE 150 MG PO TABS: 150.0000 mg | ORAL_TABLET | Freq: Once | ORAL | 0 refills | Status: AC

## 2021-05-27 NOTE — Progress Notes (Signed)
[place]  Virtual Visit  I connected with Catherine [name], on 05/27/2021 at 4:57 PM through an audio and video application and verified that I am speaking with the correct person using two identifiers.   Consent: I discussed the limitations, risks, security and privacy concerns of performing an evaluation and management service by telephone and the availability of in person appointments. I also discussed with the patient that there may be a patient responsible charge related to this service. The patient expressed understanding and agreed to proceed.   Location of Patient: Home   Location of Provider: [facility]   Persons participating in Telemedicine visit: Catherine Blackie,  NP Catherine [name] , CMA  History of Present Illness: [name] is a 51 year old female . She went on a  weekend trip  and switched her soap dial. Since then she has no vaginal discharge irritation , itching and redness especially around the vulva . States she only uses DOVE abd this is why.   Past Medical History:  Diagnosis Date   Allergy    seasonal allergies   Anemia    hx of   Anxiety    on meds   Blood transfusion without reported diagnosis    x 3   Bronchitis    Depression    on meds   Diabetes mellitus without complication ([place])    on meds   Hyperlipidemia    on meds   Hypertension    on meds   Sleep apnea    no CPAP machine being used at this time (08/19/2020)   Allergies  Allergen Reactions   Metformin And Related Other (See Comments)    Abdominal discomfort.   Latex Rash    Current Outpatient Medications on File Prior to Visit  Medication Sig Dispense Refill   acetaminophen (TYLENOL) 500 MG tablet Take 1,000 mg by mouth every 6 (six) hours as needed for moderate pain or headache.     albuterol (VENTOLIN HFA) 108 (90 Base) MCG/ACT inhaler INHALE 2 PUFFS INTO THE LUNGS 4 TIMES A DAY 6.7 each  1   benzonatate (TESSALON) 100 MG capsule Take 1 capsule (100 mg total) by mouth every 8 (eight) hours. 21 capsule 0   busPIRone (BUSPAR) 7.5 MG tablet Take 1 tablet (7.5 mg total) by mouth 3 (three) times daily. 270 tablet 1   fluticasone (FLONASE) 50 MCG/ACT nasal spray Place 1 spray into both nostrils daily as needed for allergies. 11.1 mL 3   hydrochlorothiazide (HYDRODIURIL) 25 MG tablet TAKE 1 TABLET EVERY MORNING 90 tablet 1   losartan (COZAAR) 50 MG tablet Take 2 tablets (100 mg total) by mouth daily. 180 tablet 1   metroNIDAZOLE (FLAGYL) 500 MG tablet Take 1 tablet (500 mg total) by mouth 2 (two) times daily. 14 tablet 0   Sodium Sulfate-Mag Sulfate-KCl (SUTAB) [phone] MG TABS Take 1 kit by mouth as directed. MANUFACTURER CODES!! BIN: [id number] PCN: CN GROUP: [id number] MEMBER ID: [id number];BWG AS SECONDARY INSURANCE ;NO PRIOR AUTHORIZATION 24 tablet 0   No current facility-administered medications on file prior to visit.    Observations/Objective: Comprehensive ROS Pertinent positive and negative noted in HPI    Assessment and Plan: Diagnoses and all orders for this visit:  Vaginal irritation Instructed if medication does not clear infection call for an appt for a cervical ancillary visit. -     fluconazole (DIFLUCAN) 150 MG tablet; Take 1 tablet (150 mg  total) by mouth once for 1 dose.     Follow Up Instructions: 2 months DM or if problem not resolved   I discussed the assessment and treatment plan with the patient. The patient was provided an opportunity to ask questions and all were answered. The patient agreed with the plan and demonstrated an understanding of the instructions.   The patient was advised to call back or seek an in-person evaluation if the symptoms worsen or if the condition fails to improve as anticipated.     I provided 12 minutes total of -face-to-face time during this encounter including median intraservice time, reviewing previous notes,  investigations, ordering medications, medical decision making, coordinating care and patient verbalized understanding at the end of the visit.    This note has been created with Surveyor, quantity. Any transcriptional errors are unintentional.   Kerin Perna, NP 05/27/2021, 4:57 PM

## 2021-05-29 ENCOUNTER — Encounter (INDEPENDENT_AMBULATORY_CARE_PROVIDER_SITE_OTHER): Payer: Self-pay | Admitting: Primary Care

## 2021-06-25 ENCOUNTER — Other Ambulatory Visit: Payer: Self-pay | Admitting: Primary Care

## 2021-06-25 ENCOUNTER — Other Ambulatory Visit: Payer: Self-pay | Admitting: Physician Assistant

## 2021-06-25 DIAGNOSIS — Z1231 Encounter for screening mammogram for malignant neoplasm of breast: Secondary | ICD-10-CM

## 2021-07-24 ENCOUNTER — Ambulatory Visit (INDEPENDENT_AMBULATORY_CARE_PROVIDER_SITE_OTHER): Payer: Self-pay | Admitting: *Deleted

## 2021-07-24 ENCOUNTER — Encounter (INDEPENDENT_AMBULATORY_CARE_PROVIDER_SITE_OTHER): Payer: Self-pay | Admitting: Primary Care

## 2021-07-24 DIAGNOSIS — Z760 Encounter for issue of repeat prescription: Secondary | ICD-10-CM

## 2021-07-24 NOTE — Telephone Encounter (Signed)
?  Chief Complaint: SOB ?Symptoms: SOB- wheezing in throat ?Frequency: 1 month- getting worse ?Pertinent Negatives: Patient denies dizziness, runny nose, cough, chest pain, fever ?Disposition: '[]'$ ED /'[x]'$ Urgent Care (no appt availability in office) / '[]'$ Appointment(In office/virtual)/ '[]'$  [facility]/ '[]'$ Home Care/ '[]'$ Refused Recommended Disposition /'[]'$ [facility]/ '[]'$  Follow-up with PCP ?Additional Notes: Advised UC- no appointment available   ?

## 2021-07-24 NOTE — Telephone Encounter (Signed)
Reason for Disposition ? [1] Longstanding difficulty breathing AND [2] not responding to usual therapy ? ?Answer Assessment - Initial Assessment Questions ?1. RESPIRATORY STATUS: "Describe your breathing?" (e.g., wheezing, shortness of breath, unable to speak, severe coughing)  ?    Patient feels wheezing and rattling in throat, has trouble breathing at night- has to use inhaler ?2. ONSET: "When did this breathing problem begin?"  ?    Ongoing- worse for 1 month ?3. PATTERN "Does the difficult breathing come and go, or has it been constant since it started?"  ?    Comes and goes- worse with activity ?4. SEVERITY: "How bad is your breathing?" (e.g., mild, moderate, severe)  ?  - MILD: No SOB at rest, mild SOB with walking, speaks normally in sentences, can lie down, no retractions, pulse < 100.  ?  - MODERATE: SOB at rest, SOB with minimal exertion and prefers to sit, cannot lie down flat, speaks in phrases, mild retractions, audible wheezing, pulse 100-120.  ?  - SEVERE: Very SOB at rest, speaks in single words, struggling to breathe, sitting hunched forward, retractions, pulse > 120  ?    mild ?5. RECURRENT SYMPTOM: "Have you had difficulty breathing before?" If Yes, ask: "When was the last time?" and "What happened that time?"  ?    Yes- bronchitis, hx COVID, RSV, sarcoidosis  ?6. CARDIAC HISTORY: "Do you have any history of heart disease?" (e.g., heart attack, angina, bypass surgery, angioplasty)  ?     no ?7. LUNG HISTORY: "Do you have any history of lung disease?"  (e.g., pulmonary embolus, asthma, emphysema) ?    Hx bronchitis ?8. CAUSE: "What do you think is causing the breathing problem?"  ?    Patient is not sure- she thinks she may have long COVID ?9. OTHER SYMPTOMS: "Do you have any other symptoms? (e.g., dizziness, runny nose, cough, chest pain, fever) ?    no ?10. O2 SATURATION MONITOR:  "Do you use an oxygen saturation monitor (pulse oximeter) at home?" If Yes, "What is your reading (oxygen level)  today?" "What is your usual oxygen saturation reading?" (e.g., 95%) ?        ?11. PREGNANCY: "Is there any chance you are pregnant?" "When was your last menstrual period?" ?        ?12. TRAVEL: "Have you traveled out of the country in the last month?" (e.g., travel history, exposures) ?      no ? ?Protocols used: Breathing Difficulty-A-AH ? ?

## 2021-07-29 MED ORDER — ALBUTEROL SULFATE HFA 108 (90 BASE) MCG/ACT IN AERS: 1.0000 | INHALATION_SPRAY | Freq: Four times a day (QID) | RESPIRATORY_TRACT | 1 refills | Status: DC | PRN

## 2021-08-06 ENCOUNTER — Other Ambulatory Visit (INDEPENDENT_AMBULATORY_CARE_PROVIDER_SITE_OTHER): Payer: Self-pay | Admitting: Primary Care

## 2021-08-06 DIAGNOSIS — Z760 Encounter for issue of repeat prescription: Secondary | ICD-10-CM

## 2021-08-06 DIAGNOSIS — I10 Essential (primary) hypertension: Secondary | ICD-10-CM

## 2021-08-07 NOTE — Telephone Encounter (Signed)
Requested medications are due for refill today.  Was given a 6 months supply in December. ? ?Requested medications are on the active medications list.  yes ? ?Last refill. 04/09/2021 #180 1 refill ? ?Future visit scheduled.   yes ? ?Notes to clinic.  Labs are expired. Request is too soon. ? ? ? ?Requested Prescriptions  ?Pending Prescriptions Disp Refills  ? losartan (COZAAR) 50 MG tablet [Pharmacy Med Name: LOSARTAN POTASSIUM 50 MG TAB] 180 tablet 1  ?  Sig: TAKE 2 TABLETS BY MOUTH EVERY DAY  ?  ? Cardiovascular:  Angiotensin Receptor Blockers Failed - 08/06/2021  6:01 PM  ?  ?  Failed - Cr in normal range and within 180 days  ?  Creat  ?Date Value Ref Range Status  ?05/17/2014 0.81 0.50 - 1.10 mg/dL Final  ? ?Creatinine, Ser  ?Date Value Ref Range Status  ?01/16/2021 0.89 0.57 - 1.00 mg/dL Final  ?  ?  ?  ?  Failed - K in normal range and within 180 days  ?  Potassium  ?Date Value Ref Range Status  ?01/16/2021 4.4 3.5 - 5.2 mmol/L Final  ?  ?  ?  ?  Failed - Last BP in normal range  ?  BP Readings from Last 1 Encounters:  ?04/19/21 140/90  ?  ?  ?  ?  Passed - Patient is not pregnant  ?  ?  Passed - Valid encounter within last 6 months  ?  Recent Outpatient Visits   ? ?      ? 2 months ago Vaginal irritation  ? [facility] Kerin Perna, NP  ? 4 months ago Type 2 diabetes mellitus without complication, without long-term current use of insulin ([place])  ? [facility] Kerin Perna, NP  ? 4 months ago Erroneous encounter - disregard  ? [facility] Kerin Perna, NP  ? 6 months ago Type 2 diabetes mellitus without complication, without long-term current use of insulin ([place])  ? [facility] Kerin Perna, NP  ? 1 year ago Hospital discharge follow-up  ? [facility] Kerin Perna, NP  ? ?  ?  ?Future Appointments   ? ?        ? In 2 weeks Kerin Perna, NP [place]  ? In 2 months Oletta Lamas, Milford Cage, NP [place]  ? ?  ? ? ?  ?  ?  ?Signed Prescriptions Disp Refills  ? albuterol (VENTOLIN HFA) 108 (90 Base) MCG/ACT inhaler 6.7 each 1  ?  Sig: INHALE 2 PUFFS INTO THE LUNGS 4 TIMES A DAY  ?  ? Pulmonology:  Beta Agonists 2 Failed - 08/06/2021  6:01 PM  ?  ?  Failed - Last BP in normal range  ?  BP Readings from Last 1 Encounters:  ?04/19/21 140/90  ?  ?  ?  ?  Passed - Last Heart Rate in normal range  ?  Pulse Readings from Last 1 Encounters:  ?04/19/21 80  ?  ?  ?  ?  Passed - Valid encounter within last 12 months  ?  Recent Outpatient Visits   ? ?      ? 2 months ago Vaginal irritation  ? [facility] Kerin Perna, NP  ? 4 months ago Type 2 diabetes mellitus without complication, without long-term  current use of insulin ([place])  ? [facility] Kerin Perna, NP  ? 4 months ago Erroneous encounter - disregard  ? [facility] Kerin Perna, NP  ? 6 months ago Type 2 diabetes mellitus without complication, without long-term current use of insulin ([place])  ? [facility] Kerin Perna, NP  ? 1 year ago Hospital discharge follow-up  ? [facility] Kerin Perna, NP  ? ?  ?  ?Future Appointments   ? ?        ? In 2 weeks Kerin Perna, NP [place]  ? In 2 months Oletta Lamas, Milford Cage, NP [place]  ? ?  ? ? ?  ?  ?  ?  ?

## 2021-08-07 NOTE — Telephone Encounter (Signed)
Routed to PCP 

## 2021-08-07 NOTE — Telephone Encounter (Signed)
Refilled 07/29/2021 18 with 1 refill. ?Requested Prescriptions  ?Pending Prescriptions Disp Refills  ?? losartan (COZAAR) 50 MG tablet [Pharmacy Med Name: LOSARTAN POTASSIUM 50 MG TAB] 180 tablet 1  ?  Sig: TAKE 2 TABLETS BY MOUTH EVERY DAY  ?  ? Cardiovascular:  Angiotensin Receptor Blockers Failed - 08/06/2021  6:01 PM  ?  ?  Failed - Cr in normal range and within 180 days  ?  Creat  ?Date Value Ref Range Status  ?05/17/2014 0.81 0.50 - 1.10 mg/dL Final  ? ?Creatinine, Ser  ?Date Value Ref Range Status  ?01/16/2021 0.89 0.57 - 1.00 mg/dL Final  ?   ?  ?  Failed - K in normal range and within 180 days  ?  Potassium  ?Date Value Ref Range Status  ?01/16/2021 4.4 3.5 - 5.2 mmol/L Final  ?   ?  ?  Failed - Last BP in normal range  ?  BP Readings from Last 1 Encounters:  ?04/19/21 140/90  ?   ?  ?  Passed - Patient is not pregnant  ?  ?  Passed - Valid encounter within last 6 months  ?  Recent Outpatient Visits   ?      ? 2 months ago Vaginal irritation  ? [facility] Kerin Perna, NP  ? 4 months ago Type 2 diabetes mellitus without complication, without long-term current use of insulin ([place])  ? [facility] Kerin Perna, NP  ? 4 months ago Erroneous encounter - disregard  ? [facility] Kerin Perna, NP  ? 6 months ago Type 2 diabetes mellitus without complication, without long-term current use of insulin ([place])  ? [facility] Kerin Perna, NP  ? 1 year ago Hospital discharge follow-up  ? [facility] Kerin Perna, NP  ?  ?  ?Future Appointments   ?        ? In 2 weeks Kerin Perna, NP [place]  ? In 2 months Oletta Lamas, Milford Cage, NP [facility]  ?  ? ?  ?  ?  ?? albuterol (VENTOLIN HFA) 108 (90 Base) MCG/ACT inhaler [Pharmacy Med Name: ALBUTEROL HFA (PROVENTIL) INH] 6.7 each 1  ?  Sig: INHALE 2 PUFFS INTO THE LUNGS 4  TIMES A DAY  ?  ? Pulmonology:  Beta Agonists 2 Failed - 08/06/2021  6:01 PM  ?  ?  Failed - Last BP in normal range  ?  BP Readings from Last 1 Encounters:  ?04/19/21 140/90  ?   ?  ?  Passed - Last Heart Rate in normal range  ?  Pulse Readings from Last 1 Encounters:  ?04/19/21 80  ?   ?  ?  Passed - Valid encounter within last 12 months  ?  Recent Outpatient Visits   ?      ? 2 months ago Vaginal irritation  ? [facility] Kerin Perna, NP  ? 4 months ago Type 2 diabetes mellitus without complication, without long-term current use of insulin ([place])  ? [facility] Kerin Perna, NP  ? 4 months ago Erroneous encounter - disregard  ? [facility] Kerin Perna, NP  ? 6 months ago Type 2 diabetes mellitus without complication, without long-term current use of insulin ([place])  ? [place] [facility] Kerin P[facility], NP  ? 1 year ago Hospital discharge follow-up  ? [facility] Kerin Perna, NP  ?  ?  ?Future Appointments   ?        ? In 2 weeks Kerin Perna, NP [place]  ? In 2 months Oletta Lamas, Milford Cage, NP [place]  ?  ? ?  ?  ?  ? ?

## 2021-08-11 ENCOUNTER — Ambulatory Visit
Admission: RE | Admit: 2021-08-11 | Discharge: 2021-08-11 | Disposition: A | Discharge status: Home / Self Care | Payer: No Typology Code available for payment source | Source: Ambulatory Visit | Attending: Primary Care | Admitting: Primary Care

## 2021-08-11 DIAGNOSIS — Z1231 Encounter for screening mammogram for malignant neoplasm of breast: Secondary | ICD-10-CM

## 2021-08-25 ENCOUNTER — Encounter (INDEPENDENT_AMBULATORY_CARE_PROVIDER_SITE_OTHER): Payer: Self-pay | Admitting: Primary Care

## 2021-08-25 ENCOUNTER — Ambulatory Visit (INDEPENDENT_AMBULATORY_CARE_PROVIDER_SITE_OTHER): Payer: No Typology Code available for payment source | Admitting: Primary Care

## 2021-08-25 VITALS — BP 122/83 | HR 74 | Temp 98.3°F | Ht 67.0 in | Wt 313.2 lb

## 2021-08-25 DIAGNOSIS — I10 Essential (primary) hypertension: Secondary | ICD-10-CM

## 2021-08-25 DIAGNOSIS — Z760 Encounter for issue of repeat prescription: Secondary | ICD-10-CM | POA: Diagnosis not present

## 2021-08-25 DIAGNOSIS — Z8709 Personal history of other diseases of the respiratory system: Secondary | ICD-10-CM | POA: Diagnosis not present

## 2021-08-25 DIAGNOSIS — E119 Type 2 diabetes mellitus without complications: Secondary | ICD-10-CM | POA: Diagnosis not present

## 2021-08-25 DIAGNOSIS — R69 Illness, unspecified: Secondary | ICD-10-CM | POA: Diagnosis not present

## 2021-08-25 DIAGNOSIS — F411 Generalized anxiety disorder: Secondary | ICD-10-CM

## 2021-08-25 MED ORDER — BUSPIRONE HCL 7.5 MG PO TABS: 7.5000 mg | ORAL_TABLET | Freq: Three times a day (TID) | ORAL | 1 refills | Status: DC

## 2021-08-25 MED ORDER — TRULICITY 0.75 MG/0.5ML ~~LOC~~ SOAJ: 0.7500 mg | SUBCUTANEOUS | 3 refills | Status: DC

## 2021-08-25 MED ORDER — ALBUTEROL SULFATE HFA 108 (90 BASE) MCG/ACT IN AERS: INHALATION_SPRAY | RESPIRATORY_TRACT | 1 refills | Status: DC

## 2021-08-25 MED ORDER — HYDROCHLOROTHIAZIDE 25 MG PO TABS: ORAL_TABLET | ORAL | 1 refills | Status: DC

## 2021-08-25 MED ORDER — FLUTICASONE PROPIONATE HFA 44 MCG/ACT IN AERO: 2.0000 | INHALATION_SPRAY | Freq: Two times a day (BID) | RESPIRATORY_TRACT | 12 refills | Status: DC

## 2021-08-25 NOTE — Progress Notes (Signed)
?[place] ? ?Catherine [name], is a 51 y.o. female ? ?[id number] ? ?[id number] ? ?DOB - Nov 29, 1970 ? ?Chief Complaint  ?Patient presents with  ? Shortness of Breath  ?  More frequently causing her to have to use her inhaler more often   ?    ? ?Subjective:  ? ?Catherine [name] is a 51 y.o. female here today for a congestion and wheezing.  Patient has No headache, No chest pain, No abdominal pain - No Nausea, No new weakness tingling or numbness, No Cough - shortness of breath ? ?No problems updated. ? ?Allergies  ?Allergen Reactions  ? Metformin And Related Other (See Comments)  ?  Abdominal discomfort.  ? Latex Rash  ? ? ?Past Medical History:  ?Diagnosis Date  ? Allergy   ? seasonal allergies  ? Anemia   ? hx of  ? Anxiety   ? on meds  ? Blood transfusion without reported diagnosis   ? x 3  ? Bronchitis   ? Depression   ? on meds  ? Diabetes mellitus without complication ([place])   ? on meds  ? Hyperlipidemia   ? on meds  ? Hypertension   ? on meds  ? Sleep apnea   ? no CPAP machine being used at this time (08/19/2020)  ? ? ?Current Outpatient Medications on File Prior to Visit  ?Medication Sig Dispense Refill  ? losartan (COZAAR) 50 MG tablet TAKE 2 TABLETS BY MOUTH EVERY DAY 180 tablet 1  ? acetaminophen (TYLENOL) 500 MG tablet Take 1,000 mg by mouth every 6 (six) hours as needed for moderate pain or headache.    ? fluticasone (FLONASE) 50 MCG/ACT nasal spray Place 1 spray into both nostrils daily as needed for allergies. 11.1 mL 3  ? Sodium Sulfate-Mag Sulfate-KCl (SUTAB) [phone] MG TABS Take 1 kit by mouth as directed. MANUFACTURER CODES!! BIN: [id number] PCN: CN GROUP: [id number] MEMBER ID: [id number];CVE AS SECONDARY INSURANCE ;NO PRIOR AUTHORIZATION 24 tablet 0  ? ?No current facility-administered medications on file prior to visit.  ? ? ?Objective:  ? ?Vitals:  ? 08/25/21 1532  ?BP: 122/83  ?Pulse: 74  ?Temp: 98.3 ?F (36.8 ?C)  ?TempSrc: Oral  ?SpO2: 93%  ?Weight: (!) 313 lb 3.2 oz (142.1 kg)   ?Height: $RemoveB'5\' 7"'rgSRwZZe$  (1.702 m)  ? ? ?Exam ?General appearance : Awake, alert, not in any distress. Speech Clear. Not toxic looking ?HEENT: Atraumatic and Normocephalic, pupils equally reactive to light and accomodation ?Neck: Supple, no JVD. No cervical lymphadenopathy.  ?Chest: Good air entry bilaterally, no added sounds  ?CVS: S1 S2 regular, no murmurs.  ?Abdomen: Bowel sounds present, Non tender and not distended with no gaurding, rigidity or rebound. ?Extremities: B/L Lower Ext shows no edema, both legs are warm to touch ?Neurology: Awake alert, and oriented X 3, CN II-XII intact, Non focal ?Skin: No Rash ? ?Data Review ?Lab Results  ?Component Value Date  ? HGBA1C 6.8 (A) 04/09/2021  ? HGBA1C 6.7 (A) 01/16/2021  ? HGBA1C 6.8 (A) 02/21/2020  ? ? ?Assessment & Plan  ? ?1. Medication refill ? ?- albuterol (VENTOLIN HFA) 108 (90 Base) MCG/ACT inhaler; INHALE 2 PUFFS INTO THE LUNGS 4 TIMES A DAY  Dispense: 18 each; Refill: 1 ?- fluticasone (FLOVENT HFA) 44 MCG/ACT inhaler; Inhale 2 puffs into the lungs 2 (two) times daily.  Dispense: 1 each; Refill: 12 ? ?2. Type 2 diabetes mellitus without complication, without long-term current use of insulin ([place]) ? ?- Dulaglutide (TRULICITY) 9.38 BO/1.7PZ SOPN;  Inject 0.75 mg into the skin once a week.  Dispense: 0.5 mL; Refill: 3 ?- Lipid Panel ?- CBC with Differential ? ?3. History of acute bronchitis with bronchospasm ? ?- albuterol (VENTOLIN HFA) 108 (90 Base) MCG/ACT inhaler; INHALE 2 PUFFS INTO THE LUNGS 4 TIMES A DAY  Dispense: 18 each; Refill: 1 ?- fluticasone (FLOVENT HFA) 44 MCG/ACT inhaler; Inhale 2 puffs into the lungs 2 (two) times daily.  Dispense: 1 each; Refill: 12 ? ?4. Essential hypertension ?Blood pressure goal is at goal less than 130/80, low-sodium, DASH diet, medication compliance, 150 minutes of moderate intensity exercise per week. ?Discussed medication compliance, adverse effects.  ?- CMP14+EGFR ? ?Patient have been counseled extensively about nutrition and  exercise. Other issues discussed during this visit include: low cholesterol diet, weight control and daily exercise, foot care, annual eye examinations at Ophthalmology, importance of adherence with medications and regular follow-up. We also discussed long term complications of uncontrolled diabetes and hypertension.  ? ?Return in about 3 months (around 11/25/2021) for DM. ? ?The patient was given clear instructions to go to ER or return to medical center if symptoms don't improve, worsen or new problems develop. The patient verbalized understanding. The patient was told to call to get lab results if they haven't heard anything in the next week.  ? ?This note has been created with Surveyor, quantity. Any transcriptional errors are unintentional.  ? ?Kerin Perna, NP ?08/27/2021, 10:31 PM  ?

## 2021-08-27 ENCOUNTER — Other Ambulatory Visit (INDEPENDENT_AMBULATORY_CARE_PROVIDER_SITE_OTHER): Payer: Self-pay | Admitting: Primary Care

## 2021-08-27 LAB — CBC WITH DIFFERENTIAL/PLATELET
Basophils Absolute: 0.1 10*3/uL (ref 0.0–0.2)
Basos: 1 %
EOS (ABSOLUTE): 0.6 10*3/uL — ABNORMAL HIGH (ref 0.0–0.4)
Eos: 7 %
Hematocrit: 44.2 % (ref 34.0–46.6)
Hemoglobin: 13.8 g/dL (ref 11.1–15.9)
Immature Grans (Abs): 0 10*3/uL (ref 0.0–0.1)
Immature Granulocytes: 1 %
Lymphocytes Absolute: 5.2 10*3/uL — ABNORMAL HIGH (ref 0.7–3.1)
Lymphs: 59 %
MCH: 29.6 pg (ref 26.6–33.0)
MCHC: 31.2 g/dL — ABNORMAL LOW (ref 31.5–35.7)
MCV: 95 fL (ref 79–97)
Monocytes Absolute: 0.4 10*3/uL (ref 0.1–0.9)
Monocytes: 5 %
Neutrophils Absolute: 2.4 10*3/uL (ref 1.4–7.0)
Neutrophils: 27 %
Platelets: 265 10*3/uL (ref 150–450)
RBC: 4.67 x10E6/uL (ref 3.77–5.28)
RDW: 13.2 % (ref 11.7–15.4)
WBC: 8.7 10*3/uL (ref 3.4–10.8)

## 2021-08-27 LAB — CMP14+EGFR
ALT: 17 IU/L (ref 0–32)
AST: 18 IU/L (ref 0–40)
Albumin/Globulin Ratio: 1.5 (ref 1.2–2.2)
Albumin: 4.6 g/dL (ref 3.8–4.9)
Alkaline Phosphatase: 86 IU/L (ref 44–121)
BUN/Creatinine Ratio: 16 (ref 9–23)
BUN: 13 mg/dL (ref 6–24)
Bilirubin Total: 0.5 mg/dL (ref 0.0–1.2)
CO2: 28 mmol/L (ref 20–29)
Calcium: 9.9 mg/dL (ref 8.7–10.2)
Chloride: 98 mmol/L (ref 96–106)
Creatinine, Ser: 0.83 mg/dL (ref 0.57–1.00)
Globulin, Total: 3 g/dL (ref 1.5–4.5)
Glucose: 88 mg/dL (ref 70–99)
Potassium: 4.1 mmol/L (ref 3.5–5.2)
Sodium: 140 mmol/L (ref 134–144)
Total Protein: 7.6 g/dL (ref 6.0–8.5)
eGFR: 85 mL/min/{1.73_m2} (ref >59)

## 2021-08-27 LAB — LIPID PANEL
Chol/HDL Ratio: 3.5 ratio (ref 0.0–4.4)
Cholesterol, Total: 201 mg/dL — ABNORMAL HIGH (ref 100–199)
HDL: 58 mg/dL (ref >39)
LDL Chol Calc (NIH): 124 mg/dL — ABNORMAL HIGH (ref 0–99)
Triglycerides: 104 mg/dL (ref 0–149)
VLDL Cholesterol Cal: 19 mg/dL (ref 5–40)

## 2021-08-27 MED ORDER — ROSUVASTATIN CALCIUM 10 MG PO TABS: 10.0000 mg | ORAL_TABLET | Freq: Every day | ORAL | 1 refills | Status: DC

## 2021-08-29 ENCOUNTER — Telehealth: Payer: Self-pay

## 2021-08-29 NOTE — Telephone Encounter (Signed)
PA FOR TRULICITY APPROVED UNTIL 08/28/24. PHARMACY NOTIFIED ?

## 2021-09-19 ENCOUNTER — Encounter (INDEPENDENT_AMBULATORY_CARE_PROVIDER_SITE_OTHER): Payer: Self-pay | Admitting: Primary Care

## 2021-09-30 NOTE — Progress Notes (Signed)
S:    Catherine [name] is a 51 y.o. female who presents for diabetes evaluation, education, and management. PMH is significant for HTN, T2DM, obesity, depression/GAD. Patient was referred and last seen by Primary Care Provider, Juluis Mire, on 08/25/21. At last visit, Trulicity was started.   Today, patient arrives in good spirits and presents without any assistance. She tells me today that she is tolerating Trulicity well. She denies any current NV, abdominal pain. She did experience NV with the Trulicity initially. She attributes this mostly to eating past the point of satiety. As she is decreasing her portions, this has resolved.   Patient with longstanding hx of preDM. She is now T2DM. She denies any hx of pancreatitis or thyroid cancer. No personal hx of ASCVD, CHF, or CKD.   Family/Social History:  -Fhx: HTN, MI, asthma  -Tobacco: former smoker  -Alcohol: none reported   Current diabetes medications include: Trulicity 2.63 mg weekly Current hypertension medications include: losartan 100 mg daily, HCTZ 25 mg daily Current hyperlipidemia medications include: rosuvastatin 10 mg daily  Patient reports taking all medications as prescribed. Patient reports missing her medications 0 times per week, on average.  Do you feel that your medications are working for you? yes Have you been experiencing any side effects to the medications prescribed? Yes - summarized above. Do you have any problems obtaining medications due to transportation or finances? No  Insurance coverage: [employer]   Patient denies hypoglycemic events.  Reported home fasting blood sugars: none reported   Reported 2 hour post-meal/random blood sugars: none reported.  Patient denies nocturia (nighttime urination).  Patient denies neuropathy (nerve pain). Patient denies visual changes. Patient reports self foot exams.   Patient reported dietary habits:  -Eats smaller, multiple meals per day  -Gives examples of salads  from [place]   Patient-reported exercise habits: none reported   O:  Physical Exam  ROS  7 day average blood glucose: no meter with her   Lab Results  Component Value Date   HGBA1C 6.8 (A) 04/09/2021   There were no vitals filed for this visit.  Lipid Panel     Component Value Date/Time   CHOL 201 (H) 08/25/2021 1616   TRIG 104 08/25/2021 1616   HDL 58 08/25/2021 1616   CHOLHDL 3.5 08/25/2021 1616   CHOLHDL 3.4 05/16/2013 1625   VLDL 17 05/16/2013 1625   LDLCALC 124 (H) 08/25/2021 1616    Clinical Atherosclerotic Cardiovascular Disease (ASCVD): No  The 10-year ASCVD risk score (Arnett DK, et al., 2019) is: 13%   Values used to calculate the score:     Age: 53 years     Sex: Female     Is Non-Hispanic African American: Yes     Diabetic: Yes     Tobacco smoker: Yes     Systolic Blood Pressure: 785 mmHg     Is BP treated: Yes     HDL Cholesterol: 58 mg/dL     Total Cholesterol: 201 mg/dL    A/P: Diabetes longstanding currently controlled. Patient is able to verbalize appropriate hypoglycemia management plan. Medication adherence appears appropriate. Would like to titrate Trulicity for weight loss benefit and improved CV risk reduction. Pt is well-controlled from a DM standpoint. I worry that an increase in dose may cause GI side effects as she had these initially with the 0.75 dose. Emphasis placed on eating smaller meals.  -Increased Trulicity to 1.5 mg weekly.  -Patient educated on purpose, proper use, and potential adverse  effects of Trulicity.  -Extensively discussed pathophysiology of diabetes, recommended lifestyle interventions, dietary effects on blood sugar control.  -Counseled on s/sx of and management of hypoglycemia.  -Next A1c anticipated 09/2021.   Written patient instructions provided. Patient verbalized understanding of treatment plan. Total time in face to face counseling 30 minutes.    Follow up pharmacist clinic visit in 6 weeks.  Benard Halsted, PharmD, [place], [place] [phone]

## 2021-10-01 ENCOUNTER — Other Ambulatory Visit (HOSPITAL_COMMUNITY)
Admission: RE | Admit: 2021-10-01 | Discharge: 2021-10-01 | Disposition: A | Discharge status: Home / Self Care | Payer: 59 | Source: Ambulatory Visit | Attending: Primary Care | Admitting: Primary Care

## 2021-10-01 ENCOUNTER — Ambulatory Visit (INDEPENDENT_AMBULATORY_CARE_PROVIDER_SITE_OTHER): Payer: 59 | Admitting: Primary Care

## 2021-10-01 ENCOUNTER — Encounter (INDEPENDENT_AMBULATORY_CARE_PROVIDER_SITE_OTHER): Payer: Self-pay | Admitting: Primary Care

## 2021-10-01 VITALS — BP 130/89 | HR 92 | Temp 97.5°F | Ht 67.0 in | Wt 310.8 lb

## 2021-10-01 DIAGNOSIS — N760 Acute vaginitis: Secondary | ICD-10-CM | POA: Diagnosis not present

## 2021-10-01 DIAGNOSIS — N898 Other specified noninflammatory disorders of vagina: Secondary | ICD-10-CM | POA: Insufficient documentation

## 2021-10-01 DIAGNOSIS — E119 Type 2 diabetes mellitus without complications: Secondary | ICD-10-CM | POA: Diagnosis not present

## 2021-10-01 DIAGNOSIS — B9689 Other specified bacterial agents as the cause of diseases classified elsewhere: Secondary | ICD-10-CM | POA: Diagnosis not present

## 2021-10-01 LAB — POCT GLYCOSYLATED HEMOGLOBIN (HGB A1C): Hemoglobin A1C: 6.6 % — AB (ref 4.0–5.6)

## 2021-10-01 NOTE — Progress Notes (Signed)
Subjective:  Patient ID: Catherine [name], female    DOB: 20-Apr-1971  Age: 51 y.o. MRN: [id number]  CC: Diabetes and Blood Pressure Check   HPI Catherine [name] presents for follow-up of diabetes. Patient does not check blood sugar at home. Management of HTN - did not take Bp before visit . Patient has No headache, No chest pain, No abdominal pain - No Nausea, No new weakness tingling or numbness, No Cough - shortness of breath.   Compliant with meds - Yes Checking CBGs? No  Fasting avg -   Postprandial average -  Exercising regularly? - Yes Watching carbohydrate intake? - Yes Neuropathy ? - No Hypoglycemic events - No  - Recovers with :   Pertinent ROS:  Polyuria - Yes Polydipsia - Yes Vision problems - Yes  Medications as noted below. Taking them regularly without complication/adverse reaction being reported today.   History Catherine [name] has a past medical history of Allergy, Anemia, Anxiety, Blood transfusion without reported diagnosis, Bronchitis, Depression, Diabetes mellitus without complication ([place]), Hyperlipidemia, Hypertension, and Sleep apnea.   She has a past surgical history that includes Myomectomy (2005); Bunionectomy (2012); Cesarean section; LAVH (2009); Wisdom tooth extraction; Laparoscopic vaginal hysterectomy (2007); Colonoscopy with propofol (N/A, 01/23/2021); and polypectomy (01/23/2021).   Her family history includes Asthma in her daughter and mother; Colon polyps in her mother; Heart attack in her father; Hypertension in her father, mother, and sister.She reports that she has quit smoking. Her smoking use included cigarettes. She smoked an average of .5 packs per day. She has never used smokeless tobacco. She reports current alcohol use. She reports that she does not use drugs.  Current Outpatient Medications on File Prior to Visit  Medication Sig Dispense Refill   acetaminophen (TYLENOL) 500 MG tablet Take 1,000 mg by mouth every 6 (six) hours as needed for moderate pain  or headache.     albuterol (VENTOLIN HFA) 108 (90 Base) MCG/ACT inhaler INHALE 2 PUFFS INTO THE LUNGS 4 TIMES A DAY 18 each 1   busPIRone (BUSPAR) 7.5 MG tablet Take 1 tablet (7.5 mg total) by mouth 3 (three) times daily. 270 tablet 1   Dulaglutide (TRULICITY) 5.40 GQ/6.7YP SOPN Inject 0.75 mg into the skin once a week. 0.5 mL 3   fluticasone (FLONASE) 50 MCG/ACT nasal spray Place 1 spray into both nostrils daily as needed for allergies. 11.1 mL 3   fluticasone (FLOVENT HFA) 44 MCG/ACT inhaler Inhale 2 puffs into the lungs 2 (two) times daily. 1 each 12   hydrochlorothiazide (HYDRODIURIL) 25 MG tablet TAKE 1 TABLET EVERY MORNING 90 tablet 1   losartan (COZAAR) 50 MG tablet TAKE 2 TABLETS BY MOUTH EVERY DAY 180 tablet 1   rosuvastatin (CRESTOR) 10 MG tablet Take 1 tablet (10 mg total) by mouth daily. 90 tablet 1   Sodium Sulfate-Mag Sulfate-KCl (SUTAB) [phone] MG TABS Take 1 kit by mouth as directed. MANUFACTURER CODES!! BIN: [id number] PCN: CN GROUP: [id number] MEMBER ID: [id number];ALP AS SECONDARY INSURANCE ;NO PRIOR AUTHORIZATION 24 tablet 0   No current facility-administered medications on file prior to visit.    ROS Comprehensive ROS Pertinent positive and negative noted in HPI    Objective:  BP 130/89   Pulse 92   Temp (!) 97.5 F (36.4 C) (Oral)   Ht _0  (1.702 m)   Wt (!) 310 lb 12.8 oz (141 kg)   SpO2 95%   BMI 48.68 kg/m   BP Readings from Last 3 Encounters:  10/01/21 130/89  08/25/21 122/83  04/19/21 140/90    Wt Readings from Last 3 Encounters:  10/01/21 (!) 310 lb 12.8 oz (141 kg)  08/25/21 (!) 313 lb 3.2 oz (142.1 kg)  04/19/21 (!) 310 lb (140.6 kg)    Physical Exam Vitals reviewed.  Constitutional:      Appearance: She is obese.  HENT:     Head: Normocephalic.     Right Ear: External ear normal.     Left Ear: External ear normal.     Nose: Nose normal.  Eyes:     Extraocular Movements: Extraocular movements intact.  Cardiovascular:     Rate and  Rhythm: Normal rate and regular rhythm.  Pulmonary:     Effort: Pulmonary effort is normal.     Breath sounds: Normal breath sounds.  Abdominal:     General: Bowel sounds are normal. There is distension.     Palpations: Abdomen is soft.  Musculoskeletal:        General: Normal range of motion.     Cervical back: Normal range of motion and neck supple.  Skin:    General: Skin is warm and dry.  Neurological:     Mental Status: She is alert and oriented to person, place, and time.  Psychiatric:        Mood and Affect: Mood normal.        Behavior: Behavior normal.        Thought Content: Thought content normal.        Judgment: Judgment normal.    Lab Results  Component Value Date   HGBA1C 6.6 (A) 10/01/2021   HGBA1C 6.8 (A) 04/09/2021   HGBA1C 6.7 (A) 01/16/2021    Lab Results  Component Value Date   WBC 8.7 08/25/2021   HGB 13.8 08/25/2021   HCT 44.2 08/25/2021   PLT 265 08/25/2021   GLUCOSE 88 08/25/2021   CHOL 201 (H) 08/25/2021   TRIG 104 08/25/2021   HDL 58 08/25/2021   LDLCALC 124 (H) 08/25/2021   ALT 17 08/25/2021   AST 18 08/25/2021   NA 140 08/25/2021   K 4.1 08/25/2021   CL 98 08/25/2021   CREATININE 0.83 08/25/2021   BUN 13 08/25/2021   CO2 28 08/25/2021   TSH 1.440 01/16/2021   HGBA1C 6.6 (A) 10/01/2021     Assessment & Plan:  Catherine [name] was seen today for diabetes and blood pressure check.  Diagnoses and all orders for this visit:  Type 2 diabetes mellitus without complication, without long-term current use of insulin ([facility]) -     HgB A1c 6.6 down from 6.8 recently started on Trulicity. She is monitoring her foods that are high in carbohydrates are the following rice, potatoes, breads, sugars, and pastas.  Reduction in the intake (eating) will assist in lowering your blood sugars.   Vaginal irritation -     Cervicovaginal ancillary only    I am having Catherine [name] maintain her Sutab, acetaminophen, fluticasone, losartan, albuterol, fluticasone,  Trulicity, hydrochlorothiazide, busPIRone, and rosuvastatin.  No orders of the defined types were placed in this encounter.    Follow-up:   Return in about 3 months (around 01/01/2022) for DM.  The above assessment and management plan was discussed with the patient. The patient verbalized understanding of and has agreed to the management plan. Patient is aware to call the clinic if symptoms fail to improve or worsen. Patient is aware when to return to the clinic for a follow-up visit. Patient educated on when it is  appropriate to go to the emergency department.   Juluis Mire, NP-C

## 2021-10-03 ENCOUNTER — Ambulatory Visit (INDEPENDENT_AMBULATORY_CARE_PROVIDER_SITE_OTHER): Payer: Self-pay

## 2021-10-03 LAB — CERVICOVAGINAL ANCILLARY ONLY
Bacterial Vaginitis (gardnerella): POSITIVE — AB
Candida Glabrata: NEGATIVE
Candida Vaginitis: NEGATIVE
Chlamydia: NEGATIVE
Comment: NEGATIVE
Comment: NEGATIVE
Comment: NEGATIVE
Comment: NEGATIVE
Comment: NEGATIVE
Comment: NORMAL
Neisseria Gonorrhea: NEGATIVE
Trichomonas: NEGATIVE

## 2021-10-03 NOTE — Telephone Encounter (Signed)
Summary: personal discomfort / rx req   The patient has tested positive for an infection in their personal area (via swab)   The patient would like to be prescribed an antibiotic to help with their discomfort   The patient will be traveling and has stressed the urgency of their request   Please contact further when possible     Called pt  - LMOM

## 2021-10-03 NOTE — Telephone Encounter (Signed)
  Chief Complaint: Need medication for BV - diagnose via swab 10/01/2021 Symptoms: BV infection Frequency:  Pertinent Negatives: Patient denies  Disposition: '[]'$ ED /'[]'$ Urgent Care (no appt availability in office) / '[]'$ Appointment(In office/virtual)/ '[]'$  [facility]/ '[]'$ Home Care/ '[]'$ Refused Recommended Disposition /'[]'$ [facility]/ '[x]'$  Follow-up with PCP Additional Notes: Please call in medication for BV infection. Pt is leaving for vacation next week.   Summary: personal discomfort / rx req   The patient has tested positive for an infection in their personal area (via swab)   The patient would like to be prescribed an antibiotic to help with their discomfort   The patient will be traveling and has stressed the urgency of their request   Please contact further when possible       Reason for Disposition  [1] Prescription refill request for ESSENTIAL medicine (i.e., likelihood of harm to patient if not taken) AND [2] triager unable to refill per department policy  Answer Assessment - Initial Assessment Questions 1. DRUG NAME: "What medicine do you need to have refilled?"     Something for BV infection. 2. REFILLS REMAINING: "How many refills are remaining?" (Note: The label on the medicine or pill bottle will show how many refills are remaining. If there are no refills remaining, then a renewal may be needed.)      3. EXPIRATION DATE: "What is the expiration date?" (Note: The label states when the prescription will expire, and thus can no longer be refilled.)      4. PRESCRIBING HCP: "Who prescribed it?" Reason: If prescribed by specialists, call should be referred to that group.      5. SYMPTOMS: "Do you have any symptoms?"      6. PREGNANCY: "Is there any chance that you are pregnant?" "When was your last menstrual period?"  Protocols used: Medication Refill and Renewal Call-A-AH

## 2021-10-06 ENCOUNTER — Other Ambulatory Visit (INDEPENDENT_AMBULATORY_CARE_PROVIDER_SITE_OTHER): Payer: Self-pay | Admitting: Primary Care

## 2021-10-06 ENCOUNTER — Encounter: Payer: Self-pay | Admitting: Pharmacist

## 2021-10-06 ENCOUNTER — Ambulatory Visit: Payer: 59 | Attending: Family Medicine | Admitting: Pharmacist

## 2021-10-06 DIAGNOSIS — B9689 Other specified bacterial agents as the cause of diseases classified elsewhere: Secondary | ICD-10-CM

## 2021-10-06 DIAGNOSIS — E119 Type 2 diabetes mellitus without complications: Secondary | ICD-10-CM | POA: Diagnosis not present

## 2021-10-06 MED ORDER — TRULICITY 1.5 MG/0.5ML ~~LOC~~ SOAJ: 1.5000 mg | SUBCUTANEOUS | 3 refills | Status: DC

## 2021-10-06 MED ORDER — METRONIDAZOLE 500 MG PO TABS: 500.0000 mg | ORAL_TABLET | Freq: Two times a day (BID) | ORAL | 0 refills | Status: DC

## 2021-10-08 ENCOUNTER — Ambulatory Visit (INDEPENDENT_AMBULATORY_CARE_PROVIDER_SITE_OTHER): Payer: No Typology Code available for payment source | Admitting: Primary Care

## 2021-10-25 ENCOUNTER — Other Ambulatory Visit (INDEPENDENT_AMBULATORY_CARE_PROVIDER_SITE_OTHER): Payer: Self-pay | Admitting: Primary Care

## 2021-10-25 DIAGNOSIS — Z760 Encounter for issue of repeat prescription: Secondary | ICD-10-CM

## 2021-10-29 DIAGNOSIS — H52223 Regular astigmatism, bilateral: Secondary | ICD-10-CM | POA: Diagnosis not present

## 2021-10-29 DIAGNOSIS — H524 Presbyopia: Secondary | ICD-10-CM | POA: Diagnosis not present

## 2021-11-05 ENCOUNTER — Other Ambulatory Visit (INDEPENDENT_AMBULATORY_CARE_PROVIDER_SITE_OTHER): Payer: Self-pay | Admitting: Primary Care

## 2021-11-05 DIAGNOSIS — Z760 Encounter for issue of repeat prescription: Secondary | ICD-10-CM

## 2021-11-05 DIAGNOSIS — F411 Generalized anxiety disorder: Secondary | ICD-10-CM

## 2021-11-06 NOTE — Telephone Encounter (Signed)
Discontinued 04/07/2021. Med not on med list. Requested Prescriptions  Pending Prescriptions Disp Refills  . escitalopram (LEXAPRO) 20 MG tablet [Pharmacy Med Name: ESCITALOPRAM 20 MG TABLET] 90 tablet 1    Sig: TAKE 1 TABLET BY MOUTH EVERY DAY     Psychiatry:  Antidepressants - SSRI Passed - 11/05/2021  2:45 AM      Passed - Completed PHQ-2 or PHQ-9 in the last 360 days      Passed - Valid encounter within last 6 months    Recent Outpatient Visits          1 month ago Type 2 diabetes mellitus without complication, without long-term current use of insulin ([place])   [place], Stephen L, RPH-CPP   1 month ago Type 2 diabetes mellitus without complication, without long-term current use of insulin ([place])   [place] Juluis Mire P, NP   2 months ago Type 2 diabetes mellitus without complication, without long-term current use of insulin ([place])   [place] Kerin Perna, NP   5 months ago Vaginal irritation   [place] Kerin Perna, NP   7 months ago Type 2 diabetes mellitus without complication, without long-term current use of insulin ([place])   [place], Selma, NP      Future Appointments            In 1 week Daisy Blossom, Jarome Matin, [place]   In 1 month [place], Milford Cage, NP [place]

## 2021-11-18 ENCOUNTER — Ambulatory Visit: Payer: 59 | Admitting: Pharmacist

## 2021-11-20 ENCOUNTER — Ambulatory Visit (INDEPENDENT_AMBULATORY_CARE_PROVIDER_SITE_OTHER): Payer: Self-pay | Admitting: *Deleted

## 2021-11-20 NOTE — Telephone Encounter (Signed)
Summary: Medication Management   Pt stated that she took her last shot of Trulicity yesterday and the needle did not come out. No medication is showing on the valve.  Pt mentioned has an Rx of Trulicity ready for pickup but is unsure of what to do.    Pt seeking clinical advice.      Reason for Disposition  [1] Caller has URGENT medicine question about med that PCP or specialist prescribed AND [2] triager unable to answer question  Answer Assessment - Initial Assessment Questions 1. NAME of MEDICINE: "What medicine(s) are you calling about?"     Trulicity- last injection did not work 2. QUESTION: "What is your question?" (e.g., double dose of medicine, side effect)     Patient was due her Trulicity 3. PRESCRIBER: "Who prescribed the medicine?" Reason: if prescribed by specialist, call should be referred to that group.     PCP 4. SYMPTOMS: "Do you have any symptoms?" If Yes, ask: "What symptoms are you having?"  "How bad are the symptoms (e.g., mild, moderate, severe)     none Possible malfunction of injector- patient states no needle came out and she did not feel normal medication sensation in skin. Patient advised to return pen to pharmacy for malfunction. Patient wants to know if it is ok for her to hold off this week - because she doesn't want to possibly double dose.  She does have RF and will inject next scheduled dose.  Protocols used: Medication Question Call-A-AH

## 2021-11-21 ENCOUNTER — Ambulatory Visit: Payer: 59 | Attending: Physician Assistant | Admitting: Pharmacist

## 2021-11-21 VITALS — Wt 303.8 lb

## 2021-11-21 DIAGNOSIS — E119 Type 2 diabetes mellitus without complications: Secondary | ICD-10-CM

## 2021-11-21 NOTE — Progress Notes (Signed)
S:     No chief complaint on file.  Catherine [name] is a 51 y.o. female who presents for diabetes evaluation, education, and management. PMH is significant for HTN, GAD/depression, T2DM, and obesity.Patient was referred and last seen by Primary Care Provider, Juluis Mire, on 10/01/21. At last visit, Trulicity dose was increased.   Today, patient arrives in good spirits and presents without  any assistance. She endorses some nausea, but states it subsides about a day after her Trulicity injection. She has not been hungry for most meals lately. She will only eat if hungry, which ends up totaling 1-2 meals daily. She vocalized the desire to lose more weight, stating she wants to see "at least in the 200s."  Per chart, diabetes was diagnosed in 10/2018, with long-standing hx of preDM. No personal hx of ASCVD, CKD, or CHF.   Family/Social History:  - Tobacco: former smoker -Family: HTN, CAD, asthma  Current diabetes medications include: Trulicity mg SQ once a week and glimepiride 2 mg PO every morning  Current hypertension medications include:  HCTZ 25 mg PO once daily and Losartan 50 mg 2 tablets once daily  Current hyperlipidemia medications include: rosuvastatin 10 mg PO once daily (taking at night for cramping)  Patient reports adherence to taking all medications as prescribed.  Patient endorses adherence with medications, aside from glimepiride, which she stopped taking around May. At the time, she was not sure if the Trulicity was to replace her glimepiride.   Do you feel that your medications are working for you? yes Have you been experiencing any side effects to the medications prescribed? yes   Patient denies hypoglycemic events.  Patient denies neuropathy (nerve pain). Patient denies visual changes.   Patient reported dietary habits: Eats 1-2 meals/day - Typical meals look like baked chicken with vegetables or salad.  -She will eat chips periodically, typically sun  chips.  -She will eat sweets every once and a while. States she used to be able to eat an entire "king-sized package of reese" but can only eat one or two.    O:   ROS  Physical Exam  Does not take her BG at home.   Lab Results  Component Value Date   HGBA1C 6.6 (A) 10/01/2021   There were no vitals filed for this visit.  Lipid Panel     Component Value Date/Time   CHOL 201 (H) 08/25/2021 1616   TRIG 104 08/25/2021 1616   HDL 58 08/25/2021 1616   CHOLHDL 3.5 08/25/2021 1616   CHOLHDL 3.4 05/16/2013 1625   VLDL 17 05/16/2013 1625   LDLCALC 124 (H) 08/25/2021 1616    Clinical Atherosclerotic Cardiovascular Disease (ASCVD): No  The 10-year ASCVD risk score (Arnett DK, et al., 2019) is: 16.5%   Values used to calculate the score:     Age: 89 years     Sex: Female     Is Non-Hispanic African American: Yes     Diabetic: Yes     Tobacco smoker: Yes     Systolic Blood Pressure: 790 mmHg     Is BP treated: Yes     HDL Cholesterol: 58 mg/dL     Total Cholesterol: 201 mg/dL    A/P: Diabetes longstanding  currently controlled. Patient is able to verbalize appropriate hypoglycemia management plan. Medication adherence appears appropriate. Would like to continue current Trulicty dose as her appetite has been curbed. We discussed the option of switching to Ozempic for improved weight loss. She was  not interested at this time, but would like to consider at a future visit.  -Continue Tulicity 1.5 mg weekly. -Extensively discussed pathophysiology of diabetes, recommended lifestyle interventions, dietary effects on blood sugar control.  -Counseled on s/sx of and management of hypoglycemia.  -Next A1c anticipated at follow up in September with PCP.   ASCVD risk - primary prevention in patient with diabetes. Last LDL is 201 not at goal of <70 mg/dL. ASCVD risk factors include family history, HTN, diabetes, and 10-year ASCVD risk score of 16.5%. Continue current dose of rosuvastatin as  she has been complaining of some muscle pain/cramping. Recommended to continue to take at night to lessen cramping. -Continue rosuvastatin 10 mg once daily  Written patient instructions provided. Patient verbalized understanding of treatment plan.  Total time in face to face counseling 20 minutes.    Follow-up:  Pharmacist as needed. PCP clinic visit in 9/23.  Lind Guest, PharmD PGY-1 [facility] Pharmacy Resident 11/21/2021 2:55 PM

## 2021-11-27 ENCOUNTER — Ambulatory Visit (INDEPENDENT_AMBULATORY_CARE_PROVIDER_SITE_OTHER): Payer: No Typology Code available for payment source | Admitting: Primary Care

## 2021-12-15 ENCOUNTER — Other Ambulatory Visit (INDEPENDENT_AMBULATORY_CARE_PROVIDER_SITE_OTHER): Payer: Self-pay | Admitting: Primary Care

## 2022-01-02 ENCOUNTER — Encounter (INDEPENDENT_AMBULATORY_CARE_PROVIDER_SITE_OTHER): Payer: Self-pay | Admitting: Primary Care

## 2022-01-02 ENCOUNTER — Ambulatory Visit (INDEPENDENT_AMBULATORY_CARE_PROVIDER_SITE_OTHER): Payer: 59 | Admitting: Primary Care

## 2022-01-02 VITALS — BP 126/89 | HR 83 | Resp 16 | Wt 304.8 lb

## 2022-01-02 DIAGNOSIS — Z8709 Personal history of other diseases of the respiratory system: Secondary | ICD-10-CM

## 2022-01-02 DIAGNOSIS — Z0000 Encounter for general adult medical examination without abnormal findings: Secondary | ICD-10-CM | POA: Diagnosis not present

## 2022-01-02 DIAGNOSIS — R69 Illness, unspecified: Secondary | ICD-10-CM | POA: Diagnosis not present

## 2022-01-02 DIAGNOSIS — Z6841 Body Mass Index (BMI) 40.0 and over, adult: Secondary | ICD-10-CM

## 2022-01-02 DIAGNOSIS — I10 Essential (primary) hypertension: Secondary | ICD-10-CM

## 2022-01-02 DIAGNOSIS — F411 Generalized anxiety disorder: Secondary | ICD-10-CM

## 2022-01-02 DIAGNOSIS — Z760 Encounter for issue of repeat prescription: Secondary | ICD-10-CM | POA: Diagnosis not present

## 2022-01-02 DIAGNOSIS — Z1159 Encounter for screening for other viral diseases: Secondary | ICD-10-CM | POA: Diagnosis not present

## 2022-01-02 DIAGNOSIS — E119 Type 2 diabetes mellitus without complications: Secondary | ICD-10-CM | POA: Diagnosis not present

## 2022-01-02 LAB — POCT GLYCOSYLATED HEMOGLOBIN (HGB A1C): HbA1c, POC (controlled diabetic range): 6.2 % (ref 0.0–7.0)

## 2022-01-02 MED ORDER — ALBUTEROL SULFATE HFA 108 (90 BASE) MCG/ACT IN AERS: INHALATION_SPRAY | RESPIRATORY_TRACT | 1 refills | Status: DC

## 2022-01-02 MED ORDER — HYDROCHLOROTHIAZIDE 25 MG PO TABS: ORAL_TABLET | ORAL | 1 refills | Status: DC

## 2022-01-02 MED ORDER — BUSPIRONE HCL 7.5 MG PO TABS: 7.5000 mg | ORAL_TABLET | Freq: Three times a day (TID) | ORAL | 1 refills | Status: DC

## 2022-01-02 MED ORDER — TRULICITY 1.5 MG/0.5ML ~~LOC~~ SOAJ: 1.5000 mg | SUBCUTANEOUS | 3 refills | Status: DC

## 2022-01-02 MED ORDER — ROSUVASTATIN CALCIUM 10 MG PO TABS: 10.0000 mg | ORAL_TABLET | Freq: Every day | ORAL | 1 refills | Status: DC

## 2022-01-02 MED ORDER — LOSARTAN POTASSIUM 100 MG PO TABS: 100.0000 mg | ORAL_TABLET | Freq: Every day | ORAL | 1 refills | Status: DC

## 2022-01-02 MED ORDER — FLUTICASONE PROPIONATE HFA 44 MCG/ACT IN AERO: 2.0000 | INHALATION_SPRAY | Freq: Two times a day (BID) | RESPIRATORY_TRACT | 12 refills | Status: DC

## 2022-01-02 MED ORDER — FLUTICASONE PROPIONATE 50 MCG/ACT NA SUSP: 1.0000 | Freq: Every day | NASAL | 3 refills | Status: DC | PRN

## 2022-01-03 LAB — SPECIMEN STATUS REPORT

## 2022-01-04 ENCOUNTER — Encounter (INDEPENDENT_AMBULATORY_CARE_PROVIDER_SITE_OTHER): Payer: Self-pay | Admitting: Primary Care

## 2022-01-04 NOTE — Progress Notes (Signed)
[place], is a 51 y.o. female  OJJ:[id number]  HBZ:[id number]  DOB - Feb 16, 1971  Chief Complaint  Patient presents with   Diabetes       Subjective:   Catherine [name] is a 50 y.o. female here today for a follow up visit for the management of comorbidities.  Hypertension -patient has No headache, No chest pain, No abdominal pain - No Nausea, No new weakness tingling or numbness, No Cough - shortness of breath, diabetes, she denies increase in thirst, hunger, urination or visual changes.  No problems updated.  Allergies  Allergen Reactions   Metformin And Related Other (See Comments)    Abdominal discomfort.   Latex Rash    Past Medical History:  Diagnosis Date   Allergy    seasonal allergies   Anemia    hx of   Anxiety    on meds   Blood transfusion without reported diagnosis    x 3   Bronchitis    Depression    on meds   Diabetes mellitus without complication ([place])    on meds   Hyperlipidemia    on meds   Hypertension    on meds   Sleep apnea    no CPAP machine being used at this time (08/19/2020)    Current Outpatient Medications on File Prior to Visit  Medication Sig Dispense Refill   acetaminophen (TYLENOL) 500 MG tablet Take 1,000 mg by mouth every 6 (six) hours as needed for moderate pain or headache.     metroNIDAZOLE (FLAGYL) 500 MG tablet Take 1 tablet (500 mg total) by mouth 2 (two) times daily. 14 tablet 0   Sodium Sulfate-Mag Sulfate-KCl (SUTAB) [phone] MG TABS Take 1 kit by mouth as directed. MANUFACTURER CODES!! BIN: [id number] PCN: CN GROUP: [id number] MEMBER ID: [id number];GQQ AS SECONDARY INSURANCE ;NO PRIOR AUTHORIZATION 24 tablet 0   No current facility-administered medications on file prior to visit.    Objective:   Vitals:   01/02/22 1056  BP: 126/89  Pulse: 83  Resp: 16  SpO2: 95%  Weight: (!) 304 lb 12.8 oz (138.3 kg)    Exam General appearance : Awake, alert, not in any distress. Speech Clear.  Not toxic looking HEENT: Atraumatic and Normocephalic, pupils equally reactive to light and accomodation Neck: Supple, no JVD. No cervical lymphadenopathy.  Chest: Good air entry bilaterally, no added sounds  CVS: S1 S2 regular, no murmurs.  Abdomen: Bowel sounds present, Non tender and not distended with no gaurding, rigidity or rebound. Extremities: B/L Lower Ext shows no edema, both legs are warm to touch Neurology: Awake alert, and oriented X 3,, Non focal Skin: No Rash  Data Review Lab Results  Component Value Date   HGBA1C 6.2 01/02/2022   HGBA1C 6.6 (A) 10/01/2021   HGBA1C 6.8 (A) 04/09/2021    Assessment & Plan   1. Type 2 diabetes mellitus without complication, without long-term current use of insulin (HCC) Has been improving previous A1c was 6.6 -3 months ago today 6.2 Managed by Trulicity 1.5 weekly with lifestyle modifications of monitoring carbohydrates and exercising as tolerated.  Patient endorses weight loss with this current regiment. - Lipid Panel - POCT glycosylated hemoglobin (Hb A1C) - Microalbumin / creatinine urine ratio  2. Essential hypertension Blood pressure  controlled on monotherapy continue a low-sodium, DASH diet, medication compliance, 150 minutes of moderate intensity exercise per week. Discussed medication compliance, adverse effects.  - CMP14+EGFR - hydrochlorothiazide (HYDRODIURIL) 25 MG tablet; TAKE 1 TABLET EVERY  MORNING  Dispense: 90 tablet; Refill: 1  3. Healthcare maintenance - HCV Ab w Reflex to Quant PCR  4. Medication refill  - hydrochlorothiazide (HYDRODIURIL) 25 MG tablet; TAKE 1 TABLET EVERY MORNING  Dispense: 90 tablet; Refill: 1 - fluticasone (FLOVENT HFA) 44 MCG/ACT inhaler; Inhale 2 puffs into the lungs 2 (two) times daily.  Dispense: 1 each; Refill: 12 - busPIRone (BUSPAR) 7.5 MG tablet; Take 1 tablet (7.5 mg total) by mouth 3 (three) times daily.  Dispense: 270 tablet; Refill: 1 - albuterol (VENTOLIN HFA) 108 (90 Base)  MCG/ACT inhaler; INHALE 2 PUFFS INTO THE LUNGS 4 TIMES A DAY  Dispense: 18 each; Refill: 1  5. History of acute bronchitis with bronchospasm - fluticasone (FLOVENT HFA) 44 MCG/ACT inhaler; Inhale 2 puffs into the lungs 2 (two) times daily.  Dispense: 1 each; Refill: 12 - albuterol (VENTOLIN HFA) 108 (90 Base) MCG/ACT inhaler; INHALE 2 PUFFS INTO THE LUNGS 4 TIMES A DAY  Dispense: 18 each; Refill: 1  6. Generalized anxiety disorder - busPIRone (BUSPAR) 7.5 MG tablet; Take 1 tablet (7.5 mg total) by mouth 3 (three) times daily.  Dispense: 270 tablet; Refill: 1  7. Morbid obesity ([place]) Morbid obesity is BMI greater than 40 indicating an excess in caloric intake or underlining conditions. This may lead to other co-morbidities. Lifestyle modifications of diet and exercise may reduce  obesity.  Trulicity has contributed with weight loss and controlling her diabetes.  Patient have been counseled extensively about nutrition and exercise. Other issues discussed during this visit include: low cholesterol diet, weight control and daily exercise, foot care, annual eye examinations at Ophthalmology, importance of adherence with medications and regular follow-up. We also discussed long term complications of uncontrolled diabetes and hypertension.   Return in about 6 months (around 07/03/2022), or BP/DM.  The patient was given clear instructions to go to ER or return to medical center if symptoms don't improve, worsen or new problems develop. The patient verbalized understanding. The patient was told to call to get lab results if they haven't heard anything in the next week.   This note has been created with Surveyor, quantity. Any transcriptional errors are unintentional.   Kerin Perna, NP 01/04/2022, 1:52 PM

## 2022-01-05 ENCOUNTER — Other Ambulatory Visit: Payer: Self-pay | Admitting: Pharmacist

## 2022-01-05 ENCOUNTER — Other Ambulatory Visit: Payer: Self-pay

## 2022-01-05 MED ORDER — BUDESONIDE 180 MCG/ACT IN AEPB: 1.0000 | INHALATION_SPRAY | Freq: Two times a day (BID) | RESPIRATORY_TRACT | 3 refills | Status: DC

## 2022-01-06 LAB — CMP14+EGFR
ALT: 18 IU/L (ref 0–32)
AST: 17 IU/L (ref 0–40)
Albumin/Globulin Ratio: 1.5 (ref 1.2–2.2)
Albumin: 4.5 g/dL (ref 3.8–4.9)
Alkaline Phosphatase: 80 IU/L (ref 44–121)
BUN/Creatinine Ratio: 16 (ref 9–23)
BUN: 15 mg/dL (ref 6–24)
Bilirubin Total: 0.5 mg/dL (ref 0.0–1.2)
CO2: 27 mmol/L (ref 20–29)
Calcium: 9.9 mg/dL (ref 8.7–10.2)
Chloride: 99 mmol/L (ref 96–106)
Creatinine, Ser: 0.92 mg/dL (ref 0.57–1.00)
Globulin, Total: 3.1 g/dL (ref 1.5–4.5)
Glucose: 98 mg/dL (ref 70–99)
Potassium: 4.3 mmol/L (ref 3.5–5.2)
Sodium: 141 mmol/L (ref 134–144)
Total Protein: 7.6 g/dL (ref 6.0–8.5)
eGFR: 75 mL/min/{1.73_m2} (ref >59)

## 2022-01-06 LAB — LIPID PANEL
Chol/HDL Ratio: 3.3 ratio (ref 0.0–4.4)
Cholesterol, Total: 189 mg/dL (ref 100–199)
HDL: 58 mg/dL (ref >39)
LDL Chol Calc (NIH): 118 mg/dL — ABNORMAL HIGH (ref 0–99)
Triglycerides: 72 mg/dL (ref 0–149)
VLDL Cholesterol Cal: 13 mg/dL (ref 5–40)

## 2022-01-07 LAB — MICROALBUMIN / CREATININE URINE RATIO
Creatinine, Urine: 298.9 mg/dL
Microalb/Creat Ratio: 5 mg/g creat (ref 0–29)
Microalbumin, Urine: 15.5 ug/mL

## 2022-01-07 LAB — SPECIMEN STATUS REPORT

## 2022-01-07 LAB — HCV INTERPRETATION

## 2022-01-07 LAB — HCV AB W REFLEX TO QUANT PCR: HCV Ab: NONREACTIVE

## 2022-03-09 ENCOUNTER — Encounter (INDEPENDENT_AMBULATORY_CARE_PROVIDER_SITE_OTHER): Payer: Self-pay | Admitting: Primary Care

## 2022-03-09 DIAGNOSIS — I10 Essential (primary) hypertension: Secondary | ICD-10-CM

## 2022-03-11 NOTE — Telephone Encounter (Signed)
Called patient discussed current hardship and unable to get medications, stressed , anxiety and insomnia. Denies harm to self or others. Patient to remind writer to contact CP and CNM for options to assist. Office closed for Thanksgiving Holiday.

## 2022-03-16 ENCOUNTER — Other Ambulatory Visit: Payer: Self-pay

## 2022-03-16 ENCOUNTER — Other Ambulatory Visit (INDEPENDENT_AMBULATORY_CARE_PROVIDER_SITE_OTHER): Payer: Self-pay | Admitting: Primary Care

## 2022-03-16 ENCOUNTER — Telehealth (INDEPENDENT_AMBULATORY_CARE_PROVIDER_SITE_OTHER): Payer: Self-pay | Admitting: Primary Care

## 2022-03-16 DIAGNOSIS — Z760 Encounter for issue of repeat prescription: Secondary | ICD-10-CM

## 2022-03-16 DIAGNOSIS — F411 Generalized anxiety disorder: Secondary | ICD-10-CM

## 2022-03-16 MED ORDER — BUSPIRONE HCL 7.5 MG PO TABS
7.5000 mg | ORAL_TABLET | Freq: Three times a day (TID) | ORAL | 1 refills | Status: DC
  Filled 2022-03-16: qty 270, 90d supply, fill #0
  Filled 2022-03-16: qty 90, 30d supply, fill #0

## 2022-03-16 NOTE — Telephone Encounter (Signed)
Spoke with clinical pharmacist can fill medication and make a payable account . Patient made aware medication for anxiety can be pick up at [facility]

## 2022-03-16 NOTE — Telephone Encounter (Signed)
Will forward to provider  

## 2022-03-16 NOTE — Telephone Encounter (Signed)
Going on a month or so since being on her anxiety meds and trulicity / pt called to speak with Sharyn Lull about this asap today / please advise

## 2022-03-17 MED ORDER — TRULICITY 0.75 MG/0.5ML ~~LOC~~ SOAJ: 0.7500 mg | SUBCUTANEOUS | 4 refills | Status: DC

## 2022-03-19 ENCOUNTER — Other Ambulatory Visit (INDEPENDENT_AMBULATORY_CARE_PROVIDER_SITE_OTHER): Payer: Self-pay | Admitting: Primary Care

## 2022-03-19 DIAGNOSIS — I10 Essential (primary) hypertension: Secondary | ICD-10-CM

## 2022-03-19 DIAGNOSIS — Z760 Encounter for issue of repeat prescription: Secondary | ICD-10-CM

## 2022-03-19 NOTE — Telephone Encounter (Signed)
Unable to refill per protocol, Rx expired. Medication was discontinued 01/02/22. Will refuse.  Requested Prescriptions  Pending Prescriptions Disp Refills   losartan (COZAAR) 50 MG tablet [Pharmacy Med Name: LOSARTAN POTASSIUM 50 MG TAB] 180 tablet 1    Sig: TAKE 2 TABLETS BY MOUTH EVERY DAY     Cardiovascular:  Angiotensin Receptor Blockers Passed - 03/19/2022  1:22 AM      Passed - Cr in normal range and within 180 days    Creat  Date Value Ref Range Status  05/17/2014 0.81 0.50 - 1.10 mg/dL Final   Creatinine, Ser  Date Value Ref Range Status  01/02/2022 0.92 0.57 - 1.00 mg/dL Final         Passed - K in normal range and within 180 days    Potassium  Date Value Ref Range Status  01/02/2022 4.3 3.5 - 5.2 mmol/L Final         Passed - Patient is not pregnant      Passed - Last BP in normal range    BP Readings from Last 1 Encounters:  01/02/22 126/89         Passed - Valid encounter within last 6 months    Recent Outpatient Visits           2 months ago Type 2 diabetes mellitus without complication, without long-term current use of insulin ([place])   [place] [facility] Kerin Perna, NP   3 months ago Type 2 diabetes mellitus without complication, without long-term current use of insulin ([place])   [place], Annie Main L, RPH-CPP   5 months ago Type 2 diabetes mellitus without complication, without long-term current use of insulin [facility])   [place], Annie Main L, RPH-CPP   5 months ago Type 2 diabetes mellitus without complication, without long-term current use of insulin ([place])   [place] [facility] Juluis Mire P, NP   6 months ago Type 2 diabetes mellitus without complication, without long-term current use of insulin ([place])   [facility] Kerin Perna, NP       Future Appointments             In 3 months Oletta Lamas,  Milford Cage, NP [place]

## 2022-03-22 MED ORDER — LOSARTAN POTASSIUM 100 MG PO TABS: 100.0000 mg | ORAL_TABLET | Freq: Every day | ORAL | 1 refills | Status: DC

## 2022-04-04 ENCOUNTER — Other Ambulatory Visit (INDEPENDENT_AMBULATORY_CARE_PROVIDER_SITE_OTHER): Payer: Self-pay | Admitting: Primary Care

## 2022-04-05 ENCOUNTER — Encounter (INDEPENDENT_AMBULATORY_CARE_PROVIDER_SITE_OTHER): Payer: Self-pay | Admitting: Primary Care

## 2022-04-06 MED ORDER — ESCITALOPRAM OXALATE 20 MG PO TABS: 20.0000 mg | ORAL_TABLET | Freq: Every day | ORAL | 1 refills | Status: DC

## 2022-04-06 NOTE — Telephone Encounter (Signed)
Dc'd 04/07/21 by Juluis Mire NP   Requested Prescriptions  Refused Prescriptions Disp Refills   escitalopram (LEXAPRO) 20 MG tablet [Pharmacy Med Name: ESCITALOPRAM 20 MG TABLET] 90 tablet 1    Sig: TAKE 1 TABLET BY MOUTH EVERY DAY     Psychiatry:  Antidepressants - SSRI Passed - 04/04/2022  8:47 PM      Passed - Completed PHQ-2 or PHQ-9 in the last 360 days      Passed - Valid encounter within last 6 months    Recent Outpatient Visits           3 months ago Type 2 diabetes mellitus without complication, without long-term current use of insulin ([place])   [place] [facility] Kerin Perna, NP   4 months ago Type 2 diabetes mellitus without complication, without long-term current use of insulin ([place])   [place], Annie Main L, RPH-CPP   6 months ago Type 2 diabetes mellitus without complication, without long-term current use of insulin [facility])   [place], Annie Main L, RPH-CPP   6 months ago Type 2 diabetes mellitus without complication, without long-term current use of insulin ([place])   [place] [facility] Juluis Mire P, NP   7 months ago Type 2 diabetes mellitus without complication, without long-term current use of insulin ([place])   [facility] Kerin Perna, NP       Future Appointments             In 2 months Oletta Lamas, Milford Cage, NP [place]

## 2022-04-09 DIAGNOSIS — G4733 Obstructive sleep apnea (adult) (pediatric): Secondary | ICD-10-CM | POA: Diagnosis not present

## 2022-04-26 ENCOUNTER — Emergency Department (HOSPITAL_BASED_OUTPATIENT_CLINIC_OR_DEPARTMENT_OTHER)
Admission: EM | Admit: 2022-04-26 | Discharge: 2022-04-27 | Disposition: A | Discharge status: Home / Self Care | Payer: 59 | Attending: Emergency Medicine | Admitting: Emergency Medicine

## 2022-04-26 ENCOUNTER — Encounter (HOSPITAL_BASED_OUTPATIENT_CLINIC_OR_DEPARTMENT_OTHER): Payer: Self-pay

## 2022-04-26 ENCOUNTER — Other Ambulatory Visit: Payer: Self-pay

## 2022-04-26 DIAGNOSIS — Z91040 Latex allergy status: Secondary | ICD-10-CM | POA: Diagnosis not present

## 2022-04-26 DIAGNOSIS — Z79899 Other long term (current) drug therapy: Secondary | ICD-10-CM | POA: Diagnosis not present

## 2022-04-26 DIAGNOSIS — Z794 Long term (current) use of insulin: Secondary | ICD-10-CM | POA: Diagnosis not present

## 2022-04-26 DIAGNOSIS — E119 Type 2 diabetes mellitus without complications: Secondary | ICD-10-CM | POA: Diagnosis not present

## 2022-04-26 DIAGNOSIS — Z20822 Contact with and (suspected) exposure to covid-19: Secondary | ICD-10-CM | POA: Diagnosis not present

## 2022-04-26 DIAGNOSIS — J101 Influenza due to other identified influenza virus with other respiratory manifestations: Secondary | ICD-10-CM | POA: Diagnosis not present

## 2022-04-26 DIAGNOSIS — R059 Cough, unspecified: Secondary | ICD-10-CM | POA: Diagnosis not present

## 2022-04-26 DIAGNOSIS — I10 Essential (primary) hypertension: Secondary | ICD-10-CM | POA: Diagnosis not present

## 2022-04-26 LAB — RESP PANEL BY RT-PCR (RSV, FLU A&B, COVID)  RVPGX2
Influenza A by PCR: NEGATIVE
Influenza B by PCR: POSITIVE — AB
Resp Syncytial Virus by PCR: NEGATIVE
SARS Coronavirus 2 by RT PCR: NEGATIVE

## 2022-04-26 NOTE — ED Triage Notes (Signed)
Pt arrives to ED BIB [facility] c/o cough, body aches and chill. Pt states she has been in contact with her family who all have the flu.  BP 128 Palpated HR 90 R 16 O2 93% RA CBG 133

## 2022-04-27 MED ORDER — IBUPROFEN 400 MG PO TABS
600.0000 mg | ORAL_TABLET | Freq: Once | ORAL | Status: AC
  Administered 2022-04-27: 600 mg via ORAL
  Filled 2022-04-27: qty 1

## 2022-04-27 NOTE — Discharge Instructions (Signed)
You were seen today and tested positive for influenza B.  Make sure that you are staying hydrated.  Take Tylenol or ibuprofen for any body aches, pains, fevers.

## 2022-04-27 NOTE — ED Provider Notes (Signed)
[facility] EMERGENCY DEPT Provider Note   CSN: [id number] Arrival date & time: 04/26/22  2009     History  Chief Complaint  Patient presents with   Cough    Catherine [name] is a 52 y.o. female.  HPI     This is a 52 year old female who presents with cough, body aches, chills.  Patient reports she has had multiple sick contacts including grandchildren with RSV and the flu.  Patient reports that she began to have symptoms on Friday.  She reports congestion and dry cough.  She states that generally today she has felt progressively unwell.  She took a shower and "felt weird."  She states that she felt like she had an anxiety attack.  She has a history of the same.  She states that last night she had chills in the middle of the night.  Unclear whether she has had a fever.  She has been drinking tea with some relief.  Home Medications Prior to Admission medications   Medication Sig Start Date End Date Taking? Authorizing Provider  acetaminophen (TYLENOL) 500 MG tablet Take 1,000 mg by mouth every 6 (six) hours as needed for moderate pain or headache.    [provider]  albuterol (VENTOLIN HFA) 108 (90 Base) MCG/ACT inhaler INHALE 2 PUFFS INTO THE LUNGS 4 TIMES A DAY 01/02/22   Kerin Perna, NP  budesonide (PULMICORT) 180 MCG/ACT inhaler Inhale 1 puff into the lungs 2 (two) times daily. 01/05/22   Charlott Rakes, MD  busPIRone (BUSPAR) 7.5 MG tablet Take 1 tablet (7.5 mg total) by mouth 3 (three) times daily. 03/16/22   Kerin Perna, NP  Dulaglutide (TRULICITY) 2.95 JO/8.4ZY SOPN Inject 0.75 mg into the skin once a week. 03/17/22   Kerin Perna, NP  escitalopram (LEXAPRO) 20 MG tablet Take 1 tablet (20 mg total) by mouth daily. 04/06/22   Kerin Perna, NP  fluticasone (FLONASE) 50 MCG/ACT nasal spray Place 1 spray into both nostrils daily as needed for allergies. 01/02/22   Kerin Perna, NP  hydrochlorothiazide (HYDRODIURIL) 25 MG  tablet TAKE 1 TABLET EVERY MORNING 01/02/22   Kerin Perna, NP  losartan (COZAAR) 100 MG tablet Take 1 tablet (100 mg total) by mouth daily. 03/22/22   Kerin Perna, NP  metroNIDAZOLE (FLAGYL) 500 MG tablet Take 1 tablet (500 mg total) by mouth 2 (two) times daily. 10/06/21   Kerin Perna, NP  rosuvastatin (CRESTOR) 10 MG tablet Take 1 tablet (10 mg total) by mouth daily. 01/02/22   Kerin Perna, NP  Sodium Sulfate-Mag Sulfate-KCl (SUTAB) [phone] MG TABS Take 1 kit by mouth as directed. MANUFACTURER CODES!! BIN: [id number] PCN: CN GROUP: [id number] MEMBER ID: [id number];JSE AS SECONDARY INSURANCE ;NO PRIOR AUTHORIZATION 01/14/21   Jackquline Denmark, MD      Allergies    Metformin and related and Latex    Review of Systems   Review of Systems  Constitutional:  Positive for chills. Negative for fever.  HENT:  Positive for congestion.   Respiratory:  Positive for cough.   All other systems reviewed and are negative.   Physical Exam Updated Vital Signs BP (!) 133/94   Pulse 88   Temp 99.7 F (37.6 C) (Oral)   Resp 20   Ht 1.702 m ('5\' 7"'$ )   Wt (!) 140.2 kg   SpO2 96%   BMI 48.40 kg/m  Physical Exam Vitals and nursing note reviewed.  Constitutional:      Appearance:  She is well-developed. She is obese. She is not ill-appearing.  HENT:     Head: Normocephalic and atraumatic.     Mouth/Throat:     Mouth: Mucous membranes are moist.  Eyes:     Pupils: Pupils are equal, round, and reactive to light.  Cardiovascular:     Rate and Rhythm: Normal rate and regular rhythm.     Heart sounds: Normal heart sounds.  Pulmonary:     Effort: Pulmonary effort is normal. No respiratory distress.     Breath sounds: No wheezing.  Abdominal:     Palpations: Abdomen is soft.  Musculoskeletal:     Cervical back: Neck supple.  Skin:    General: Skin is warm and dry.  Neurological:     Mental Status: She is alert and oriented to person, place, and time.  Psychiatric:         Mood and Affect: Mood normal.     ED Results / Procedures / Treatments   Labs (all labs ordered are listed, but only abnormal results are displayed) Labs Reviewed  RESP PANEL BY RT-PCR (RSV, FLU A&B, COVID)  [id number] - Abnormal; Notable for the following components:      Result Value   Influenza B by PCR POSITIVE (*)    All other components within normal limits    EKG None  Radiology No results found.  Procedures Procedures    Medications Ordered in ED Medications  ibuprofen (ADVIL) tablet 600 mg (has no administration in time range)    ED Course/ Medical Decision Making/ A&P                           Medical Decision Making  This patient presents to the ED for concern of cough, upper respiratory symptoms, this involves an extensive number of treatment options, and is a complaint that carries with it a high risk of complications and morbidity.  I considered the following differential and admission for this acute, potentially life threatening condition.  The differential diagnosis includes viral illness such as COVID or influenza, pneumonia, bronchospasm  MDM:    This is a 52 year old female who presents with upper respiratory symptoms.  She is overall nontoxic and afebrile here.  She is not in any respiratory distress.  Multiple sick contacts at home.  COVID, influenza, RSV testing sent from triage.  She is influenza B positive.  Breath sounds at this time are clear.  She is not hypoxic.  Doubt bacterial pneumonia.  We discussed supportive measures at home including hydration, Tylenol, ibuprofen for any body aches or pains or fever.  At this time do not feel she needs chest x-ray or further workup.  (Labs, imaging, consults)  Labs: I Ordered, and personally interpreted labs.  The pertinent results include: COVID, influenza testing  Imaging Studies ordered: I ordered imaging studies including none I independently visualized and interpreted imaging. I agree with the  radiologist interpretation  Additional history obtained from chart review.  External records from outside source obtained and reviewed including prior evaluations  Cardiac Monitoring: The patient was maintained on a cardiac monitor.  I personally viewed and interpreted the cardiac monitored which showed an underlying rhythm of: Sinus rhythm  Reevaluation: After the interventions noted above, I reevaluated the patient and found that they have :stayed the same  Social Determinants of Health:  lives independently  Disposition: Discharge  Co morbidities that complicate the patient evaluation  Past Medical History:  Diagnosis Date  Allergy    seasonal allergies   Anemia    hx of   Anxiety    on meds   Blood transfusion without reported diagnosis    x 3   Bronchitis    Depression    on meds   Diabetes mellitus without complication ([place])    on meds   Hyperlipidemia    on meds   Hypertension    on meds   Sleep apnea    no CPAP machine being used at this time (08/19/2020)     Medicines Meds ordered this encounter  Medications   ibuprofen (ADVIL) tablet 600 mg    I have reviewed the patients home medicines and have made adjustments as needed  Problem List / ED Course: Problem List Items Addressed This Visit   None Visit Diagnoses     Influenza B    -  Primary                   Final Clinical Impression(s) / ED Diagnoses Final diagnoses:  Influenza B    Rx / DC Orders ED Discharge Orders     None         Merryl Hacker, MD 04/27/22 [phone]

## 2022-06-04 ENCOUNTER — Encounter (INDEPENDENT_AMBULATORY_CARE_PROVIDER_SITE_OTHER): Payer: Self-pay | Admitting: Primary Care

## 2022-06-05 ENCOUNTER — Encounter (INDEPENDENT_AMBULATORY_CARE_PROVIDER_SITE_OTHER): Payer: Self-pay

## 2022-06-05 ENCOUNTER — Telehealth (INDEPENDENT_AMBULATORY_CARE_PROVIDER_SITE_OTHER): Payer: Self-pay | Admitting: Primary Care

## 2022-06-05 DIAGNOSIS — J209 Acute bronchitis, unspecified: Secondary | ICD-10-CM | POA: Diagnosis not present

## 2022-06-05 DIAGNOSIS — J018 Other acute sinusitis: Secondary | ICD-10-CM | POA: Diagnosis not present

## 2022-06-05 NOTE — Telephone Encounter (Signed)
Left message for patient to have an appointment to see provider before seeing [facility].

## 2022-06-09 ENCOUNTER — Encounter (INDEPENDENT_AMBULATORY_CARE_PROVIDER_SITE_OTHER): Payer: Self-pay | Admitting: Primary Care

## 2022-06-09 ENCOUNTER — Ambulatory Visit (INDEPENDENT_AMBULATORY_CARE_PROVIDER_SITE_OTHER): Payer: 59 | Admitting: Primary Care

## 2022-06-09 VITALS — BP 136/88 | HR 77 | Resp 16 | Wt 307.0 lb

## 2022-06-09 DIAGNOSIS — E119 Type 2 diabetes mellitus without complications: Secondary | ICD-10-CM

## 2022-06-09 MED ORDER — GLIMEPIRIDE 2 MG PO TABS: 2.0000 mg | ORAL_TABLET | Freq: Every day | ORAL | 3 refills | Status: DC

## 2022-06-09 NOTE — Progress Notes (Signed)
[place], is a 52 y.o. female  [id number]  [id number]  DOB - 10-22-1970  Chief Complaint  Patient presents with   Medication Problem    Trulicity       Subjective:   Catherine [name] is a 52 y.o. female here today for a follow up visit. Patient has No headache, No chest pain, No abdominal pain - No Nausea, No new weakness tingling or numbness, No Cough - shortness of breath  No problems updated.  Allergies  Allergen Reactions   Metformin And Related Other (See Comments)    Abdominal discomfort.   Latex Rash    Past Medical History:  Diagnosis Date   Allergy    seasonal allergies   Anemia    hx of   Anxiety    on meds   Blood transfusion without reported diagnosis    x 3   Bronchitis    Depression    on meds   Diabetes mellitus without complication ([place])    on meds   Hyperlipidemia    on meds   Hypertension    on meds   Sleep apnea    no CPAP machine being used at this time (08/19/2020)    Current Outpatient Medications on File Prior to Visit  Medication Sig Dispense Refill   acetaminophen (TYLENOL) 500 MG tablet Take 1,000 mg by mouth every 6 (six) hours as needed for moderate pain or headache.     albuterol (VENTOLIN HFA) 108 (90 Base) MCG/ACT inhaler INHALE 2 PUFFS INTO THE LUNGS 4 TIMES A DAY 18 each 1   budesonide (PULMICORT) 180 MCG/ACT inhaler Inhale 1 puff into the lungs 2 (two) times daily. 1 each 3   busPIRone (BUSPAR) 7.5 MG tablet Take 1 tablet (7.5 mg total) by mouth 3 (three) times daily. 270 tablet 1   escitalopram (LEXAPRO) 20 MG tablet Take 1 tablet (20 mg total) by mouth daily. 90 tablet 1   fluticasone (FLONASE) 50 MCG/ACT nasal spray Place 1 spray into both nostrils daily as needed for allergies. 11.1 mL 3   hydrochlorothiazide (HYDRODIURIL) 25 MG tablet TAKE 1 TABLET EVERY MORNING 90 tablet 1   losartan (COZAAR) 100 MG tablet Take 1 tablet (100 mg total) by mouth daily. 90 tablet 1   metroNIDAZOLE  (FLAGYL) 500 MG tablet Take 1 tablet (500 mg total) by mouth 2 (two) times daily. 14 tablet 0   rosuvastatin (CRESTOR) 10 MG tablet Take 1 tablet (10 mg total) by mouth daily. 90 tablet 1   Sodium Sulfate-Mag Sulfate-KCl (SUTAB) [phone] MG TABS Take 1 kit by mouth as directed. MANUFACTURER CODES!! BIN: [id number] PCN: CN GROUP: [id number] MEMBER ID: [id number] AS SECONDARY INSURANCE ;NO PRIOR AUTHORIZATION 24 tablet 0   No current facility-administered medications on file prior to visit.    Objective:   Vitals:   06/09/22 1119  BP: 136/88  Pulse: 77  Resp: 16  SpO2: 99%  Weight: (Abnormal) 307 lb (139.3 kg)    Comprehensive ROS Pertinent positive and negative noted in HPI   Exam General appearance : Awake, alert, not in any distress. Speech Clear. Not toxic looking HEENT: Atraumatic and Normocephalic, pupils equally reactive to light and accomodation Neck: Supple, no JVD. No cervical lymphadenopathy.  Chest: Good air entry bilaterally, no added sounds  CVS: S1 S2 regular, no murmurs.  Abdomen: Bowel sounds present, Non tender and not distended with no gaurding, rigidity or rebound. Extremities: B/L Lower Ext shows no edema, both legs are warm to  touch Neurology: Awake alert, and oriented X 3, CN II-XII intact, Non focal Skin: No Rash  Data Review Lab Results  Component Value Date   HGBA1C 6.2 01/02/2022   HGBA1C 6.6 (A) 10/01/2021   HGBA1C 6.8 (A) 04/09/2021    Assessment & Plan  Catherine [name] was seen today for medication problem.  Diagnoses and all orders for this visit:  Type 2 diabetes mellitus without complication, without long-term current use of insulin (HCC) -     glimepiride (AMARYL) 2 MG tablet; Take 1 tablet (2 mg total) by mouth daily before breakfast.    Patient have been counseled extensively about nutrition and exercise. Other issues discussed during this visit include: low cholesterol diet, weight control and daily exercise, foot care, annual eye  examinations at Ophthalmology, importance of adherence with medications and regular follow-up. We also discussed long term complications of uncontrolled diabetes and hypertension.   Return in 4 weeks (on 07/07/2022) for pap adn a1c.  The patient was given clear instructions to go to ER or return to medical center if symptoms don't improve, worsen or new problems develop. The patient verbalized understanding. The patient was told to call to get lab results if they haven't heard anything in the next week.   This note has been created with Surveyor, quantity. Any transcriptional errors are unintentional.   Kerin Perna, NP 06/13/2022, 11:15 PM

## 2022-06-22 IMAGING — MG MM DIGITAL SCREENING BILAT W/ TOMO AND CAD
8 series · 8 of 24 positions shown · non-contrast
Comparison: Previous exam(s).

CLINICAL DATA: Screening.

EXAM:
DIGITAL SCREENING BILATERAL MAMMOGRAM WITH TOMOSYNTHESIS AND CAD
TECHNIQUE: Bilateral screening digital craniocaudal and mediolateral oblique
mammograms were obtained. Bilateral screening digital breast
tomosynthesis was performed. The images were evaluated with
computer-aided detection.

[L CC synth-2D]
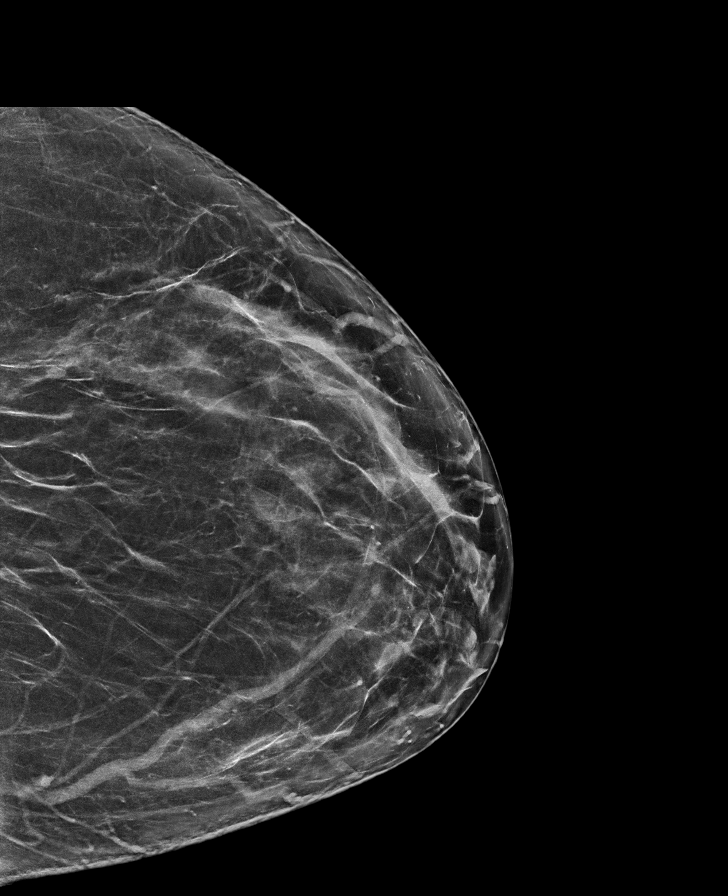

[R CC synth-2D]
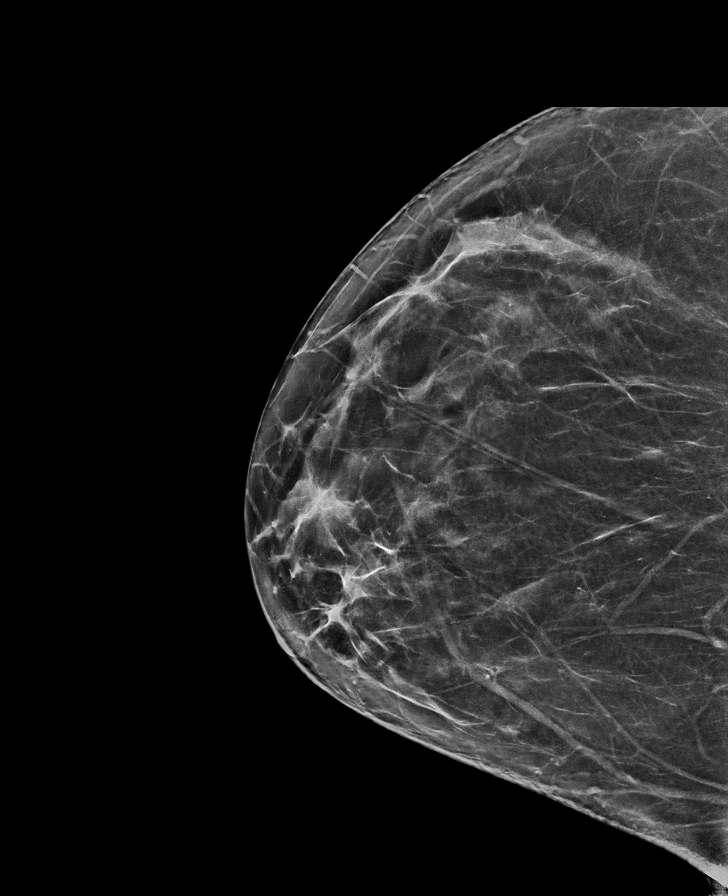

[R MLO synth-2D]
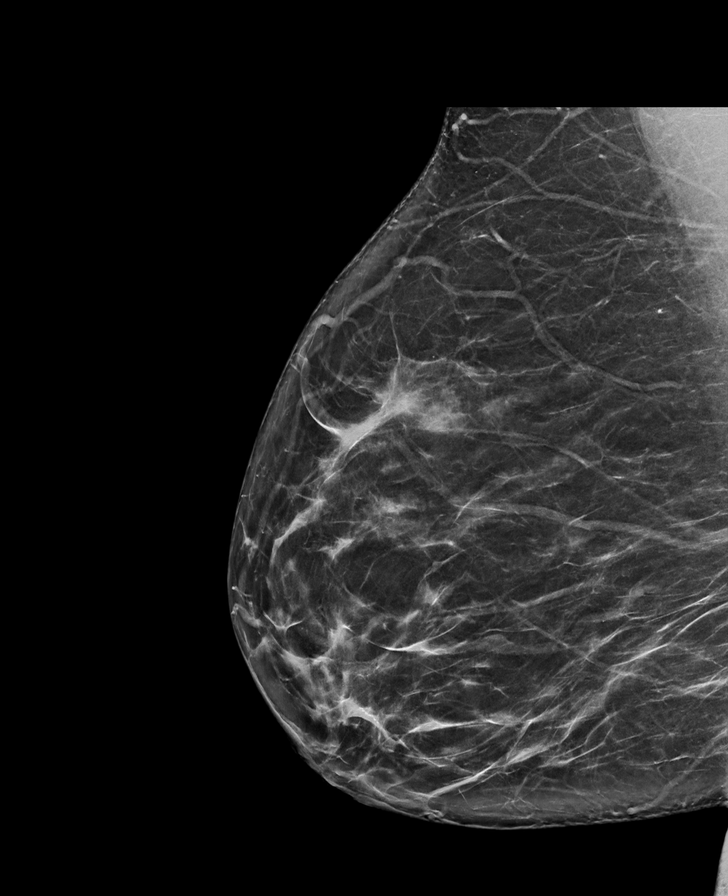

[L MLO synth-2D]
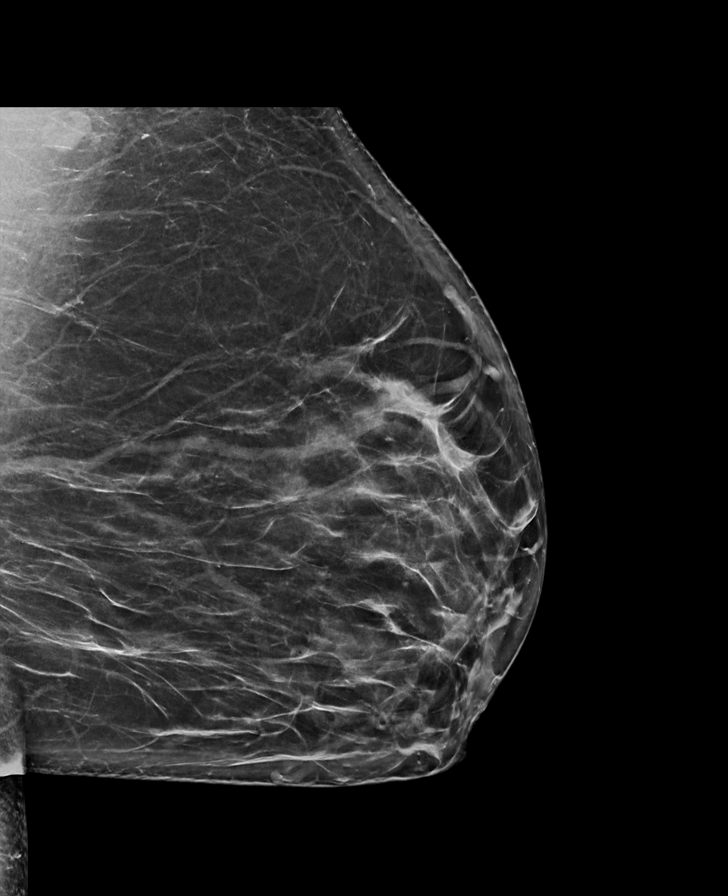

[L CC tomo · tomo slice 43/84.0]
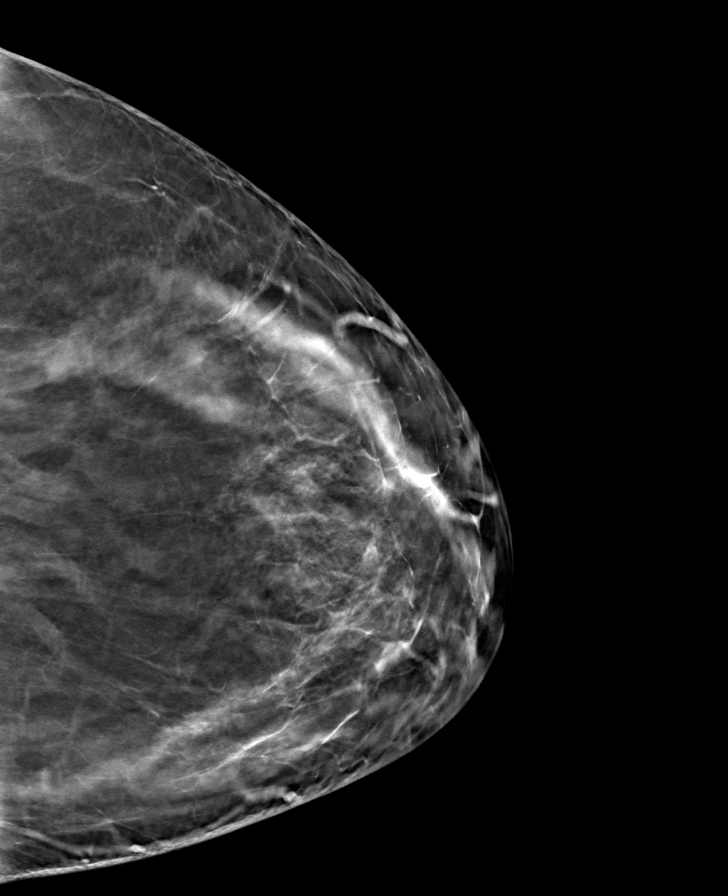

[L MLO tomo · tomo slice 45/89.0]
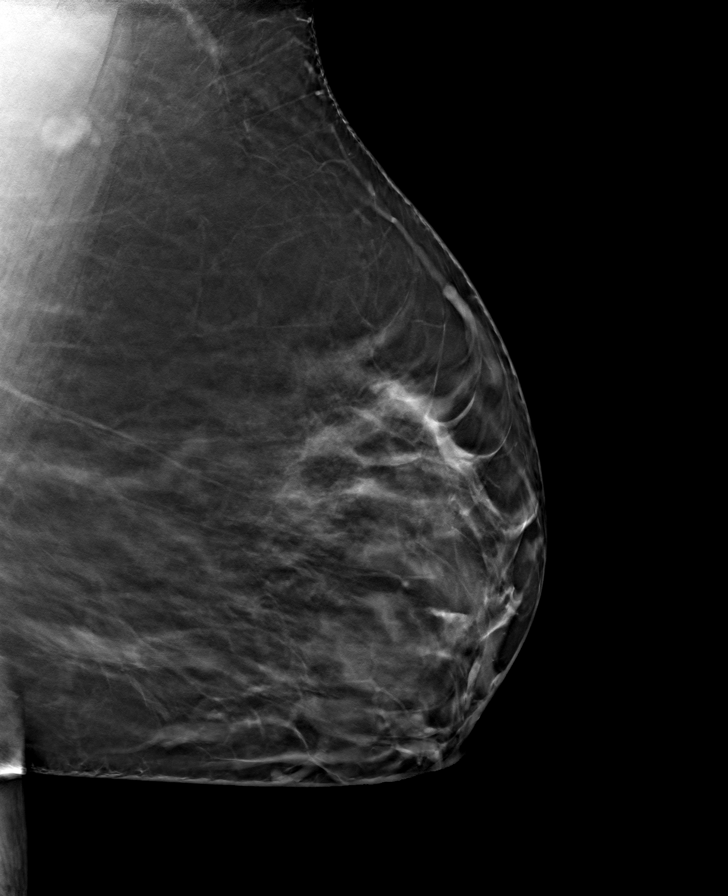

[R CC tomo · tomo slice 42/83.0]
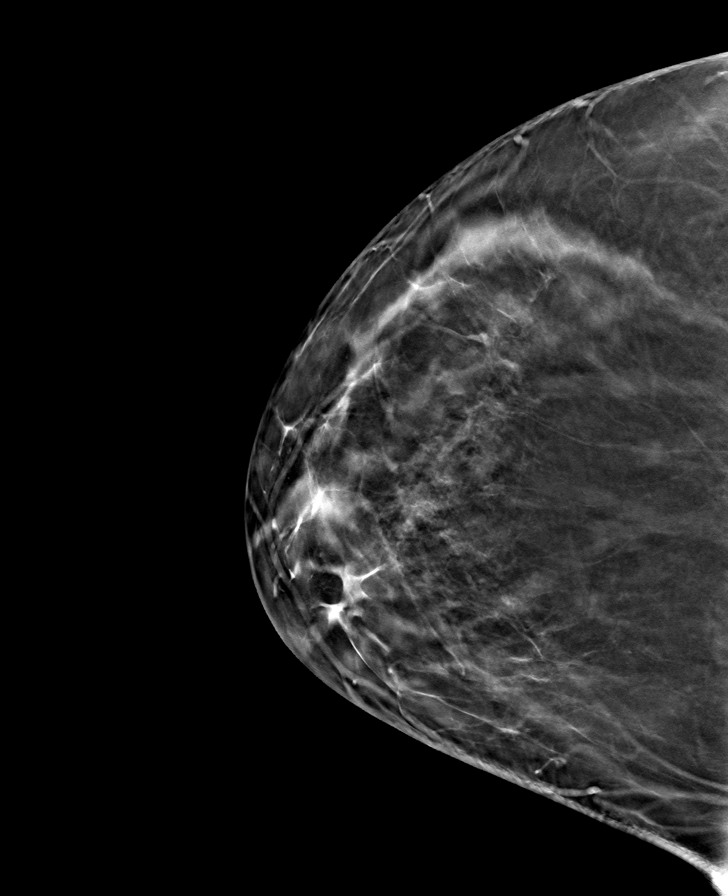

[R MLO tomo · tomo slice 43/84.0]
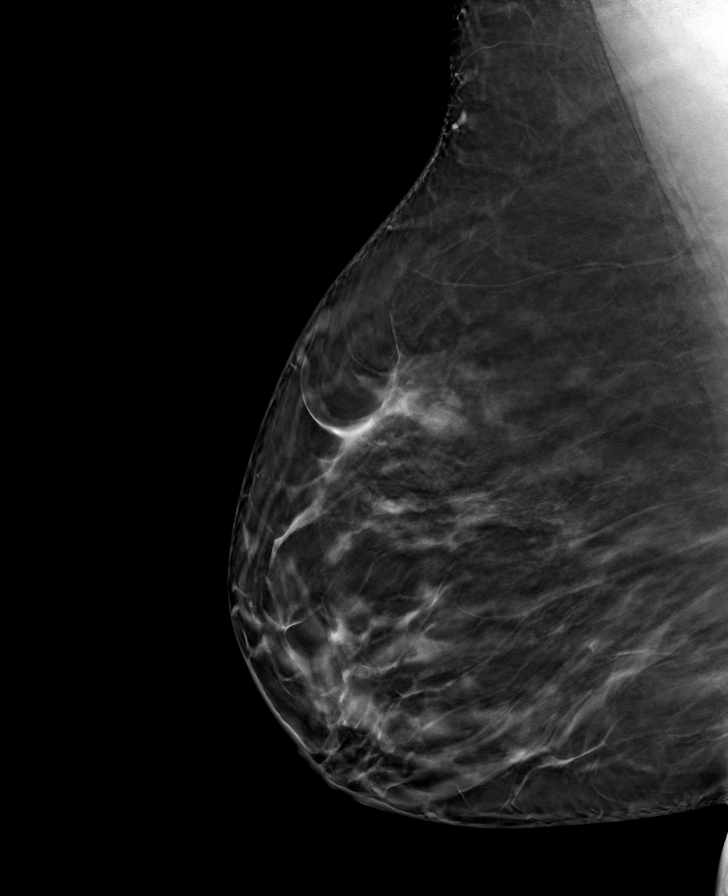

[8 of 24 positions shown; findings below may reference images not displayed]

ACR Breast Density Category b: There are scattered areas of
fibroglandular density.
FINDINGS: There are no findings suspicious for malignancy. The images were
evaluated with computer-aided detection.
IMPRESSION: No mammographic evidence of malignancy. A result letter of this
screening mammogram will be mailed directly to the patient.

RECOMMENDATION:
Screening mammogram in one year. (Code:WJ-I-BG6)

BI-RADS CATEGORY  1: Negative.

## 2022-07-01 ENCOUNTER — Other Ambulatory Visit: Payer: Self-pay | Admitting: Primary Care

## 2022-07-01 DIAGNOSIS — Z1231 Encounter for screening mammogram for malignant neoplasm of breast: Secondary | ICD-10-CM

## 2022-07-03 ENCOUNTER — Encounter (INDEPENDENT_AMBULATORY_CARE_PROVIDER_SITE_OTHER): Payer: Self-pay | Admitting: Primary Care

## 2022-07-03 ENCOUNTER — Ambulatory Visit (INDEPENDENT_AMBULATORY_CARE_PROVIDER_SITE_OTHER): Payer: 59 | Admitting: Primary Care

## 2022-07-03 ENCOUNTER — Other Ambulatory Visit (HOSPITAL_COMMUNITY)
Admission: RE | Admit: 2022-07-03 | Discharge: 2022-07-03 | Disposition: A | Discharge status: Home / Self Care | Payer: 59 | Source: Ambulatory Visit | Attending: Primary Care | Admitting: Primary Care

## 2022-07-03 VITALS — BP 132/88 | HR 62 | Resp 16 | Wt 306.6 lb

## 2022-07-03 DIAGNOSIS — Z124 Encounter for screening for malignant neoplasm of cervix: Secondary | ICD-10-CM | POA: Insufficient documentation

## 2022-07-03 DIAGNOSIS — E119 Type 2 diabetes mellitus without complications: Secondary | ICD-10-CM | POA: Diagnosis not present

## 2022-07-03 LAB — POCT GLYCOSYLATED HEMOGLOBIN (HGB A1C): HbA1c, POC (controlled diabetic range): 6.6 % (ref 0.0–7.0)

## 2022-07-03 NOTE — Progress Notes (Signed)
  [facility] Patient name: Catherine [name] MRN [id number]  Date of birth: Mar 19, 1971 Chief Complaint:   Diabetes and Gynecologic Exam  History of Present Illness:   Catherine [name] is a 52 y.o. [phone] female being seen today for a routine well-woman exam.   CC:@visit  info@   The current method of family planning is status post hysterectomy.  No LMP recorded. Patient has had a hysterectomy. Last pap 04/10/19 . Results were: normal Last mammogram: 08/14/22. Results were: abnormal  ***. Family h/o breast cancer: No Last colonoscopy: 01/23/21. Results were: normal. Family h/o colorectal cancer: No  Review of Systems:    Denies any headaches, blurred vision, fatigue, shortness of breath, chest pain, abdominal pain, abnormal vaginal discharge/itching/odor/irritation, problems with periods, bowel movements, urination, or intercourse unless otherwise stated above.  Pertinent History Reviewed:   Reviewed past medical,surgical, social and family history.  Reviewed problem list, medications and allergies.  Physical Assessment:   Vitals:   07/03/22 1106  BP: 132/88  Pulse: 62  Resp: 16  SpO2: 96%  Weight: (Abnormal) 306 lb 9.6 oz (139.1 kg)  Body mass index is 48.02 kg/m.        Physical Examination:  General appearance - well appearing, and in no distress Mental status - alert, oriented to person, place, and time Psych:  She has a normal mood and affect Skin - warm and dry, normal color, no suspicious lesions noted Chest - effort normal, all lung fields clear to auscultation bilaterally Heart - normal rate and regular rhythm Neck:  midline trachea, no thyromegaly or nodules Breasts - breasts appear normal, no suspicious masses, no skin or nipple changes or axillary nodes Educated patient on proper self breast examination and had patient to demonstrate SBE. Abdomen - soft, nontender, nondistended, no masses or organomegaly Pelvic-VULVA:  normal appearing vulva with no masses, tenderness or lesions   VAGINA: normal appearing vagina with normal color and discharge, no lesions   CERVIX: normal appearing cervix without discharge or lesions, no CMT UTERUS: uterus is felt to be normal size, shape, consistency and nontender  ADNEXA: No adnexal masses or tenderness noted. Extremities:  No swelling or varicosities noted  Results for orders placed or performed in visit on 07/03/22 (from the past 24 hour(s))  POCT glycosylated hemoglobin (Hb A1C)   Collection Time: 07/03/22 11:07 AM  Result Value Ref Range   Hemoglobin A1C     HbA1c POC (<> result, manual entry)     HbA1c, POC (prediabetic range)     HbA1c, POC (controlled diabetic range) 6.6 0.0 - 7.0 %     Assessment & Plan:   Orders Placed This Encounter  Procedures   POCT glycosylated hemoglobin (Hb A1C)    Meds: No orders of the defined types were placed in this encounter.   Follow-up: No follow-ups on file.  This note has been created with Surveyor, quantity. Any transcriptional errors are unintentional.   Kerin Perna, NP 07/03/2022, 11:35 AM

## 2022-07-07 ENCOUNTER — Encounter (INDEPENDENT_AMBULATORY_CARE_PROVIDER_SITE_OTHER): Payer: Self-pay | Admitting: Primary Care

## 2022-07-07 DIAGNOSIS — Z113 Encounter for screening for infections with a predominantly sexual mode of transmission: Secondary | ICD-10-CM

## 2022-07-07 LAB — CERVICOVAGINAL ANCILLARY ONLY
Bacterial Vaginitis (gardnerella): NEGATIVE
Candida Glabrata: NEGATIVE
Candida Vaginitis: NEGATIVE
Chlamydia: NEGATIVE
Comment: NEGATIVE
Comment: NEGATIVE
Comment: NEGATIVE
Comment: NEGATIVE
Comment: NEGATIVE
Comment: NORMAL
Neisseria Gonorrhea: NEGATIVE
Trichomonas: NEGATIVE

## 2022-07-09 LAB — CYTOLOGY - PAP
Comment: NEGATIVE
Diagnosis: NEGATIVE
High risk HPV: NEGATIVE

## 2022-07-13 ENCOUNTER — Other Ambulatory Visit (INDEPENDENT_AMBULATORY_CARE_PROVIDER_SITE_OTHER): Payer: Self-pay | Admitting: Primary Care

## 2022-07-13 DIAGNOSIS — I10 Essential (primary) hypertension: Secondary | ICD-10-CM

## 2022-07-13 DIAGNOSIS — Z760 Encounter for issue of repeat prescription: Secondary | ICD-10-CM

## 2022-07-14 ENCOUNTER — Other Ambulatory Visit: Payer: Self-pay | Admitting: Pharmacist

## 2022-07-14 ENCOUNTER — Other Ambulatory Visit: Payer: Self-pay | Admitting: Family Medicine

## 2022-07-14 ENCOUNTER — Encounter (INDEPENDENT_AMBULATORY_CARE_PROVIDER_SITE_OTHER): Payer: Self-pay | Admitting: Primary Care

## 2022-07-14 MED ORDER — QVAR REDIHALER 80 MCG/ACT IN AERB: 1.0000 | INHALATION_SPRAY | Freq: Two times a day (BID) | RESPIRATORY_TRACT | 2 refills | Status: DC

## 2022-07-15 ENCOUNTER — Other Ambulatory Visit: Payer: Self-pay

## 2022-07-15 ENCOUNTER — Other Ambulatory Visit: Payer: Self-pay | Admitting: Family Medicine

## 2022-07-16 ENCOUNTER — Other Ambulatory Visit (INDEPENDENT_AMBULATORY_CARE_PROVIDER_SITE_OTHER): Payer: Self-pay | Admitting: Primary Care

## 2022-07-16 ENCOUNTER — Other Ambulatory Visit: Payer: Self-pay

## 2022-07-17 NOTE — Telephone Encounter (Signed)
Requested Prescriptions  Pending Prescriptions Disp Refills   escitalopram (LEXAPRO) 20 MG tablet [Pharmacy Med Name: ESCITALOPRAM 20 MG TABLET] 90 tablet 1    Sig: TAKE 1 TABLET BY MOUTH EVERY DAY     Psychiatry:  Antidepressants - SSRI Passed - 07/16/2022  9:40 PM      Passed - Completed PHQ-2 or PHQ-9 in the last 360 days      Passed - Valid encounter within last 6 months    Recent Outpatient Visits           2 weeks ago Type 2 diabetes mellitus without complication, without long-term current use of insulin ([place])   [place] Kerin Perna, NP   1 month ago Type 2 diabetes mellitus without complication, without long-term current use of insulin ([place])   [place] Kerin Perna, NP   6 months ago Type 2 diabetes mellitus without complication, without long-term current use of insulin ([place])   [place] Kerin Perna, NP   7 months ago Type 2 diabetes mellitus without complication, without long-term current use of insulin [facility])   [place], [place] L, RPH-CPP   9 months ago Type 2 diabetes mellitus without complication, without long-term current use of insulin [facility])   [place], RPH-CPP       Future Appointments             In 4 days Oletta Lamas, [place] Cage, NP [place]

## 2022-07-21 ENCOUNTER — Other Ambulatory Visit (INDEPENDENT_AMBULATORY_CARE_PROVIDER_SITE_OTHER): Payer: Self-pay

## 2022-07-21 ENCOUNTER — Other Ambulatory Visit (INDEPENDENT_AMBULATORY_CARE_PROVIDER_SITE_OTHER): Payer: 59

## 2022-07-21 DIAGNOSIS — Z113 Encounter for screening for infections with a predominantly sexual mode of transmission: Secondary | ICD-10-CM | POA: Diagnosis not present

## 2022-07-22 LAB — SPECIMEN STATUS REPORT

## 2022-07-22 LAB — HIV ANTIBODY (ROUTINE TESTING W REFLEX): HIV Screen 4th Generation wRfx: NONREACTIVE

## 2022-07-25 NOTE — Progress Notes (Signed)
Labs only

## 2022-08-10 DIAGNOSIS — M9914 Subluxation complex (vertebral) of sacral region: Secondary | ICD-10-CM | POA: Diagnosis not present

## 2022-08-10 DIAGNOSIS — M5116 Intervertebral disc disorders with radiculopathy, lumbar region: Secondary | ICD-10-CM | POA: Diagnosis not present

## 2022-08-10 DIAGNOSIS — M9905 Segmental and somatic dysfunction of pelvic region: Secondary | ICD-10-CM | POA: Diagnosis not present

## 2022-08-10 DIAGNOSIS — M9903 Segmental and somatic dysfunction of lumbar region: Secondary | ICD-10-CM | POA: Diagnosis not present

## 2022-08-11 DIAGNOSIS — M9903 Segmental and somatic dysfunction of lumbar region: Secondary | ICD-10-CM | POA: Diagnosis not present

## 2022-08-11 DIAGNOSIS — M9914 Subluxation complex (vertebral) of sacral region: Secondary | ICD-10-CM | POA: Diagnosis not present

## 2022-08-11 DIAGNOSIS — M5116 Intervertebral disc disorders with radiculopathy, lumbar region: Secondary | ICD-10-CM | POA: Diagnosis not present

## 2022-08-11 DIAGNOSIS — M9905 Segmental and somatic dysfunction of pelvic region: Secondary | ICD-10-CM | POA: Diagnosis not present

## 2022-08-13 DIAGNOSIS — M9914 Subluxation complex (vertebral) of sacral region: Secondary | ICD-10-CM | POA: Diagnosis not present

## 2022-08-13 DIAGNOSIS — M5116 Intervertebral disc disorders with radiculopathy, lumbar region: Secondary | ICD-10-CM | POA: Diagnosis not present

## 2022-08-13 DIAGNOSIS — M9903 Segmental and somatic dysfunction of lumbar region: Secondary | ICD-10-CM | POA: Diagnosis not present

## 2022-08-13 DIAGNOSIS — M9905 Segmental and somatic dysfunction of pelvic region: Secondary | ICD-10-CM | POA: Diagnosis not present

## 2022-08-14 ENCOUNTER — Ambulatory Visit
Admission: RE | Admit: 2022-08-14 | Discharge: 2022-08-14 | Disposition: A | Discharge status: Home / Self Care | Payer: 59 | Source: Ambulatory Visit | Attending: Primary Care | Admitting: Primary Care

## 2022-08-14 ENCOUNTER — Encounter (INDEPENDENT_AMBULATORY_CARE_PROVIDER_SITE_OTHER): Payer: Self-pay | Admitting: Primary Care

## 2022-08-14 ENCOUNTER — Ambulatory Visit (INDEPENDENT_AMBULATORY_CARE_PROVIDER_SITE_OTHER): Payer: 59 | Admitting: Primary Care

## 2022-08-14 VITALS — BP 133/84 | HR 96 | Resp 16 | Wt 315.6 lb

## 2022-08-14 DIAGNOSIS — Z1231 Encounter for screening mammogram for malignant neoplasm of breast: Secondary | ICD-10-CM

## 2022-08-14 DIAGNOSIS — F418 Other specified anxiety disorders: Secondary | ICD-10-CM

## 2022-08-14 DIAGNOSIS — G4733 Obstructive sleep apnea (adult) (pediatric): Secondary | ICD-10-CM | POA: Diagnosis not present

## 2022-08-18 NOTE — Progress Notes (Signed)
    [facility]  Subjective:   SHAYLE AGE is an 52 y.o. female who presents for evaluation and treatment of depressive symptoms.  Onset approximately 8 months ago, gradually worsening since that time. Patient has No headache, No chest pain, No abdominal pain - No Nausea, No new weakness tingling or numbness, No Cough - shortness of breath  Current symptoms include depressed mood, insomnia, fatigue, difficulty concentrating, anxiety, loss of energy/fatigue,.  Current treatment for depression:Individual therapy and Medication Sleep problems: Moderate   Early awakening:Mild   Energy: Limited Motivation: Fair Concentration: Limited Rumination/worrying: Moderate Memory: Fair Tearfulness: Moderate  Anxiety: Moderate  Panic: Absent  Overall Mood: No change  Hopelessness: Mild Suicidal ideation: Absent  Organic causes of depression present: None.  Review of Systems Pertinent items noted in HPI and remainder of comprehensive ROS otherwise negative.   Objective:   Blood Pressure 133/84   Pulse 96   Respiration 16   Weight (Abnormal) 315 lb 9.6 oz (143.2 kg)   Oxygen Saturation 98%   Body Mass Index 49.43 kg/m   Assessment:   Depressive Disorder: Suicide Risk Assessment:  Suicidal intent: no Suicidal plan: no Access to means for suicide: yes Lethality of means for suicide: no Prior suicide attempts: no Recent exposure to suicide:no   Plan:   Lyndsay was seen today for anxiety and depression.  Diagnoses and all orders for this visit:  Depression with anxiety Flowsheet Row Office Visit from 08/14/2022 in [facility]  PHQ-9 Total Score 17      Refer to LCS and continue medication as prescribed    Reviewed concept of depression as biochemical imbalance of neurotransmitters and rationale for treatment. Instructed patient to contact office or on-call physician promptly should condition worsen or any new symptoms appear and  provided on-call telephone numbers.    This note has been created with Education officer, environmental. Any transcriptional errors are unintentional.   Grayce Sessions, NP 08/26/2022, 6:28 PM

## 2022-08-19 DIAGNOSIS — M5116 Intervertebral disc disorders with radiculopathy, lumbar region: Secondary | ICD-10-CM | POA: Diagnosis not present

## 2022-08-19 DIAGNOSIS — M9903 Segmental and somatic dysfunction of lumbar region: Secondary | ICD-10-CM | POA: Diagnosis not present

## 2022-08-19 DIAGNOSIS — M9905 Segmental and somatic dysfunction of pelvic region: Secondary | ICD-10-CM | POA: Diagnosis not present

## 2022-08-19 DIAGNOSIS — M9914 Subluxation complex (vertebral) of sacral region: Secondary | ICD-10-CM | POA: Diagnosis not present

## 2022-08-26 DIAGNOSIS — M5116 Intervertebral disc disorders with radiculopathy, lumbar region: Secondary | ICD-10-CM | POA: Diagnosis not present

## 2022-08-26 DIAGNOSIS — M9914 Subluxation complex (vertebral) of sacral region: Secondary | ICD-10-CM | POA: Diagnosis not present

## 2022-08-26 DIAGNOSIS — M9903 Segmental and somatic dysfunction of lumbar region: Secondary | ICD-10-CM | POA: Diagnosis not present

## 2022-08-26 DIAGNOSIS — M9905 Segmental and somatic dysfunction of pelvic region: Secondary | ICD-10-CM | POA: Diagnosis not present

## 2022-09-01 ENCOUNTER — Encounter (INDEPENDENT_AMBULATORY_CARE_PROVIDER_SITE_OTHER): Payer: Self-pay | Admitting: Primary Care

## 2022-09-01 ENCOUNTER — Ambulatory Visit (INDEPENDENT_AMBULATORY_CARE_PROVIDER_SITE_OTHER): Payer: 59 | Admitting: Primary Care

## 2022-09-01 VITALS — BP 127/83 | HR 75 | Resp 16 | Wt 315.4 lb

## 2022-09-01 DIAGNOSIS — F418 Other specified anxiety disorders: Secondary | ICD-10-CM | POA: Diagnosis not present

## 2022-09-01 NOTE — Patient Instructions (Signed)
[email or web address]

## 2022-09-01 NOTE — Progress Notes (Unsigned)
   Established Patient Office Visit  Subjective   Patient ID: MAMYE PECH, female    DOB: 1971-01-11  Age: 52 y.o. MRN: [id number]  Chief Complaint  Patient presents with  . Anxiety  . Depression  . Shortness of Breath    For 2 weeks since starting to use cpap     HPI Catherine [name] is 52 year old morbid obese female presents for anxiety and depression nothing she tries to do seems right or fixing any problems needless say weight gain is also causing more depression and self body image. Staying somewhere but not here own. Hates her job but helps pays the bills. Hyperventilating , stuttering and shaking all over. Started on CPAP over the weekend wakes up with sore throat and nasal congestion. Denies suicidal ideation, harm to others, no auditory or visual hallucination . Nothing in life wants or feels like it is going right.   ROS    Objective:     Blood Pressure 127/83   Pulse 75   Respiration 16   Weight (Abnormal) 315 lb 6.4 oz (143.1 kg)   Oxygen Saturation 96%   Body Mass Index 49.40 kg/m  {Vitals History (Optional):23777}  Physical Exam   No results found for any visits on 09/01/22.  {Labs (Optional):23779}  The 10-year ASCVD risk score (Arnett DK, et al., 2019) is: 8.1%    Assessment & Plan:   Problem List Items Addressed This Visit   None   No follow-ups on file.    Catherine Sessions, NP

## 2022-09-02 ENCOUNTER — Telehealth (INDEPENDENT_AMBULATORY_CARE_PROVIDER_SITE_OTHER): Payer: Self-pay | Admitting: Licensed Clinical Social Worker

## 2022-09-02 ENCOUNTER — Telehealth: Payer: Self-pay | Admitting: Licensed Clinical Social Worker

## 2022-09-02 DIAGNOSIS — M9914 Subluxation complex (vertebral) of sacral region: Secondary | ICD-10-CM | POA: Diagnosis not present

## 2022-09-02 DIAGNOSIS — M5116 Intervertebral disc disorders with radiculopathy, lumbar region: Secondary | ICD-10-CM | POA: Diagnosis not present

## 2022-09-02 DIAGNOSIS — M9905 Segmental and somatic dysfunction of pelvic region: Secondary | ICD-10-CM | POA: Diagnosis not present

## 2022-09-02 DIAGNOSIS — M9903 Segmental and somatic dysfunction of lumbar region: Secondary | ICD-10-CM | POA: Diagnosis not present

## 2022-09-02 NOTE — Telephone Encounter (Signed)
Met with pt yesterday during a warm hand off. Pt was very tearful and experiencing a panic attack. Pt was able to process her feelings and what is weighing on her. LCSWA scheduled an appointment to meet with pt to help reduce her attacks

## 2022-09-02 NOTE — Telephone Encounter (Signed)
Called pt today to scheduled her appt. With me. Pt didn't answer, left a message

## 2022-09-06 MED ORDER — ESCITALOPRAM OXALATE 20 MG PO TABS: 20.0000 mg | ORAL_TABLET | Freq: Every day | ORAL | 1 refills | Status: DC

## 2022-09-09 DIAGNOSIS — M9914 Subluxation complex (vertebral) of sacral region: Secondary | ICD-10-CM | POA: Diagnosis not present

## 2022-09-09 DIAGNOSIS — M5116 Intervertebral disc disorders with radiculopathy, lumbar region: Secondary | ICD-10-CM | POA: Diagnosis not present

## 2022-09-09 DIAGNOSIS — M9903 Segmental and somatic dysfunction of lumbar region: Secondary | ICD-10-CM | POA: Diagnosis not present

## 2022-09-09 DIAGNOSIS — M9905 Segmental and somatic dysfunction of pelvic region: Secondary | ICD-10-CM | POA: Diagnosis not present

## 2022-09-11 ENCOUNTER — Ambulatory Visit (INDEPENDENT_AMBULATORY_CARE_PROVIDER_SITE_OTHER): Payer: 59 | Admitting: Primary Care

## 2022-09-13 DIAGNOSIS — G4733 Obstructive sleep apnea (adult) (pediatric): Secondary | ICD-10-CM | POA: Diagnosis not present

## 2022-09-17 ENCOUNTER — Institutional Professional Consult (permissible substitution) (INDEPENDENT_AMBULATORY_CARE_PROVIDER_SITE_OTHER): Payer: Self-pay | Admitting: Licensed Clinical Social Worker

## 2022-09-17 ENCOUNTER — Ambulatory Visit (INDEPENDENT_AMBULATORY_CARE_PROVIDER_SITE_OTHER): Payer: 59 | Admitting: Primary Care

## 2022-09-18 ENCOUNTER — Encounter (INDEPENDENT_AMBULATORY_CARE_PROVIDER_SITE_OTHER): Payer: Self-pay | Admitting: Primary Care

## 2022-09-18 ENCOUNTER — Ambulatory Visit (INDEPENDENT_AMBULATORY_CARE_PROVIDER_SITE_OTHER): Payer: 59 | Admitting: Primary Care

## 2022-09-18 VITALS — BP 129/86 | HR 71 | Resp 16 | Wt 313.6 lb

## 2022-09-18 DIAGNOSIS — F418 Other specified anxiety disorders: Secondary | ICD-10-CM

## 2022-09-18 DIAGNOSIS — R4581 Low self-esteem: Secondary | ICD-10-CM | POA: Diagnosis not present

## 2022-09-20 NOTE — Progress Notes (Signed)
    [facility]  Subjective:     [name] is a 52 y.o. female who presents for follow up of depression. Current symptoms include depressed mood, difficulty concentrating, fatigue, hopelessness, and insomnia. Symptoms have been gradually worsening since that time. Patient denies recurrent thoughts of death, suicidal attempt, and suicidal thoughts with specific plan. Previous treatment includes: individual therapy and medication. She complains of the following side effects from the treatment: none. Since last visit her daughter lost her job and the car was reposed no transportation. She is also concerned about her weight and affecting her self esteem.   The following portions of the patient's history were reviewed and updated as appropriate: allergies, current medications, past family history, past medical history, past social history, past surgical history, and problem list.  Review of Systems Pertinent items noted in HPI and remainder of comprehensive ROS otherwise negative.    Objective:    Blood Pressure 129/86   Pulse 71   Respiration 16   Weight (Abnormal) 313 lb 9.6 oz (142.2 kg)   Oxygen Saturation 98%   Body Mass Index 49.12 kg/m   General: No apparent distress. Morbid obese  Eyes: Extraocular eye movements intact, pupils equal and round. Neck: Supple, trachea midline. Thyroid: No enlargement, mobile without fixation, no tenderness. Cardiovascular: Regular rhythm and rate, no murmur, normal radial pulses. Respiratory: Normal respiratory effort, clear to auscultation. Gastrointestinal: Normal pitch active bowel sounds, nontender abdomen without distention or appreciable hepatomegaly Skin: Appropriate warmth, no visible rash. Mental status: Alert, conversant, speech clear, thought logical, appropriate mood and affect, no hallucinations or delusions evident. Hematologic/lymphatic: No cervical adenopathy, no visible ecchymoses.  Assessment:  Catherine [name] was seen  today for anxiety, depression and weight management screening.  Diagnoses and all orders for this visit:  Depression with anxiety Currently on lexapro and buspar feels helps some constantly worrying about something.  She would benefit from a psychiatrist but prefers to see her PCP  Morbid obesity (HCC) 2/2 Low self-esteem Discussed diet and exercise for person with BMI >25. Instructed: You must burn more calories than you eat. Losing 5 percent of your body weight should be considered a success. In the longer term, losing more than 15 percent of your body weight and staying at this weight is an extremely good result. However, keep in mind that even losing 5 percent of your body weight leads to important health benefits, so try not to get discouraged if you're not able to lose more than this.  This note has been created with Education officer, environmental. Any transcriptional errors are unintentional.   Catherine Sessions, NP 09/27/2022, 6:58 PM

## 2022-09-22 DIAGNOSIS — M9905 Segmental and somatic dysfunction of pelvic region: Secondary | ICD-10-CM | POA: Diagnosis not present

## 2022-09-22 DIAGNOSIS — M9903 Segmental and somatic dysfunction of lumbar region: Secondary | ICD-10-CM | POA: Diagnosis not present

## 2022-09-22 DIAGNOSIS — M9914 Subluxation complex (vertebral) of sacral region: Secondary | ICD-10-CM | POA: Diagnosis not present

## 2022-09-22 DIAGNOSIS — M5116 Intervertebral disc disorders with radiculopathy, lumbar region: Secondary | ICD-10-CM | POA: Diagnosis not present

## 2022-09-28 ENCOUNTER — Telehealth (INDEPENDENT_AMBULATORY_CARE_PROVIDER_SITE_OTHER): Payer: Self-pay | Admitting: Licensed Clinical Social Worker

## 2022-09-28 NOTE — Telephone Encounter (Signed)
LCSWA called patient today to introduce herself and to assess patients' mental health needs. Patient did not answer the phone. LCSWA was able to leave a brief message with the patient asking them to return the call. Patient was referred by PCP for anxiety and depression.   

## 2022-10-01 ENCOUNTER — Ambulatory Visit (INDEPENDENT_AMBULATORY_CARE_PROVIDER_SITE_OTHER): Payer: 59 | Admitting: Primary Care

## 2022-10-01 ENCOUNTER — Encounter (INDEPENDENT_AMBULATORY_CARE_PROVIDER_SITE_OTHER): Payer: Self-pay | Admitting: Primary Care

## 2022-10-01 VITALS — BP 122/85 | HR 78 | Resp 16 | Wt 314.8 lb

## 2022-10-01 DIAGNOSIS — E119 Type 2 diabetes mellitus without complications: Secondary | ICD-10-CM

## 2022-10-01 DIAGNOSIS — R4581 Low self-esteem: Secondary | ICD-10-CM

## 2022-10-01 DIAGNOSIS — E559 Vitamin D deficiency, unspecified: Secondary | ICD-10-CM

## 2022-10-01 LAB — POCT GLYCOSYLATED HEMOGLOBIN (HGB A1C): HbA1c, POC (controlled diabetic range): 6.5 % (ref 0.0–7.0)

## 2022-10-01 NOTE — Progress Notes (Signed)
Subjective:  Patient ID: Catherine [name], female    DOB: February 06, 1971  Age: 52 y.o. MRN: [id number]  CC: Diabetes   HPI CLOEY HESSMAN presents for Follow-up of diabetes. Patient does not check blood sugar at home. She is becoming more depressed with excercing and unable to loss weight and monitoring her carb intake affecting her self esteem and body image  Compliant with meds - Yes Checking CBGs? No  Fasting avg -   Postprandial average -  Exercising regularly? - No Watching carbohydrate intake? - Yes Neuropathy ? - No Hypoglycemic events - No  - Recovers with :   Pertinent ROS:  Polyuria - No Polydipsia - No Vision problems - No  Medications as noted below. Taking them regularly without complication/adverse reaction being reported today.   History Sheniya has a past medical history of Allergy, Anemia, Anxiety, Blood transfusion without reported diagnosis, Bronchitis, Depression, Diabetes mellitus without complication (HCC), Hyperlipidemia, Hypertension, and Sleep apnea.   She has a past surgical history that includes Myomectomy (2005); Bunionectomy (2012); Cesarean section; LAVH (2009); Wisdom tooth extraction; Laparoscopic vaginal hysterectomy (2007); Colonoscopy with propofol (N/A, 01/23/2021); polypectomy (01/23/2021); and Abdominal hysterectomy.   Her family history includes Asthma in her daughter and mother; Colon polyps in her mother; Heart attack in her father; Hypertension in her father, mother, and sister.She reports that she has quit smoking. Her smoking use included cigarettes. She smoked an average of .5 packs per day. She has never used smokeless tobacco. She reports current alcohol use. She reports that she does not use drugs.  Current Outpatient Medications on File Prior to Visit  Medication Sig Dispense Refill   acetaminophen (TYLENOL) 500 MG tablet Take 1,000 mg by mouth every 6 (six) hours as needed for moderate pain or headache.     albuterol (VENTOLIN HFA) 108 (90  Base) MCG/ACT inhaler INHALE 2 PUFFS INTO THE LUNGS 4 TIMES A DAY 18 each 1   beclomethasone (QVAR REDIHALER) 80 MCG/ACT inhaler Inhale 1 puff into the lungs 2 (two) times daily. 1 each 2   busPIRone (BUSPAR) 7.5 MG tablet Take 1 tablet (7.5 mg total) by mouth 3 (three) times daily. 270 tablet 1   escitalopram (LEXAPRO) 20 MG tablet Take 1 tablet (20 mg total) by mouth daily. 90 tablet 1   fluticasone (FLONASE) 50 MCG/ACT nasal spray Place 1 spray into both nostrils daily as needed for allergies. 11.1 mL 3   glimepiride (AMARYL) 2 MG tablet Take 1 tablet (2 mg total) by mouth daily before breakfast. 30 tablet 3   hydrochlorothiazide (HYDRODIURIL) 25 MG tablet TAKE 1 TABLET BY MOUTH EVERY DAY IN THE MORNING 90 tablet 1   losartan (COZAAR) 50 MG tablet TAKE 2 TABLETS BY MOUTH EVERY DAY 180 tablet 1   metroNIDAZOLE (FLAGYL) 500 MG tablet Take 1 tablet (500 mg total) by mouth 2 (two) times daily. 14 tablet 0   rosuvastatin (CRESTOR) 10 MG tablet Take 1 tablet (10 mg total) by mouth daily. 90 tablet 1   Sodium Sulfate-Mag Sulfate-KCl (SUTAB) [phone] MG TABS Take 1 kit by mouth as directed. MANUFACTURER CODES!! BIN: [id number] PCN: CN GROUP: [id number] MEMBER ID: [id number];[employer] AS SECONDARY INSURANCE ;NO PRIOR AUTHORIZATION 24 tablet 0   No current facility-administered medications on file prior to visit.   ROS Comprehensive ROS Pertinent positive and negative noted in HPI    Objective:  Blood Pressure 122/85   Pulse 78   Respiration 16   Weight (Abnormal) 314 lb 12.8 oz (142.8 kg)  Oxygen Saturation 99%   Body Mass Index 49.30 kg/m   BP Readings from Last 3 Encounters:  10/01/22 122/85  09/18/22 129/86  09/01/22 127/83    Wt Readings from Last 3 Encounters:  10/01/22 (Abnormal) 314 lb 12.8 oz (142.8 kg)  09/18/22 (Abnormal) 313 lb 9.6 oz (142.2 kg)  09/01/22 (Abnormal) 315 lb 6.4 oz (143.1 kg)    Physical Exam Vitals reviewed.  Constitutional:      Appearance: She is obese.   HENT:     Head: Normocephalic.     Right Ear: External ear normal.     Left Ear: External ear normal.     Nose: Nose normal.  Eyes:     Extraocular Movements: Extraocular movements intact.  Cardiovascular:     Rate and Rhythm: Normal rate and regular rhythm.  Pulmonary:     Effort: Pulmonary effort is normal.     Breath sounds: Normal breath sounds.  Abdominal:     General: Bowel sounds are normal. There is distension.     Palpations: Abdomen is soft.  Musculoskeletal:        General: Normal range of motion.     Cervical back: Normal range of motion and neck supple.  Skin:    General: Skin is warm and dry.  Neurological:     Mental Status: She is alert and oriented to person, place, and time.  Psychiatric:        Mood and Affect: Mood normal.        Behavior: Behavior normal.   Lab Results  Component Value Date   HGBA1C 6.5 10/01/2022   HGBA1C 6.6 07/03/2022   HGBA1C 6.2 01/02/2022    Lab Results  Component Value Date   WBC 8.7 08/25/2021   HGB 13.8 08/25/2021   HCT 44.2 08/25/2021   PLT 265 08/25/2021   GLUCOSE 98 01/02/2022   CHOL 189 01/02/2022   TRIG 72 01/02/2022   HDL 58 01/02/2022   LDLCALC 118 (H) 01/02/2022   ALT 18 01/02/2022   AST 17 01/02/2022   NA 141 01/02/2022   K 4.3 01/02/2022   CL 99 01/02/2022   CREATININE 0.92 01/02/2022   BUN 15 01/02/2022   CO2 27 01/02/2022   TSH 1.440 01/16/2021   HGBA1C 6.5 10/01/2022     Assessment & Plan:  Aashni was seen today for diabetes.  Diagnoses and all orders for this visit:  Type 2 diabetes mellitus without complication, without long-term current use of insulin (HCC) - educated on lifestyle modifications, including but not limited to diet choices and adding exercise to daily routine.   -     POCT glycosylated hemoglobin (Hb A1C) 6.5  -     CMP14+EGFR -     Lipid Panel  Vitamin D deficiency -     Vitamin D, 25-hydroxy  Low self-esteem 2/2 Morbid obesity (HCC) Refer to LCS  Discussed diet and  exercise You must burn more calories than you eat. Losing 5 percent of your body weight should be considered a success. In the longer term, losing more than 15 percent of your body weight and staying at this weight is an extremely good result. However, keep in mind that even losing 5 percent of your body weight leads to important health benefits, so try not to get discouraged if you're not able to lose more than this. Will recheck weight in 3-6 months.  On Buspar    I am having Carely A. Starace maintain her Sutab, acetaminophen, metroNIDAZOLE, fluticasone, albuterol,  rosuvastatin, busPIRone, glimepiride, losartan, hydrochlorothiazide, Qvar RediHaler, and escitalopram.  No orders of the defined types were placed in this encounter.    Follow-up:   No follow-ups on file.  The above assessment and management plan was discussed with the patient. The patient verbalized understanding of and has agreed to the management plan. Patient is aware to call the clinic if symptoms fail to improve or worsen. Patient is aware when to return to the clinic for a follow-up visit. Patient educated on when it is appropriate to go to the emergency department.   Gwinda Passe, NP-C

## 2022-10-02 ENCOUNTER — Other Ambulatory Visit (INDEPENDENT_AMBULATORY_CARE_PROVIDER_SITE_OTHER): Payer: Self-pay | Admitting: Primary Care

## 2022-10-02 LAB — CMP14+EGFR
ALT: 15 IU/L (ref 0–32)
AST: 16 IU/L (ref 0–40)
Albumin/Globulin Ratio: 1.9
Albumin: 4.7 g/dL (ref 3.8–4.9)
Alkaline Phosphatase: 78 IU/L (ref 44–121)
BUN/Creatinine Ratio: 22 (ref 9–23)
BUN: 19 mg/dL (ref 6–24)
Bilirubin Total: 0.5 mg/dL (ref 0.0–1.2)
CO2: 27 mmol/L (ref 20–29)
Calcium: 9.3 mg/dL (ref 8.7–10.2)
Chloride: 102 mmol/L (ref 96–106)
Creatinine, Ser: 0.86 mg/dL (ref 0.57–1.00)
Globulin, Total: 2.5 g/dL (ref 1.5–4.5)
Glucose: 121 mg/dL — ABNORMAL HIGH (ref 70–99)
Potassium: 4.5 mmol/L (ref 3.5–5.2)
Sodium: 143 mmol/L (ref 134–144)
Total Protein: 7.2 g/dL (ref 6.0–8.5)
eGFR: 81 mL/min/{1.73_m2} (ref >59)

## 2022-10-02 LAB — LIPID PANEL
Chol/HDL Ratio: 2.5 ratio (ref 0.0–4.4)
Cholesterol, Total: 140 mg/dL (ref 100–199)
HDL: 56 mg/dL (ref >39)
LDL Chol Calc (NIH): 71 mg/dL (ref 0–99)
Triglycerides: 64 mg/dL (ref 0–149)
VLDL Cholesterol Cal: 13 mg/dL (ref 5–40)

## 2022-10-02 LAB — VITAMIN D 25 HYDROXY (VIT D DEFICIENCY, FRACTURES): Vit D, 25-Hydroxy: 6.3 ng/mL — ABNORMAL LOW (ref 30.0–100.0)

## 2022-10-02 MED ORDER — ERGOCALCIFEROL 1.25 MG (50000 UT) PO CAPS: 50000.0000 [IU] | ORAL_CAPSULE | ORAL | 0 refills | Status: DC

## 2022-10-06 ENCOUNTER — Encounter (INDEPENDENT_AMBULATORY_CARE_PROVIDER_SITE_OTHER): Payer: Self-pay | Admitting: Primary Care

## 2022-10-08 ENCOUNTER — Telehealth: Payer: Self-pay | Admitting: Primary Care

## 2022-10-08 NOTE — Telephone Encounter (Signed)
Will forward to provider  

## 2022-10-08 NOTE — Telephone Encounter (Signed)
Copied from CRM [id number]. Topic: General - Inquiry >> Oct 08, 2022  1:41 PM Haroldine Laws wrote: Reason for CRM: pt says that her daughter is dropping off the FMLA paperwork today and she needs Marcelino Duster to call her asap  CB#  [phone]

## 2022-10-09 NOTE — Telephone Encounter (Signed)
Contacted pt and made aware that paperwork is ready for pick up. Pt states she or her or daughter will come by to pick.

## 2022-10-14 DIAGNOSIS — G4733 Obstructive sleep apnea (adult) (pediatric): Secondary | ICD-10-CM | POA: Diagnosis not present

## 2022-10-15 ENCOUNTER — Encounter (INDEPENDENT_AMBULATORY_CARE_PROVIDER_SITE_OTHER): Payer: Self-pay | Admitting: Primary Care

## 2022-10-19 ENCOUNTER — Ambulatory Visit (INDEPENDENT_AMBULATORY_CARE_PROVIDER_SITE_OTHER): Payer: Self-pay | Admitting: *Deleted

## 2022-10-19 NOTE — Telephone Encounter (Signed)
Addendum:. Recommended urgent crisis center and gave # and to text "[phone]" if worsening sx of harming self noted.

## 2022-10-19 NOTE — Telephone Encounter (Addendum)
Chief Complaint: requesting appt for worsening anxiety and depression, requesting paperwork be completed prior to 10/25/22 for job Symptoms: anxious at times and then becomes depressed soon after. With anxiety moments chest pain with heart palpitations at times, feels "weird".feels hot at times no sweating no difficulty breathing  Not now. Becomes depressed and does not want to be around others. Has issues at work and attendance in final warning. Expressed thoughts of harming self no plan reported. Brain fog during the day, trouble sleeping, hard to go to work but does it Frequency: started last week and worsening over weekend  Pertinent Negatives: Patient denies chest pain now no difficulty breathing denies wanting to harm self now  Disposition: [] ED /[] Urgent Care (no appt availability in office) / [x] Appointment(In office/virtual)/ []  [facility]/ [] Home Care/ [] Refused Recommended Disposition /[] [facility]/ []  Follow-up with PCP Additional Notes:   Earliest appt 10/29/22. Requesting paperwork to be brought to office , requesting to be completed by 10/25/22. Please call patient back or respond to My Chart message if she can bring to office. Requesting additional assist with referral to psych. Due to she can not get off work on Thursday to see therapist. Please advise      Reason for Disposition  MODERATE anxiety (e.g., persistent or frequent anxiety symptoms; interferes with sleep, school, or work)  Answer Assessment - Initial Assessment Questions 1. CONCERN: "Did anything happen that prompted you to call today?"      More anxiety and depression over the weekend and last  2. ANXIETY SYMPTOMS: "Can you describe how you (your loved one; patient) have been feeling?" (e.g., tense, restless, panicky, anxious, keyed up, overwhelmed, sense of impending doom).      Anxious does not feel like doing anything everything upset me and feels ok now , job is an issue on attendance  3.  ONSET: "How long have you been feeling this way?" (e.g., hours, days, weeks)     Since last week  4. SEVERITY: "How would you rate the level of anxiety?" (e.g., 0 - 10; or mild, moderate, severe).     8-9 5. FUNCTIONAL IMPAIRMENT: "How have these feelings affected your ability to do daily activities?" "Have you had more difficulty than usual doing your normal daily activities?" (e.g., getting better, same, worse; self-care, school, work, interactions)     Have to make self get up has to work . Now on disciplinary action at work ,  don't want to be around others  6. HISTORY: "Have you felt this way before?" "Have you ever been diagnosed with an anxiety problem in the past?" (e.g., generalized anxiety disorder, panic attacks, PTSD). If Yes, ask: "How was this problem treated?" (e.g., medicines, counseling, etc.)     Yes does take medication  7. RISK OF HARM - SUICIDAL IDEATION: "Do you ever have thoughts of hurting or killing yourself?" If Yes, ask:  "Do you have these feelings now?" "Do you have a plan on how you would do this?"     Has had thoughts of hurting self, no plan reported. Reports only feelings  8. TREATMENT:  "What has been done so far to treat this anxiety?" (e.g., medicines, relaxation strategies). "What has helped?"     Only medications prescribed by PCP 9. TREATMENT - THERAPIST: "Do you have a counselor or therapist? Name?"     Yes but never got to see them due to her work schedule 10. POTENTIAL TRIGGERS: "Do you drink caffeinated beverages (e.g., coffee, colas, teas),  and how much daily?" "Do you drink alcohol or use any drugs?" "Have you started any new medicines recently?"       na 11. PATIENT SUPPORT: "Who is with you now?" "Who do you live with?" "Do you have family or friends who you can talk to?"        Family  12. OTHER SYMPTOMS: "Do you have any other symptoms?" (e.g., feeling depressed, trouble concentrating, trouble sleeping, trouble breathing, palpitations or fast  heartbeat, chest pain, sweating, nausea, or diarrhea)       Trouble sleeping , brain fog throughout day , chest pain at times with like a hot flashes with heart feeling weird at times. No sx now  13. PREGNANCY: "Is there any chance you are pregnant?" "When was your last menstrual period?"       na  Protocols used: Anxiety and Panic Attack-A-AH

## 2022-10-21 NOTE — Telephone Encounter (Signed)
Returned pt call. Pt states the form provider filled out was filled out incorrectly. Pt states her daughter will drop off another form on Friday and hopefully provider will be able to fill out because it is due Monday

## 2022-10-21 NOTE — Telephone Encounter (Signed)
Pt is requesting a call back from provider.

## 2022-10-23 NOTE — Telephone Encounter (Signed)
Spoke with patient.

## 2022-10-24 ENCOUNTER — Other Ambulatory Visit (INDEPENDENT_AMBULATORY_CARE_PROVIDER_SITE_OTHER): Payer: Self-pay | Admitting: Primary Care

## 2022-10-26 ENCOUNTER — Telehealth (INDEPENDENT_AMBULATORY_CARE_PROVIDER_SITE_OTHER): Payer: Self-pay | Admitting: Licensed Clinical Social Worker

## 2022-10-26 ENCOUNTER — Telehealth: Payer: Self-pay | Admitting: Primary Care

## 2022-10-26 NOTE — Telephone Encounter (Signed)
Pt is experiencing Low self-esteem and needs a follow up appt with LCSWA, pt was able to schedule a follow up.

## 2022-10-26 NOTE — Telephone Encounter (Signed)
Requested medication (s) are due for refill today:   Provider to review  Requested medication (s) are on the active medication list:   Yes  Future visit scheduled:   Yes in 2 days   Last ordered: 10/02/2022 #8, 0 refills  Returned because a 90 day supply is being requested.   It's a non delegated refill    Requested Prescriptions  Pending Prescriptions Disp Refills   Vitamin D, Ergocalciferol, (DRISDOL) 1.25 MG (50000 UNIT) CAPS capsule [Pharmacy Med Name: VITAMIN D2 1.25MG (50,000 UNIT)] 12 capsule 1    Sig: TAKE 1 CAPSULE BY MOUTH ONE TIME PER WEEK     Endocrinology:  Vitamins - Vitamin D Supplementation 2 Failed - 10/24/2022 11:31 AM      Failed - Manual Review: Route requests for 50,000 IU strength to the provider      Failed - Vitamin D in normal range and within 360 days    Vit D, 25-Hydroxy  Date Value Ref Range Status  10/01/2022 6.3 (L) 30.0 - 100.0 ng/mL Final    Comment:    Vitamin D deficiency has been defined by the Institute of Medicine and an Endocrine Society practice guideline as a level of serum 25-OH vitamin D less than 20 ng/mL (1,2). The Endocrine Society went on to further define vitamin D insufficiency as a level between 21 and 29 ng/mL (2). 1. IOM (Institute of Medicine). 2010. Dietary reference    intakes for calcium and D. [place]: The    [employer]. 2. Holick MF, Binkley [place], Bischoff-Ferrari HA, et al.    Evaluation, treatment, and prevention of vitamin D    deficiency: an Endocrine Society clinical practice    guideline. JCEM. 2011 Jul; 96(7):1911-30.          Passed - Ca in normal range and within 360 days    Calcium  Date Value Ref Range Status  10/01/2022 9.3 8.7 - 10.2 mg/dL Final   Calcium, Ion  Date Value Ref Range Status  03/01/2013 1.05 (L) 1.12 - 1.23 mmol/L Final         Passed - Valid encounter within last 12 months    Recent Outpatient Visits           3 weeks ago Type 2 diabetes mellitus without  complication, without long-term current use of insulin (HCC)   [place] [facility] Grayce Sessions, NP   1 month ago Depression with anxiety   [place] [facility] Grayce Sessions, NP   1 month ago Depression with anxiety   [place] [facility] Grayce Sessions, NP   2 months ago Depression with anxiety   [place] [facility] Grayce Sessions, NP   3 months ago Type 2 diabetes mellitus without complication, without long-term current use of insulin [facility])   [place] [facility] Grayce Sessions, NP       Future Appointments             In 3 days Randa Evens Kinnie Scales, NP [place] [facility]   In 2 months Randa Evens, Kinnie Scales, NP  [facility]

## 2022-10-26 NOTE — Telephone Encounter (Signed)
Pt is requesting that FMLA be faxed. Fmla has been faxed

## 2022-10-26 NOTE — Telephone Encounter (Signed)
Copied from CRM [id number]. Topic: General - Other >> Oct 26, 2022 11:08 AM Macon Large wrote: Reason for CRM: Pt called for an update on her FMLA paperwork be faxed to her job. Pt requests that Lawana Pai return her call at [phone]

## 2022-10-28 NOTE — Telephone Encounter (Signed)
This has been taken care of thank you

## 2022-10-29 ENCOUNTER — Encounter (INDEPENDENT_AMBULATORY_CARE_PROVIDER_SITE_OTHER): Payer: Self-pay | Admitting: Primary Care

## 2022-10-29 ENCOUNTER — Ambulatory Visit (INDEPENDENT_AMBULATORY_CARE_PROVIDER_SITE_OTHER): Payer: 59 | Admitting: Primary Care

## 2022-10-29 VITALS — BP 123/86 | HR 81 | Resp 16 | Wt 315.4 lb

## 2022-10-29 DIAGNOSIS — F418 Other specified anxiety disorders: Secondary | ICD-10-CM | POA: Diagnosis not present

## 2022-10-29 DIAGNOSIS — E559 Vitamin D deficiency, unspecified: Secondary | ICD-10-CM | POA: Diagnosis not present

## 2022-10-29 MED ORDER — ERGOCALCIFEROL 1.25 MG (50000 UT) PO CAPS: 50000.0000 [IU] | ORAL_CAPSULE | ORAL | 0 refills | Status: DC

## 2022-11-03 ENCOUNTER — Telehealth (INDEPENDENT_AMBULATORY_CARE_PROVIDER_SITE_OTHER): Payer: Self-pay

## 2022-11-03 NOTE — Telephone Encounter (Signed)
Copied from CRM [id number]. Topic: Appointment Scheduling - Scheduling Inquiry for Clinic >> Nov 03, 2022  2:17 PM Haroldine Laws wrote: Reason for CRM: pt would like Courtney to call her in regards to the appt next week  CB [phone]

## 2022-11-04 NOTE — Progress Notes (Signed)
    [facility]  Subjective:     [name] is a 52 y.o. female who presents for follow up of depression and anxiety . Current symptoms include depressed mood, difficulty concentrating, fatigue, hopelessness, and insomnia. She feels she is having panic attacks.. Patient denies recurrent thoughts of death, suicidal attempt, and suicidal thoughts with specific plan. She is being followed by CSW. . The following portions of the patient's history were reviewed and updated as appropriate: allergies, current medications, past family history, past medical history, past social history, past surgical history, and problem list.  Review of Systems Pertinent items noted in HPI and remainder of comprehensive ROS otherwise negative.    Objective:    Blood Pressure 123/86   Pulse 81   Respiration 16   Weight (Abnormal) 315 lb 6.4 oz (143.1 kg)   Oxygen Saturation 99%   Body Mass Index 49.40 kg/m   General: No apparent distress. Morbid obese  Eyes: Extraocular eye movements intact, pupils equal and round. Neck: Supple, trachea midline. Cardiovascular: Regular rhythm and rate, no murmur, normal radial pulses. Respiratory: Normal respiratory effort, clear to auscultation. Gastrointestinal: Normal pitch active bowel sounds, nontender abdomen Skin: Appropriate warmth, no visible rash. Mental status: Alert, conversant, speech clear, thought logical, appropriate mood and affect, no hallucinations or delusions evident. Hematologic/lymphatic: No cervical adenopathy, no visible ecchymoses.  Assessment:  Catherine [name] was seen today for anxiety, depression and weight management screening.  Diagnoses and all orders for this visit:  Depression with anxiety Currently on lexapro and buspar  panic attacks interfering with working.   Vitamin D deficiency  Vitamin D is needed to make and keep bones strong. The patient will need to take a prescription strength .   This note has been created with  Education officer, environmental. Any transcriptional errors are unintentional.   Grayce Sessions

## 2022-11-05 ENCOUNTER — Ambulatory Visit (INDEPENDENT_AMBULATORY_CARE_PROVIDER_SITE_OTHER): Payer: 59 | Admitting: Licensed Clinical Social Worker

## 2022-11-05 DIAGNOSIS — F411 Generalized anxiety disorder: Secondary | ICD-10-CM | POA: Diagnosis not present

## 2022-11-05 DIAGNOSIS — F418 Other specified anxiety disorders: Secondary | ICD-10-CM | POA: Diagnosis not present

## 2022-11-05 DIAGNOSIS — R4581 Low self-esteem: Secondary | ICD-10-CM

## 2022-11-05 DIAGNOSIS — Z599 Problem related to housing and economic circumstances, unspecified: Secondary | ICD-10-CM

## 2022-11-09 NOTE — BH Specialist Note (Signed)
Integrated Behavioral Health via Telemedicine Visit  11/05/2022 Catherine [name] [id number]  Number of Integrated Behavioral Health Clinician visits: 1- Initial Visit  Session Start time: 1110   Session End time: 1136  Total time in minutes: 26   Referring Provider: PCP  Patient/Family location: Home [facility] Provider location: [facility] All persons participating in visit: Pt and therapist  Types of Service: Individual psychotherapy, Video visit, and Introduction only  I connected with [name] and/or via  Telephone or Video Enabled Telemedicine Application  (Video is Caregility application) and verified that I am speaking with the correct person using two identifiers. Discussed confidentiality: Yes   I discussed the limitations of telemedicine and the availability of in person appointments.  Discussed there is a possibility of technology failure and discussed alternative modes of communication if that failure occurs.  I discussed that engaging in this telemedicine visit, they consent to the provision of behavioral healthcare and the services will be billed under their insurance.  Patient and/or legal guardian expressed understanding and consented to Telemedicine visit: Yes   Presenting Concerns: Patient and/or family reports the following symptoms/concerns: concerns about her depression, self confidence, crying often, poor diet and anxious Duration of problem: past year; Severity of problem: moderate  Patient and/or Family's Strengths/Protective Factors: Concrete supports in place (healthy food, safe environments, etc.)  Goals Addressed: Patient will:  Reduce symptoms of: anxiety, depression, and stress   Increase knowledge and/or ability of: coping skills and healthy habits   Demonstrate ability to: Increase healthy adjustment to current life circumstances and Increase motivation to adhere to plan of care  Progress towards Goals: Ongoing  Interventions: Interventions  utilized:  Supportive Counseling and Supportive Reflection Standardized Assessments completed: Not Needed Helping a patient develop healthier eating habits involves not only providing nutritional guidance but also supporting them with coping skills to manage cravings, emotional eating, and the challenges of changing dietary patterns. Here are some coping skills you can focus on as a clinical therapist:  Healthy Substitutions: Help the patient find healthier alternatives to their favorite comfort foods. This could involve exploring new recipes, discovering nutritious snacks, or learning to cook healthier versions of their favorite meals.  Goal Setting and Planning: Assist the patient in setting realistic and achievable goals related to their eating habits. Break down larger goals into smaller, manageable steps and create a plan for how they will achieve these goals.  Coping with Setbacks: Help the patient understand that setbacks are a normal part of behavior change. Teach them how to bounce back from setbacks without feeling discouraged, emphasizing the importance of self-compassion and persistence.  Healthy Distractions: Suggest alternative activities that can distract the patient from cravings or emotional eating episodes. This could include hobbies, exercise, or engaging in relaxation techniques.  Positive Reinforcement: Celebrate the patient's successes, no matter how small. Positive reinforcement can boost their confidence and motivation to continue making healthy choices.  Patient and/or Family Response: Patient shared that lately she has been feeling really depressed, very tearful and not liking her self. Pt shared that her hair is thinning. And she has a poor appetite. Pt is receptive to learning some coping skills.  Assessment: Patient currently experiencing concerns about her depression, self confidence, crying often, poor diet and anxious.   Patient may benefit from ongoing services  from [facility] and [facility].  Plan: Follow up with behavioral health clinician on : 4 weeks Behavioral recommendations: diet change, psychiatrist, medication management and therapy  Referral(s): Integrated [employer] (In Clinic)  and Psychiatrist  I discussed the assessment and treatment plan with the patient and/or parent/guardian. They were provided an opportunity to ask questions and all were answered. They agreed with the plan and demonstrated an understanding of the instructions.   They were advised to call back or seek an in-person evaluation if the symptoms worsen or if the condition fails to improve as anticipated.  [name], LCSWA

## 2022-11-13 DIAGNOSIS — G4733 Obstructive sleep apnea (adult) (pediatric): Secondary | ICD-10-CM | POA: Diagnosis not present

## 2022-11-16 ENCOUNTER — Encounter (INDEPENDENT_AMBULATORY_CARE_PROVIDER_SITE_OTHER): Payer: Self-pay

## 2022-11-16 ENCOUNTER — Ambulatory Visit (INDEPENDENT_AMBULATORY_CARE_PROVIDER_SITE_OTHER): Payer: 59

## 2022-11-18 ENCOUNTER — Ambulatory Visit (INDEPENDENT_AMBULATORY_CARE_PROVIDER_SITE_OTHER): Payer: 59

## 2022-11-18 VITALS — BP 132/87

## 2022-11-18 DIAGNOSIS — Z0130 Encounter for examination of blood pressure without abnormal findings: Secondary | ICD-10-CM

## 2022-11-20 ENCOUNTER — Telehealth: Payer: Self-pay | Admitting: Primary Care

## 2022-11-20 NOTE — Telephone Encounter (Signed)
Tried returning call was unable to get someone on the phone will try again on Monday

## 2022-11-20 NOTE — Telephone Encounter (Signed)
Copied from CRM [id number]. Topic: General - Other >> Nov 20, 2022  8:18 AM Lennox Pippins wrote: Lesly Rubenstein with [employer] called and stated [employer] needs a call back regarding patient's appointments, hospitalizations, diagnoses. Please advise.   [employer] [phone] REF # [id number]

## 2022-12-04 NOTE — Telephone Encounter (Signed)
Returned pt call pt didn't answer lvm  

## 2022-12-04 NOTE — Telephone Encounter (Signed)
Pt returned call and made aware that all paperwork has be completed and will be faxed. Pt asked if her release to return to work note be emailed to her. Made pt aware that I will email it to her and I will fax everything else. Pt states she appreciates it

## 2022-12-04 NOTE — Telephone Encounter (Signed)
Pt calling today b/c disability has not gotten her paperwork.  Pt states she needs to get paid. Pt states she wants a call back today [phone].  Please advise.

## 2022-12-10 ENCOUNTER — Ambulatory Visit (INDEPENDENT_AMBULATORY_CARE_PROVIDER_SITE_OTHER): Payer: 59 | Admitting: Licensed Clinical Social Worker

## 2022-12-10 DIAGNOSIS — F411 Generalized anxiety disorder: Secondary | ICD-10-CM

## 2022-12-14 DIAGNOSIS — G4733 Obstructive sleep apnea (adult) (pediatric): Secondary | ICD-10-CM | POA: Diagnosis not present

## 2022-12-26 ENCOUNTER — Other Ambulatory Visit (INDEPENDENT_AMBULATORY_CARE_PROVIDER_SITE_OTHER): Payer: Self-pay | Admitting: Primary Care

## 2022-12-26 DIAGNOSIS — I10 Essential (primary) hypertension: Secondary | ICD-10-CM

## 2022-12-31 NOTE — BH Specialist Note (Signed)
Integrated Behavioral Health via Telemedicine Visit  12/10/2022 Catherine [name] [id number]  Number of Integrated Behavioral Health Clinician visits: 2- Second Visit  Session Start time: 1100   Session End time: 1135  Total time in minutes: 35   Referring Provider: PCP Patient/Family location: home [facility] Provider location: In office All persons participating in visit: pt, therapist and SW intern Types of Service: Individual psychotherapy and Video visit  I connected with Catherine [name] via  Telephone or Video Enabled Telemedicine Application  (Video is Caregility application) and verified that I am speaking with the correct person using two identifiers. Discussed confidentiality: Yes   I discussed the limitations of telemedicine and the availability of in person appointments.  Discussed there is a possibility of technology failure and discussed alternative modes of communication if that failure occurs.  I discussed that engaging in this telemedicine visit, they consent to the provision of behavioral healthcare and the services will be billed under their insurance.  Patient and/or legal guardian expressed understanding and consented to Telemedicine visit: Yes   Presenting Concerns: Patient and/or family reports the following symptoms/concerns: hair loss, stress, anxious, lack of self confidence. Duration of problem: about a year; Severity of problem: mild  Patient and/or Family's Strengths/Protective Factors: Social connections and Concrete supports in place (healthy food, safe environments, etc.)  Goals Addressed: Patient will:  Reduce symptoms of: agitation, anxiety, and stress   Increase knowledge and/or ability of: coping skills and stress reduction   Demonstrate ability to: Increase healthy adjustment to current life circumstances and Increase adequate support systems for patient/family  Progress towards  Goals: Revised and Ongoing  Interventions: Interventions utilized:  CBT Cognitive Behavioral Therapy Standardized Assessments completed: Not Needed  Patient and/or Family Response: pt was happy to share that her daughter is expecting a new baby. Pt reports that she loves her children and grandchildren. Pt indicated that she was on leave from work for a couple of weeks and she noticed a huge impact in her anxiety and mood. It was observed that pt seemed less stressed than her last visit.  Assessment: Patient currently experiencing is some reduced stress, and excitement about looking for a new job.   Patient may benefit from looking for a new job, having her hormones checked for her hair loss and fatigue.  Plan: Follow up with behavioral health clinician on : 4 weeks Behavioral recommendations:  suggest that pt get her hormones levels checked  Referral(s): Integrated [employer] (In Clinic)  I discussed the assessment and treatment plan with the patient and/or parent/guardian. They were provided an opportunity to ask questions and all were answered. They agreed with the plan and demonstrated an understanding of the instructions.   They were advised to call back or seek an in-person evaluation if the symptoms worsen or if the condition fails to improve as anticipated.  Catherine [name], LCSWA

## 2023-01-04 ENCOUNTER — Telehealth (INDEPENDENT_AMBULATORY_CARE_PROVIDER_SITE_OTHER): Payer: 59 | Admitting: Primary Care

## 2023-01-04 ENCOUNTER — Ambulatory Visit (INDEPENDENT_AMBULATORY_CARE_PROVIDER_SITE_OTHER): Payer: 59 | Admitting: Primary Care

## 2023-01-04 DIAGNOSIS — R002 Palpitations: Secondary | ICD-10-CM

## 2023-01-04 NOTE — Progress Notes (Signed)
[facility]  Virtual Visit  I connected with Catherine [name], on 01/04/2023 at 4:35 PM through an audio and video application and verified that I am speaking with the correct person using two identifiers.   Consent: I discussed the limitations, risks, security and privacy concerns of performing an evaluation and management service by telephone and the availability of in person appointments. I also discussed with the patient that there may be a patient responsible charge related to this service. The patient expressed understanding and agreed to proceed.   Location of Patient: Home  Location of Provider: [facility]   Persons participating in Telemedicine visit: Neita Carp,  NP  History of Present Illness: [name] is a 52 year old female having a palpitations , short of breath no lower extremity edema unable to work short distance without having to rest. Intermittently.   Poor esteem due to weight , more secluded not wanting to go or do anything. Past Medical History:  Diagnosis Date   Allergy    seasonal allergies   Anemia    hx of   Anxiety    on meds   Blood transfusion without reported diagnosis    x 3   Bronchitis    Depression    on meds   Diabetes mellitus without complication (HCC)    on meds   Hyperlipidemia    on meds   Hypertension    on meds   Sleep apnea    no CPAP machine being used at this time (08/19/2020)   Allergies  Allergen Reactions   Metformin And Related Other (See Comments)    Abdominal discomfort.   Latex Rash    Current Outpatient Medications on File Prior to Visit  Medication Sig Dispense Refill   acetaminophen (TYLENOL) 500 MG tablet Take 1,000 mg by mouth every 6 (six) hours as needed for moderate pain or headache.     albuterol (VENTOLIN HFA) 108 (90 Base) MCG/ACT inhaler INHALE 2 PUFFS INTO THE LUNGS 4 TIMES A DAY 18 each 1    beclomethasone (QVAR REDIHALER) 80 MCG/ACT inhaler Inhale 1 puff into the lungs 2 (two) times daily. 1 each 2   busPIRone (BUSPAR) 7.5 MG tablet Take 1 tablet (7.5 mg total) by mouth 3 (three) times daily. 270 tablet 1   ergocalciferol (VITAMIN D2) 1.25 MG (50000 UT) capsule Take 1 capsule (50,000 Units total) by mouth once a week. 8 capsule 0   escitalopram (LEXAPRO) 20 MG tablet Take 1 tablet (20 mg total) by mouth daily. 90 tablet 1   fluticasone (FLONASE) 50 MCG/ACT nasal spray Place 1 spray into both nostrils daily as needed for allergies. 11.1 mL 3   glimepiride (AMARYL) 2 MG tablet Take 1 tablet (2 mg total) by mouth daily before breakfast. 30 tablet 3   hydrochlorothiazide (HYDRODIURIL) 25 MG tablet TAKE 1 TABLET BY MOUTH EVERY DAY IN THE MORNING 90 tablet 1   losartan (COZAAR) 50 MG tablet TAKE 2 TABLETS BY MOUTH EVERY DAY 180 tablet 1   metroNIDAZOLE (FLAGYL) 500 MG tablet Take 1 tablet (500 mg total) by mouth 2 (two) times daily. 14 tablet 0   rosuvastatin (CRESTOR) 10 MG tablet Take 1 tablet (10 mg total) by mouth daily. 90 tablet 1   Sodium Sulfate-Mag Sulfate-KCl (SUTAB) [phone] MG TABS Take 1 kit by mouth as directed. MANUFACTURER CODES!! BIN: [id number] PCN: CN GROUP: [id number] MEMBER ID: [id number];NGE AS SECONDARY INSURANCE ;  NO PRIOR AUTHORIZATION 24 tablet 0   No current facility-administered medications on file prior to visit.    Observations/Objective: No v/s   Assessment and Plan: Diagnoses and all orders for this visit:  Palpitation -     Amb Ref to Medical Weight Management  Class 3 severe obesity with serious comorbidity in adult, unspecified BMI, unspecified obesity type (HCC) -     Amb Ref to Medical Weight Management     Follow Up Instructions:    I discussed the assessment and treatment plan with the patient. The patient was provided an opportunity to ask questions and all were answered. The patient agreed with the plan and demonstrated an understanding  of the instructions.   The patient was advised to call back or seek an in-person evaluation if the symptoms worsen or if the condition fails to improve as anticipated.     I provided 20 minutes total of non-face-to-face time during this encounter including median intraservice time, reviewing previous notes, investigations, ordering medications, medical decision making, coordinating care and patient verbalized understanding at the end of the visit.    This note has been created with Education officer, environmental. Any transcriptional errors are unintentional.   Grayce Sessions, NP 01/04/2023, 4:35 PM

## 2023-01-14 ENCOUNTER — Ambulatory Visit (INDEPENDENT_AMBULATORY_CARE_PROVIDER_SITE_OTHER): Payer: 59 | Admitting: Licensed Clinical Social Worker

## 2023-01-14 ENCOUNTER — Other Ambulatory Visit (INDEPENDENT_AMBULATORY_CARE_PROVIDER_SITE_OTHER): Payer: Self-pay | Admitting: Primary Care

## 2023-01-14 ENCOUNTER — Encounter (INDEPENDENT_AMBULATORY_CARE_PROVIDER_SITE_OTHER): Payer: Self-pay

## 2023-01-14 DIAGNOSIS — R4581 Low self-esteem: Secondary | ICD-10-CM

## 2023-01-14 DIAGNOSIS — G4733 Obstructive sleep apnea (adult) (pediatric): Secondary | ICD-10-CM | POA: Diagnosis not present

## 2023-01-14 DIAGNOSIS — Z760 Encounter for issue of repeat prescription: Secondary | ICD-10-CM

## 2023-01-14 DIAGNOSIS — I10 Essential (primary) hypertension: Secondary | ICD-10-CM

## 2023-01-14 DIAGNOSIS — Z599 Problem related to housing and economic circumstances, unspecified: Secondary | ICD-10-CM

## 2023-01-14 DIAGNOSIS — F411 Generalized anxiety disorder: Secondary | ICD-10-CM

## 2023-01-14 NOTE — Telephone Encounter (Addendum)
Medication Refill - Medication:  losartan (COZAAR) 50 MG tablet  *took last pill today, and states need a refill today, does not want to go without BP med, leaving tomorrow (01/15/2023) to go out of town.  Requesting a call back about this @ # [phone]   Has the patient contacted their pharmacy? Yes, requested this medication weeks ago & states it was denied. Pharmacy sent a request over on 12/26/2022 for this.  Preferred Pharmacy (with phone number or street name):  [id number] - [place], [place] - [address]  Phone: [phone] Fax: [phone]   Has the patient been seen for an appointment in the last year OR does the patient have an upcoming appointment? YES, Last seen on 01/07/2023

## 2023-01-14 NOTE — BH Specialist Note (Signed)
Integrated Behavioral Health via Telemedicine Visit  01/14/2023 Catherine [name] [id number]  Number of Integrated Behavioral Health Clinician visits: 3- Third Visit  Session Start time: 1002   Session End time: 1135  Total time in minutes: 35   Referring Provider: PCP Patient/Family location: home [facility] Provider location: In office All persons participating in visit: pt, therapist and SW intern Types of Service: Individual psychotherapy and Video visit  I connected with Catherine [name] via  Telephone or Video Enabled Telemedicine Application  (Video is Caregility application) and verified that I am speaking with the correct person using two identifiers. Discussed confidentiality: Yes   I discussed the limitations of telemedicine and the availability of in person appointments.  Discussed there is a possibility of technology failure and discussed alternative modes of communication if that failure occurs.  I discussed that engaging in this telemedicine visit, they consent to the provision of behavioral healthcare and the services will be billed under their insurance.  Patient and/or legal guardian expressed understanding and consented to Telemedicine visit: Yes   Presenting Concerns: Patient and/or family reports the following symptoms/concerns: hot flashes, night sweats, anxiety Duration of problem: few months; Severity of problem: moderate  Patient and/or Family's Strengths/Protective Factors: Concrete supports in place (healthy food, safe environments, etc.)  Goals Addressed: Patient will:  Reduce symptoms of: agitation, anxiety, and stress   Increase knowledge and/or ability of: coping skills and stress reduction   Demonstrate ability to: Increase healthy adjustment to current life circumstances  Progress towards Goals: Ongoing  Interventions: Interventions utilized:  Solution-Focused Strategies Standardized Assessments completed: Not Needed  Patient and/or Family  Response: Patient shared that she is having a lot of hot flashes and night sweats increase agitation.  Patient reports that her daughter had a miscarriage recently.  Patient is requesting tips on how to navigate and cope with symptoms of menopause.  Assessment: Patient currently experiencing hot flashes, night sweats, anxiety.   Patient may benefit from scheduling a doctors appt with OBGYN.  Plan: Follow up with behavioral health clinician on : 4 weeks Behavioral recommendations: Shared Tips to reduce menopause sx. Visit local vitamin store: [email or web address] [email or web address]://www.goodrx.com/conditions/menopause/supplements-and-[email or web address]-meds Referral(s): Integrated [employer] (In Clinic) and [employer] [facility]: [email or web address]://www.goodrx.com/conditions/menopause/supplements-and-otc-meds [email or web address]  I discussed the assessment and treatment plan with the patient and/or parent/guardian. They were provided an opportunity to ask questions and all were answered. They agreed with the plan and demonstrated an understanding of the instructions.   They were advised to call back or seek an in-person evaluation if the symptoms worsen or if the condition fails to improve as anticipated.  [name], LCSWA

## 2023-01-15 MED ORDER — LOSARTAN POTASSIUM 50 MG PO TABS
100.0000 mg | ORAL_TABLET | Freq: Every day | ORAL | 1 refills | Status: DC
Start: 2023-01-15 — End: 2023-03-11

## 2023-01-15 NOTE — Telephone Encounter (Signed)
Requested Prescriptions  Pending Prescriptions Disp Refills   losartan (COZAAR) 50 MG tablet 180 tablet 1    Sig: Take 2 tablets (100 mg total) by mouth daily.     Cardiovascular:  Angiotensin Receptor Blockers Passed - 01/14/2023  1:33 PM      Passed - Cr in normal range and within 180 days    Creat  Date Value Ref Range Status  05/17/2014 0.81 0.50 - 1.10 mg/dL Final   Creatinine, Ser  Date Value Ref Range Status  10/01/2022 0.86 0.57 - 1.00 mg/dL Final         Passed - K in normal range and within 180 days    Potassium  Date Value Ref Range Status  10/01/2022 4.5 3.5 - 5.2 mmol/L Final         Passed - Patient is not pregnant      Passed - Last BP in normal range    BP Readings from Last 1 Encounters:  11/18/22 132/87         Passed - Valid encounter within last 6 months    Recent Outpatient Visits           1 week ago Palpitation   [place] [facility] Grayce Sessions, NP   2 months ago Depression with anxiety   [place] [facility] Grayce Sessions, NP   3 months ago Type 2 diabetes mellitus without complication, without long-term current use of insulin (HCC)   [place] [facility] Grayce Sessions, NP   3 months ago Depression with anxiety   [place] [facility] Grayce Sessions, NP   4 months ago Depression with anxiety    [facility] Grayce Sessions, NP       Future Appointments             In 1 week Randa Evens, Kinnie Scales, NP  [facility]

## 2023-01-21 DIAGNOSIS — R635 Abnormal weight gain: Secondary | ICD-10-CM | POA: Diagnosis not present

## 2023-01-21 DIAGNOSIS — R948 Abnormal results of function studies of other organs and systems: Secondary | ICD-10-CM | POA: Diagnosis not present

## 2023-01-27 ENCOUNTER — Encounter (INDEPENDENT_AMBULATORY_CARE_PROVIDER_SITE_OTHER): Payer: Self-pay | Admitting: Primary Care

## 2023-01-27 ENCOUNTER — Ambulatory Visit (INDEPENDENT_AMBULATORY_CARE_PROVIDER_SITE_OTHER): Payer: 59 | Admitting: Primary Care

## 2023-01-27 VITALS — BP 139/85 | HR 68 | Resp 16 | Wt 319.8 lb

## 2023-01-27 DIAGNOSIS — I10 Essential (primary) hypertension: Secondary | ICD-10-CM | POA: Diagnosis not present

## 2023-01-27 DIAGNOSIS — F411 Generalized anxiety disorder: Secondary | ICD-10-CM

## 2023-01-27 DIAGNOSIS — Z2821 Immunization not carried out because of patient refusal: Secondary | ICD-10-CM

## 2023-01-27 NOTE — Progress Notes (Signed)
[facility]  Virtual Visit  I connected with Catherine [name], on 01/27/2023 at 10:23 AM  audio and video application or by and verified that I am speaking with the correct person using two identifiers.   Consent: I discussed the limitations, risks, security and privacy concerns of performing an evaluation and management service by telephone and the availability of in person appointments. I also discussed with the patient that there may be a patient responsible charge related to this service. The patient expressed understanding and agreed to proceed.   Location of Patient: [facility] had to leave $17 for a Lift and daughter was picking her up  Location of Provider: [facility]   Persons participating in Telemedicine visit: Catherine Carp,  NP   History of Present Illness: [name] is a 52 y.o. female who presents for follow up of depression. Current symptoms include depressed mood, difficulty concentrating, fatigue, hopelessness, and insomnia.   Bp was elevated .  Past Medical History:  Diagnosis Date   Allergy    seasonal allergies   Anemia    hx of   Anxiety    on meds   Blood transfusion without reported diagnosis    x 3   Bronchitis    Depression    on meds   Diabetes mellitus without complication (HCC)    on meds   Hyperlipidemia    on meds   Hypertension    on meds   Sleep apnea    no CPAP machine being used at this time (08/19/2020)   Allergies  Allergen Reactions   Metformin And Related Other (See Comments)    Abdominal discomfort.   Latex Rash    Current Outpatient Medications on File Prior to Visit  Medication Sig Dispense Refill   acetaminophen (TYLENOL) 500 MG tablet Take 1,000 mg by mouth every 6 (six) hours as needed for moderate pain or headache.     albuterol (VENTOLIN HFA) 108 (90 Base) MCG/ACT inhaler INHALE 2 PUFFS INTO THE LUNGS 4 TIMES A DAY 18 each 1    beclomethasone (QVAR REDIHALER) 80 MCG/ACT inhaler Inhale 1 puff into the lungs 2 (two) times daily. 1 each 2   busPIRone (BUSPAR) 7.5 MG tablet Take 1 tablet (7.5 mg total) by mouth 3 (three) times daily. 270 tablet 1   ergocalciferol (VITAMIN D2) 1.25 MG (50000 UT) capsule Take 1 capsule (50,000 Units total) by mouth once a week. 8 capsule 0   escitalopram (LEXAPRO) 20 MG tablet Take 1 tablet (20 mg total) by mouth daily. 90 tablet 1   fluticasone (FLONASE) 50 MCG/ACT nasal spray Place 1 spray into both nostrils daily as needed for allergies. 11.1 mL 3   glimepiride (AMARYL) 2 MG tablet Take 1 tablet (2 mg total) by mouth daily before breakfast. 30 tablet 3   hydrochlorothiazide (HYDRODIURIL) 25 MG tablet TAKE 1 TABLET BY MOUTH EVERY DAY IN THE MORNING 90 tablet 1   losartan (COZAAR) 50 MG tablet Take 2 tablets (100 mg total) by mouth daily. 180 tablet 1   metroNIDAZOLE (FLAGYL) 500 MG tablet Take 1 tablet (500 mg total) by mouth 2 (two) times daily. 14 tablet 0   rosuvastatin (CRESTOR) 10 MG tablet Take 1 tablet (10 mg total) by mouth daily. 90 tablet 1   Sodium Sulfate-Mag Sulfate-KCl (SUTAB) [phone] MG TABS Take 1 kit by mouth as directed. MANUFACTURER CODES!! BIN: [id number] PCN: CN GROUP: [id number] MEMBER ID: [id number];RSW AS  SECONDARY INSURANCE ;NO PRIOR AUTHORIZATION 24 tablet 0   No current facility-administered medications on file prior to visit.    Observations/Objective: BP 139/85 (BP Location: Left Arm, Patient Position: Sitting, Cuff Size: Large)   Pulse 68   Resp 16   Wt (!) 319 lb 12.8 oz (145.1 kg)   SpO2 96%   BMI 50.09 kg/m    Assessment and Plan: Zalma was seen today for menopause and anxiety.  Diagnoses and all orders for this visit:  Influenza vaccination declined  Herpes zoster vaccination declined  Generalized anxiety disorder Followed by CSW   Essential hypertension BP goal - < 130/80 Explained that having normal blood pressure is the goal and  medications are helping to get to goal and maintain normal blood pressure. DIET: Limit salt intake, read nutrition labels to check salt content, limit fried and high fatty foods  Avoid using multisymptom OTC cold preparations that generally contain sudafed which can rise BP. Consult with pharmacist on best cold relief products to use for persons with HTN EXERCISE Discussed incorporating exercise such as walking - 30 minutes most days of the week and can do in 10 minute intervals    Added amlodipine , on hydrochlorothiazide and losartan       Follow Up Instructions: I discussed the assessment and treatment plan with the patient. The patient was provided an opportunity to ask questions and all were answered. The patient agreed with the plan and demonstrated an understanding of the instructions.   The patient was advised to call back or seek an in-person evaluation if the symptoms worsen or if the condition fails to improve as anticipated.  I provided 20 minutes total of non-face-to-face time during this encounter including median intraservice time, reviewing previous notes, investigations, ordering medications, medical decision making, coordinating care and patient verbalized understanding at the end of the visit.  This note has been created with Education officer, environmental. Any transcriptional errors are unintentional.   Grayce Sessions, NP 01/27/2023, 10:23 AM

## 2023-01-28 ENCOUNTER — Ambulatory Visit (INDEPENDENT_AMBULATORY_CARE_PROVIDER_SITE_OTHER): Payer: Self-pay | Admitting: *Deleted

## 2023-01-28 ENCOUNTER — Encounter (INDEPENDENT_AMBULATORY_CARE_PROVIDER_SITE_OTHER): Payer: Self-pay

## 2023-01-28 ENCOUNTER — Other Ambulatory Visit (INDEPENDENT_AMBULATORY_CARE_PROVIDER_SITE_OTHER): Payer: Self-pay

## 2023-01-28 MED ORDER — AMLODIPINE BESYLATE 10 MG PO TABS: 10.0000 mg | ORAL_TABLET | Freq: Every day | ORAL | 1 refills | Status: DC

## 2023-01-28 NOTE — Telephone Encounter (Signed)
Reason for Disposition  [1] Systolic BP  >= 130 OR Diastolic >= 80 AND [2] taking BP medications  Answer Assessment - Initial Assessment Questions 1. BLOOD PRESSURE: "What is the blood pressure?" "Did you take at least two measurements 5 minutes apart?"     She called in concerned that her BP is elevated.   BP 155/109 today.  Yesterday it was elevated.   I came in to see [facility] yesterday.  It was high in the office too.  My ride had to leave so I could not stay for my whole appt.     I had a headache yesterday morning.    Marcelino Duster did not address the BP yesterday.   My BP is still elevated.     I'm not feeling like myself.   I have pain in left shoulder and down my left arm yesterday.   I have shoulder problems in that shoulder.     No shortness of breath during the episode but I was light headed.   This happened yesterday evening.     2. ONSET: "When did you take your blood pressure?" I called EMS out and they said my BP was high too.       Today 3. HOW: "How did you take your blood pressure?" (e.g., automatic home BP monitor, visiting nurse)     I have a home monitor 4. HISTORY: "Do you have a history of high blood pressure?"     Yes 5. MEDICINES: "Are you taking any medicines for blood pressure?" "Have you missed any doses recently?"     I do take BP medications.   6. OTHER SYMPTOMS: "Do you have any symptoms?" (e.g., blurred vision, chest pain, difficulty breathing, headache, weakness)     I never actually got to see [facility] yesterday.   I had to leave so my BP issue was not addressed.   7. PREGNANCY: "Is there any chance you are pregnant?" "When was your last menstrual period?"     Not asked  Protocols used: Blood Pressure - High-A-AH

## 2023-01-28 NOTE — Telephone Encounter (Signed)
Pt states she has been taking both bp medication. Per Marcelino Duster send in Amlodipine 10mg  once a day quantity 90 with 1 refill to pharmacy and pt will need to come in for a bp check in 2-3 weeks. Tried contacting pt to make her aware of provider comment pt didn't answer

## 2023-01-28 NOTE — Telephone Encounter (Signed)
Sent pt a Well[employer] geologist.

## 2023-01-28 NOTE — Telephone Encounter (Signed)
  Chief Complaint: Was in office for visit on 01/27/2023 but did not actually see Catherine Passe, NP because her ride had to leave.   Her BP was elevated yesterday in the office but the issue was never addressed.    Her BP is 155/109 today and she is having headaches.    Symptoms: She mentioned she is not feeling like herself.   She mentioned she has left shoulder issues.  She had pain in left shoulder and down her left arm.  She had a spell yesterday evening of feeling lightheaded.   No shortness of breath or chest pain Frequency: Yesterday Pertinent Negatives: Patient denies being seen by Marcelino Duster yesterday during office visit. Disposition: [] ED /[] Urgent Care (no appt availability in office) / [x] Appointment(In office/virtual)/ []  [place] [facility]/ [] Home Care/ [] Refused Recommended Disposition /[] [place] [facility]/ []  Follow-up with PCP Additional Notes: Pt requesting to be seen for her elevated BP and headaches.   Sending a message to see if she can be worked in.   No appts with Marcelino Duster until Oct. 21.   Needs to be seen before the.

## 2023-02-11 ENCOUNTER — Ambulatory Visit (INDEPENDENT_AMBULATORY_CARE_PROVIDER_SITE_OTHER): Payer: 59 | Admitting: Licensed Clinical Social Worker

## 2023-02-11 DIAGNOSIS — F418 Other specified anxiety disorders: Secondary | ICD-10-CM

## 2023-02-13 DIAGNOSIS — G4733 Obstructive sleep apnea (adult) (pediatric): Secondary | ICD-10-CM | POA: Diagnosis not present

## 2023-02-17 DIAGNOSIS — R7303 Prediabetes: Secondary | ICD-10-CM | POA: Diagnosis not present

## 2023-02-17 DIAGNOSIS — E66813 Obesity, class 3: Secondary | ICD-10-CM | POA: Diagnosis not present

## 2023-02-17 DIAGNOSIS — Z6841 Body Mass Index (BMI) 40.0 and over, adult: Secondary | ICD-10-CM | POA: Diagnosis not present

## 2023-02-17 DIAGNOSIS — I10 Essential (primary) hypertension: Secondary | ICD-10-CM | POA: Diagnosis not present

## 2023-02-18 DIAGNOSIS — Z713 Dietary counseling and surveillance: Secondary | ICD-10-CM | POA: Diagnosis not present

## 2023-02-19 ENCOUNTER — Telehealth: Payer: Self-pay | Admitting: Primary Care

## 2023-02-19 NOTE — Telephone Encounter (Signed)
Copied from CRM [id number]. Topic: General - Other >> Feb 19, 2023  1:10 PM Dominique E wrote: Reason for CRM: Pt wants a call back from [facility] regarding her insulin injections and pills. Does not want to speak to [facility] nurse.

## 2023-02-22 NOTE — Telephone Encounter (Signed)
Returned pt call. Pt states she is okay she has schedule a virtual appt with michelle for 11/6

## 2023-02-24 ENCOUNTER — Telehealth (INDEPENDENT_AMBULATORY_CARE_PROVIDER_SITE_OTHER): Payer: 59 | Admitting: Primary Care

## 2023-02-24 ENCOUNTER — Encounter (INDEPENDENT_AMBULATORY_CARE_PROVIDER_SITE_OTHER): Payer: Self-pay | Admitting: Primary Care

## 2023-02-24 DIAGNOSIS — E119 Type 2 diabetes mellitus without complications: Secondary | ICD-10-CM | POA: Diagnosis not present

## 2023-02-24 DIAGNOSIS — Z7985 Long-term (current) use of injectable non-insulin antidiabetic drugs: Secondary | ICD-10-CM

## 2023-02-24 MED ORDER — SEMAGLUTIDE-WEIGHT MANAGEMENT 0.25 MG/0.5ML ~~LOC~~ SOAJ: 0.2500 mg | SUBCUTANEOUS | 4 refills | Status: DC

## 2023-02-24 NOTE — Progress Notes (Signed)
[facility]  Virtual Visit  I connected with Catherine [name], on 02/24/2023 at 2:58 PM through an audio and video application and verified that I am speaking with the correct person using two identifiers.   Consent: I discussed the limitations, risks, security and privacy concerns of performing an evaluation and management service by telephone and the availability of in person appointments. I also discussed with the patient that there may be a patient responsible charge related to this service. The patient expressed understanding and agreed to proceed.   Location of Patient: Home  Location of Provider: [facility]   Persons participating in Telemedicine visit: Neita Carp,  NP   History of Present Illness: Catherine [name] is a 52 year old female that has recently been seen by [facility] Bariatric advised her to d/w PCP weight loss medication prior to considering surgery with a loss of 20 lbs.  Past Medical History:  Diagnosis Date   Allergy    seasonal allergies   Anemia    hx of   Anxiety    on meds   Blood transfusion without reported diagnosis    x 3   Bronchitis    Depression    on meds   Diabetes mellitus without complication (HCC)    on meds   Hyperlipidemia    on meds   Hypertension    on meds   Sleep apnea    no CPAP machine being used at this time (08/19/2020)   Allergies  Allergen Reactions   Metformin And Related Other (See Comments)    Abdominal discomfort.   Latex Rash    Current Outpatient Medications on File Prior to Visit  Medication Sig Dispense Refill   acetaminophen (TYLENOL) 500 MG tablet Take 1,000 mg by mouth every 6 (six) hours as needed for moderate pain or headache.     albuterol (VENTOLIN HFA) 108 (90 Base) MCG/ACT inhaler INHALE 2 PUFFS INTO THE LUNGS 4 TIMES A DAY 18 each 1   amLODipine (NORVASC) 10 MG tablet Take 1 tablet (10 mg total) by mouth  daily. 90 tablet 1   beclomethasone (QVAR REDIHALER) 80 MCG/ACT inhaler Inhale 1 puff into the lungs 2 (two) times daily. 1 each 2   busPIRone (BUSPAR) 7.5 MG tablet Take 1 tablet (7.5 mg total) by mouth 3 (three) times daily. 270 tablet 1   ergocalciferol (VITAMIN D2) 1.25 MG (50000 UT) capsule Take 1 capsule (50,000 Units total) by mouth once a week. 8 capsule 0   escitalopram (LEXAPRO) 20 MG tablet Take 1 tablet (20 mg total) by mouth daily. 90 tablet 1   fluticasone (FLONASE) 50 MCG/ACT nasal spray Place 1 spray into both nostrils daily as needed for allergies. 11.1 mL 3   glimepiride (AMARYL) 2 MG tablet Take 1 tablet (2 mg total) by mouth daily before breakfast. 30 tablet 3   hydrochlorothiazide (HYDRODIURIL) 25 MG tablet TAKE 1 TABLET BY MOUTH EVERY DAY IN THE MORNING 90 tablet 1   losartan (COZAAR) 50 MG tablet Take 2 tablets (100 mg total) by mouth daily. 180 tablet 1   metroNIDAZOLE (FLAGYL) 500 MG tablet Take 1 tablet (500 mg total) by mouth 2 (two) times daily. 14 tablet 0   rosuvastatin (CRESTOR) 10 MG tablet Take 1 tablet (10 mg total) by mouth daily. 90 tablet 1   Sodium Sulfate-Mag Sulfate-KCl (SUTAB) [phone] MG TABS Take 1 kit by mouth as directed. MANUFACTURER CODES!! BIN:  [id number] PCN: CN GROUP: [id number] MEMBER ID: [id number];HYQ AS SECONDARY INSURANCE ;NO PRIOR AUTHORIZATION 24 tablet 0   No current facility-administered medications on file prior to visit.    Observations/Objective: There were no vitals taken for this visit.   Assessment and Plan: Diagnoses and all orders for this visit:  Morbid obesity (HCC) -     Semaglutide-Weight Management 0.25 MG/0.5ML SOAJ; Inject 0.25 mg into the skin once a week.  Type 2 diabetes mellitus without complication, without long-term current use of insulin (HCC) -     Semaglutide-Weight Management 0.25 MG/0.5ML SOAJ; Inject 0.25 mg into the skin once a week.     Follow Up Instructions: 1 month   I discussed the assessment  and treatment plan with the patient. The patient was provided an opportunity to ask questions and all were answered. The patient agreed with the plan and demonstrated an understanding of the instructions.   The patient was advised to call back or seek an in-person evaluation if the symptoms worsen or if the condition fails to improve as anticipated.     I provided 20 minutes total of non-face-to-face time during this encounter including median intraservice time, reviewing previous notes, investigations, ordering medications, medical decision making, coordinating care and patient verbalized understanding at the end of the visit.    This note has been created with Education officer, environmental. Any transcriptional errors are unintentional.   Grayce Sessions, NP 02/24/2023, 2:58 PM

## 2023-02-24 NOTE — BH Specialist Note (Signed)
Integrated Behavioral Health via Telemedicine Visit  02/11/2023 Catherine [name] [id number]  Number of Integrated Behavioral Health Clinician visits: 4- Fourth Visit  Session Start time: 1030   Session End time: 1110  Total time in minutes: 40   Referring Provider: PCP Patient/Family location: home [facility] Provider location: In office All persons participating in visit: pt, therapist and SW intern Types of Service: Individual psychotherapy and Video visit  I connected with [name] and/or via  Telephone or Video Enabled Telemedicine Application  (Video is Caregility application) and verified that I am speaking with the correct person using two identifiers. Discussed confidentiality: Yes   I discussed the limitations of telemedicine and the availability of in person appointments.  Discussed there is a possibility of technology failure and discussed alternative modes of communication if that failure occurs.  I discussed that engaging in this telemedicine visit, they consent to the provision of behavioral healthcare and the services will be billed under their insurance.  Patient and/or legal guardian expressed understanding and consented to Telemedicine visit: Yes   Presenting Concerns: Patient and/or family reports the following symptoms/concerns: hot flashes, night sweats, anxiety Duration of problem: few months; Severity of problem: moderate  Patient and/or Family's Strengths/Protective Factors: Concrete supports in place (healthy food, safe environments, etc.) and Sense of purpose  Goals Addressed: Patient will:  Reduce symptoms of: agitation, anxiety, and stress   Increase knowledge and/or ability of: coping skills and stress reduction   Demonstrate ability to: Increase healthy adjustment to current life circumstances Stress management Self esteem enhancement  Progress towards  Goals: Ongoing  Interventions: Interventions utilized:  Psychoeducation and/or Health Education Standardized Assessments completed: none  Patient and/or Family Response: Patient discussed her desires to move from the area they are currently living in.  Patient is still looking for employment.  Patient was excited to start her weight loss journey.  Assessment: Patient currently experiencing hot flashes and night sweats.   Patient may benefit from exercising and developing healthy realtionsips.  Plan: Follow up with behavioral health clinician on : 4 weeks Behavioral recommendations: exercise  Referral(s): Integrated [employer] (In Clinic)  I discussed the assessment and treatment plan with the patient and/or parent/guardian. They were provided an opportunity to ask questions and all were answered. They agreed with the plan and demonstrated an understanding of the instructions.   They were advised to call back or seek an in-person evaluation if the symptoms worsen or if the condition fails to improve as anticipated.  [name], LCSWA

## 2023-02-25 ENCOUNTER — Other Ambulatory Visit: Payer: Self-pay

## 2023-03-02 ENCOUNTER — Other Ambulatory Visit (INDEPENDENT_AMBULATORY_CARE_PROVIDER_SITE_OTHER): Payer: Self-pay | Admitting: Primary Care

## 2023-03-02 DIAGNOSIS — E119 Type 2 diabetes mellitus without complications: Secondary | ICD-10-CM

## 2023-03-02 NOTE — Telephone Encounter (Signed)
Will forward to provider  

## 2023-03-04 ENCOUNTER — Other Ambulatory Visit (INDEPENDENT_AMBULATORY_CARE_PROVIDER_SITE_OTHER): Payer: Self-pay

## 2023-03-04 DIAGNOSIS — Z713 Dietary counseling and surveillance: Secondary | ICD-10-CM | POA: Diagnosis not present

## 2023-03-04 DIAGNOSIS — E119 Type 2 diabetes mellitus without complications: Secondary | ICD-10-CM

## 2023-03-04 MED ORDER — WEGOVY 0.25 MG/0.5ML ~~LOC~~ SOAJ: 0.5000 mg | SUBCUTANEOUS | 4 refills | Status: DC

## 2023-03-08 ENCOUNTER — Ambulatory Visit (INDEPENDENT_AMBULATORY_CARE_PROVIDER_SITE_OTHER): Payer: Self-pay | Admitting: *Deleted

## 2023-03-08 ENCOUNTER — Encounter (INDEPENDENT_AMBULATORY_CARE_PROVIDER_SITE_OTHER): Payer: Self-pay | Admitting: Primary Care

## 2023-03-08 ENCOUNTER — Encounter (HOSPITAL_COMMUNITY): Payer: Self-pay

## 2023-03-08 ENCOUNTER — Other Ambulatory Visit: Payer: Self-pay

## 2023-03-08 DIAGNOSIS — R002 Palpitations: Secondary | ICD-10-CM | POA: Diagnosis not present

## 2023-03-08 DIAGNOSIS — R457 State of emotional shock and stress, unspecified: Secondary | ICD-10-CM | POA: Diagnosis not present

## 2023-03-08 NOTE — Telephone Encounter (Signed)
Called pt to get a sooner apt. I was able to schedule that for her

## 2023-03-08 NOTE — Telephone Encounter (Signed)
  Chief Complaint: Intermittent palpitations Symptoms: I feel like I'm having palpitations, mild lightheadedness once.    Frequency: Since Sat. On and off. Pertinent Negatives: Patient denies shortness of breath, sweating,  or chest pain Disposition: [] ED /[] Urgent Care (no appt availability in office) / [x] Appointment(In office/virtual)/ []  [place] [facility]/ [] Home Care/ [] Refused Recommended Disposition /[] [place] [facility]/ []  Follow-up with PCP Additional Notes: Appt made with Gwinda Passe, NP for 03/15/2023 at 4:10.   I put her on the wait list in case of a cancellation and gave ED instructions.

## 2023-03-08 NOTE — Telephone Encounter (Signed)
Reason for Disposition  [1] Palpitations AND [2] no improvement after using Care Advice  Answer Assessment - Initial Assessment Questions 1. DESCRIPTION: "Please describe your heart rate or heartbeat that you are having" (e.g., fast/slow, regular/irregular, skipped or extra beats, "palpitations")     I'm having palpitations.   No chest pain or sweating.  Not really having shortness of breath.    2. ONSET: "When did it start?" (Minutes, hours or days)      Started yesterday evening I ate lunch and shortly after that I took my medications for the day.   Sat. Night I was up and down.    I didn't feel good Sat. Night.    I had a lazy day yesterday.   I didn't get up until 3:00 PM.   When I got up last night that started feeling it.   I took a shower to see if it would help but it didn't.     I took my night time medications before going to bed.   3. DURATION: "How long does it last" (e.g., seconds, minutes, hours)     Started yesterday. This morning when I woke up I started feeling it again.   4. PATTERN "Does it come and go, or has it been constant since it started?"  "Does it get worse with exertion?"   "Are you feeling it now?"     Intermettently 5. TAP: "Using your hand, can you tap out what you are feeling on a chair or table in front of you, so that I can hear?" (Note: not all patients can do this)       Not asked 6. HEART RATE: "Can you tell me your heart rate?" "How many beats in 15 seconds?"  (Note: not all patients can do this)       No cardiac history 7. RECURRENT SYMPTOM: "Have you ever had this before?" If Yes, ask: "When was the last time?" and "What happened that time?"      I've had to see cardiologist before but I forgot what it was for.   The tests came back ok.      She has just added my amlodipine.  I don't take it daily.   Sometimes I forget.   I started this on Oct. 2024.   I get lightheaded sometimes.   My top number is low for me. 8. CAUSE: "What do you think is causing the  palpitations?"     Maybe the amlodipine.    9. CARDIAC HISTORY: "Do you have any history of heart disease?" (e.g., heart attack, angina, bypass surgery, angioplasty, arrhythmia)      No 10. OTHER SYMPTOMS: "Do you have any other symptoms?" (e.g., dizziness, chest pain, sweating, difficulty breathing)       Mild lightheadedness   11. PREGNANCY: "Is there any chance you are pregnant?" "When was your last menstrual period?"       Not asked  Protocols used: Heart Rate and Heartbeat Questions-A-AH

## 2023-03-08 NOTE — ED Triage Notes (Signed)
Pt bib EMS c/o palpitations that started yesterday at 15:00 but became worse today.

## 2023-03-09 ENCOUNTER — Emergency Department (HOSPITAL_COMMUNITY)
Admission: EM | Admit: 2023-03-09 | Discharge: 2023-03-09 | Discharge status: Against Medical Advice | Payer: 59 | Attending: Emergency Medicine | Admitting: Emergency Medicine

## 2023-03-09 ENCOUNTER — Emergency Department (HOSPITAL_COMMUNITY): Payer: 59

## 2023-03-09 DIAGNOSIS — R002 Palpitations: Secondary | ICD-10-CM | POA: Insufficient documentation

## 2023-03-09 DIAGNOSIS — Z5321 Procedure and treatment not carried out due to patient leaving prior to being seen by health care provider: Secondary | ICD-10-CM | POA: Diagnosis not present

## 2023-03-09 DIAGNOSIS — I4891 Unspecified atrial fibrillation: Secondary | ICD-10-CM | POA: Diagnosis not present

## 2023-03-09 LAB — CBC
HCT: 39.5 % (ref 36.0–46.0)
Hemoglobin: 12.3 g/dL (ref 12.0–15.0)
MCH: 29.1 pg (ref 26.0–34.0)
MCHC: 31.1 g/dL (ref 30.0–36.0)
MCV: 93.4 fL (ref 80.0–100.0)
Platelets: 255 10*3/uL (ref 150–400)
RBC: 4.23 MIL/uL (ref 3.87–5.11)
RDW: 13.5 % (ref 11.5–15.5)
WBC: 8.6 10*3/uL (ref 4.0–10.5)
nRBC: 0 % (ref 0.0–0.2)

## 2023-03-09 LAB — BASIC METABOLIC PANEL
Anion gap: 8 (ref 5–15)
BUN: 14 mg/dL (ref 6–20)
CO2: 28 mmol/L (ref 22–32)
Calcium: 9.3 mg/dL (ref 8.9–10.3)
Chloride: 102 mmol/L (ref 98–111)
Creatinine, Ser: 0.97 mg/dL (ref 0.44–1.00)
GFR, Estimated: >60 mL/min (ref >60)
Glucose, Bld: 118 mg/dL — ABNORMAL HIGH (ref 70–99)
Potassium: 3.4 mmol/L — ABNORMAL LOW (ref 3.5–5.1)
Sodium: 138 mmol/L (ref 135–145)

## 2023-03-09 LAB — TROPONIN I (HIGH SENSITIVITY): Troponin I (High Sensitivity): 5 ng/L (ref <18)

## 2023-03-09 NOTE — ED Provider Triage Note (Signed)
Emergency Medicine Provider Triage Evaluation Note  Catherine [name] , a 52 y.o. female  was evaluated in triage.  Pt complains of palpitation.  She reports that she has had occasional palpitations in the past but today they have been persistent and frequent.  No associated chest pain.  Review of Systems  Positive: Palpitations Negative: Chest pain  Physical Exam  BP 134/79   Pulse 77   Temp 98.4 F (36.9 C) (Oral)   Resp 16   Ht 5\' 7"  (1.702 m)   Wt (!) 145.2 kg   SpO2 97%   BMI 50.12 kg/m  Gen:   Awake, no distress   Resp:  Normal effort  MSK:   Moves extremities without difficulty  Other:  Heart RRR, no murmur, nl tones  Medical Decision Making  Medically screening exam initiated at 1:03 AM.  Appropriate orders placed.  Catherine [name] was informed that the remainder of the evaluation will be completed by another provider, this initial triage assessment does not replace that evaluation, and the importance of remaining in the ED until their evaluation is complete.     Gilda Crease, MD 03/09/23 [phone]

## 2023-03-09 NOTE — ED Notes (Signed)
Pt seen leaving the ED

## 2023-03-10 ENCOUNTER — Telehealth (INDEPENDENT_AMBULATORY_CARE_PROVIDER_SITE_OTHER): Payer: Self-pay | Admitting: Primary Care

## 2023-03-10 NOTE — Telephone Encounter (Signed)
 Called to remind pt about apt

## 2023-03-11 ENCOUNTER — Encounter (INDEPENDENT_AMBULATORY_CARE_PROVIDER_SITE_OTHER): Payer: Self-pay | Admitting: Primary Care

## 2023-03-11 ENCOUNTER — Ambulatory Visit (INDEPENDENT_AMBULATORY_CARE_PROVIDER_SITE_OTHER): Payer: 59 | Admitting: Primary Care

## 2023-03-11 ENCOUNTER — Ambulatory Visit (INDEPENDENT_AMBULATORY_CARE_PROVIDER_SITE_OTHER): Payer: 59 | Admitting: Licensed Clinical Social Worker

## 2023-03-11 ENCOUNTER — Other Ambulatory Visit (INDEPENDENT_AMBULATORY_CARE_PROVIDER_SITE_OTHER): Payer: Self-pay | Admitting: Primary Care

## 2023-03-11 DIAGNOSIS — Z760 Encounter for issue of repeat prescription: Secondary | ICD-10-CM | POA: Diagnosis not present

## 2023-03-11 DIAGNOSIS — Z7984 Long term (current) use of oral hypoglycemic drugs: Secondary | ICD-10-CM

## 2023-03-11 DIAGNOSIS — I10 Essential (primary) hypertension: Secondary | ICD-10-CM

## 2023-03-11 DIAGNOSIS — F418 Other specified anxiety disorders: Secondary | ICD-10-CM

## 2023-03-11 DIAGNOSIS — E119 Type 2 diabetes mellitus without complications: Secondary | ICD-10-CM | POA: Diagnosis not present

## 2023-03-11 DIAGNOSIS — F411 Generalized anxiety disorder: Secondary | ICD-10-CM

## 2023-03-11 MED ORDER — ESCITALOPRAM OXALATE 20 MG PO TABS: 20.0000 mg | ORAL_TABLET | Freq: Every day | ORAL | 1 refills | Status: DC

## 2023-03-11 MED ORDER — WEGOVY 0.5 MG/0.5ML ~~LOC~~ SOAJ: 0.5000 mg | SUBCUTANEOUS | 2 refills | Status: DC

## 2023-03-11 MED ORDER — GLIMEPIRIDE 2 MG PO TABS: 2.0000 mg | ORAL_TABLET | Freq: Every day | ORAL | 1 refills | Status: DC

## 2023-03-11 MED ORDER — VALSARTAN 160 MG PO TABS: 160.0000 mg | ORAL_TABLET | Freq: Every day | ORAL | 3 refills | Status: DC

## 2023-03-11 MED ORDER — ROSUVASTATIN CALCIUM 10 MG PO TABS: 10.0000 mg | ORAL_TABLET | Freq: Every day | ORAL | 1 refills | Status: DC

## 2023-03-11 MED ORDER — FLUTICASONE PROPIONATE 50 MCG/ACT NA SUSP: 1.0000 | Freq: Every day | NASAL | 3 refills | Status: DC | PRN

## 2023-03-11 NOTE — Progress Notes (Signed)
Heart palpitations x 4 days Had ED visit

## 2023-03-11 NOTE — BH Specialist Note (Signed)
Integrated Behavioral Health Follow Up In-Person Visit  MRN: [id number] Name: Catherine [name]  Number of Integrated Behavioral Health Clinician visits: 5-Fifth Visit  Session Start time: 1027   Session End time: 1045  Total time in minutes: 18   Types of Service: Individual psychotherapy and Health & Behavioral Assessment/Intervention  Interpretor:No. Interpretor Name and Language: ENG  Subjective: Catherine [name] is a 52 y.o. female accompanied by  Daughter, grandson Patient was referred by PCP for hot flashes and night sweats anxiety.. Patient reports the following symptoms/concerns: night sweats Duration of problem: a few months ; Severity of problem: mild  Objective: Mood: Euphoric and Affect: Appropriate Risk of harm to self or others: No plan to harm self or others  Life Context: Family and Social: Pt lives with her daughter and grandson. Pt doesn't have her own transportation. School/Work: pt stated that work is okay. Self-Care: Doing this with her family that help to reduce stress. Life Changes: Daughter got a new job , she is exteremly happy for her.  Patient and/or Family's Strengths/Protective Factors: Social connections and Concrete supports in place (healthy food, safe environments, etc.)  Goals Addressed: Patient will:  Reduce symptoms of: anxiety   Increase knowledge and/or ability of: coping skills, healthy habits, and stress reduction   Demonstrate ability to: Increase healthy adjustment to current life circumstances, Increase adequate support systems for patient/family, and Increase motivation to adhere to plan of care  Progress towards Goals: Ongoing  Interventions: Interventions utilized:  Motivational Interviewing Standardized Assessments completed: Not Needed  Patient and/or Family Response: Patient stated that she is overall doing well.  Patient stated that she is taking charge of her physical health and has started her weight loss journey .   Patient is also looking for an OB/GYN to address her   "personal summers" .  Patient Centered Plan: Patient is on the following Treatment Plan(s): Pt will update [place] on her weight loss journey. Pt will also update [place] on her search from a new obgyn to address hormonal imbalances that might be causing her hot flashes and night sweats. Assessment: Patient currently experiencing Patient currently experiencing hot flashes and night sweats.    Patient may benefit from exercising and developing healthy realtionsips..   Plan: Follow up with behavioral health clinician on : 4 week Behavioral recommendations: exercise  Referral(s):  Find a obgyn  [name], [address]

## 2023-03-12 DIAGNOSIS — E119 Type 2 diabetes mellitus without complications: Secondary | ICD-10-CM | POA: Diagnosis not present

## 2023-03-12 DIAGNOSIS — Z6841 Body Mass Index (BMI) 40.0 and over, adult: Secondary | ICD-10-CM | POA: Diagnosis not present

## 2023-03-12 DIAGNOSIS — G4733 Obstructive sleep apnea (adult) (pediatric): Secondary | ICD-10-CM | POA: Diagnosis not present

## 2023-03-12 DIAGNOSIS — I10 Essential (primary) hypertension: Secondary | ICD-10-CM | POA: Diagnosis not present

## 2023-03-12 DIAGNOSIS — F418 Other specified anxiety disorders: Secondary | ICD-10-CM | POA: Diagnosis not present

## 2023-03-12 DIAGNOSIS — Z713 Dietary counseling and surveillance: Secondary | ICD-10-CM | POA: Diagnosis not present

## 2023-03-12 NOTE — Telephone Encounter (Signed)
Medication Discontinued 03/11/23 by PCP Requested Prescriptions  Pending Prescriptions Disp Refills   hydrochlorothiazide (HYDRODIURIL) 25 MG tablet [Pharmacy Med Name: HYDROCHLOROTHIAZIDE 25 MG TAB] 90 tablet 1    Sig: TAKE 1 TABLET BY MOUTH EVERY DAY IN THE MORNING     Cardiovascular: Diuretics - Thiazide Failed - 03/11/2023  1:02 PM      Failed - K in normal range and within 180 days    Potassium  Date Value Ref Range Status  03/09/2023 3.4 (L) 3.5 - 5.1 mmol/L Final         Failed - Last BP in normal range    BP Readings from Last 1 Encounters:  03/11/23 (!) 129/91         Passed - Cr in normal range and within 180 days    Creat  Date Value Ref Range Status  05/17/2014 0.81 0.50 - 1.10 mg/dL Final   Creatinine, Ser  Date Value Ref Range Status  03/09/2023 0.97 0.44 - 1.00 mg/dL Final         Passed - Na in normal range and within 180 days    Sodium  Date Value Ref Range Status  03/09/2023 138 135 - 145 mmol/L Final  10/01/2022 143 134 - 144 mmol/L Final         Passed - Valid encounter within last 6 months    Recent Outpatient Visits           Yesterday Medication refill   [place] [facility] Grayce Sessions, NP   2 weeks ago Morbid obesity [facility])   [place] [facility] Grayce Sessions, NP   1 month ago Influenza vaccination declined   [place] [facility] Grayce Sessions, NP   2 months ago Palpitation   [place] [facility] Grayce Sessions, NP   4 months ago Depression with anxiety   [place] [facility] Grayce Sessions, NP

## 2023-03-14 NOTE — Progress Notes (Signed)
[facility]   Subjective:   Catherine [name] is a 52 y.o. female presents for emergency room follow-up.  Patient presented on  03/09/23, with heart palpitations.  However after waiting 4 hours to be seen she eloped.  Today she denies palpitations No headache, No chest pain, No abdominal pain - No Nausea, No new weakness tingling or numbness, No Cough - shortness of breath.  Patient does have a history of anxiety attacks and depression and on medication.  Past Medical History:  Diagnosis Date   Allergy    seasonal allergies   Anemia    hx of   Anxiety    on meds   Blood transfusion without reported diagnosis    x 3   Bronchitis    Depression    on meds   Diabetes mellitus without complication (HCC)    on meds   Hyperlipidemia    on meds   Hypertension    on meds   Sleep apnea    no CPAP machine being used at this time (08/19/2020)     Allergies  Allergen Reactions   Metformin And Related Other (See Comments)    Abdominal discomfort.   Latex Rash      Current Outpatient Medications on File Prior to Visit  Medication Sig Dispense Refill   acetaminophen (TYLENOL) 500 MG tablet Take 1,000 mg by mouth every 6 (six) hours as needed for moderate pain or headache.     albuterol (VENTOLIN HFA) 108 (90 Base) MCG/ACT inhaler INHALE 2 PUFFS INTO THE LUNGS 4 TIMES A DAY 18 each 1   beclomethasone (QVAR REDIHALER) 80 MCG/ACT inhaler Inhale 1 puff into the lungs 2 (two) times daily. 1 each 2   busPIRone (BUSPAR) 7.5 MG tablet Take 1 tablet (7.5 mg total) by mouth 3 (three) times daily. 270 tablet 1   ergocalciferol (VITAMIN D2) 1.25 MG (50000 UT) capsule Take 1 capsule (50,000 Units total) by mouth once a week. 8 capsule 0   metroNIDAZOLE (FLAGYL) 500 MG tablet Take 1 tablet (500 mg total) by mouth 2 (two) times daily. 14 tablet 0   No current facility-administered medications on file prior to visit.     Review of System: ROS  Objective:  BP (!) 129/91 (BP  Location: Right Arm, Patient Position: Sitting, Cuff Size: Large)   Pulse 75   Temp 97.9 F (36.6 C) (Oral)   Resp 20   Ht 5\' 7"  (1.702 m)   Wt (!) 318 lb (144.2 kg)   SpO2 97%   BMI 49.81 kg/m   Filed Weights   03/11/23 0945  Weight: (!) 318 lb (144.2 kg)   Physical Exam: General Appearance: Well nourished, morbid obese in no apparent distress. Eyes: PERRLA, EOMs, conjunctiva no swelling or erythema Sinuses: No Frontal/maxillary tenderness ENT/Mouth: Ext aud canals clear, TMs without erythema, bulging. No erythema, swelling, or exudate on post pharynx.  Tonsils not swollen or erythematous. Hearing normal.  Neck: Supple, thyroid normal.  Respiratory: Respiratory effort normal, BS equal bilaterally without rales, rhonchi, wheezing or stridor.  Cardio: RRR with no MRGs. Brisk peripheral pulses without edema.  Abdomen: Soft, + BS.  Non tender, no guarding, rebound, hernias, masses. Lymphatics: Non tender without lymphadenopathy.  Musculoskeletal: Full ROM, 5/5 strength, normal gait.  Skin: Warm, dry without rashes, lesions, ecchymosis.  Neuro: Cranial nerves intact. Normal muscle tone, no cerebellar symptoms. Sensation intact.  Psych: Awake and oriented X 3, normal affect, Insight and Judgment appropriate.    Assessment:  Delice Bison  was seen today for palpitations.  Diagnoses and all orders for this visit:  Medication refill  Essential hypertension BP goal - < 130/80 Explained that having normal blood pressure is the goal and medications are helping to get to goal and maintain normal blood pressure. DIET: Limit salt intake, read nutrition labels to check salt content, limit fried and high fatty foods  Avoid using multisymptom OTC cold preparations that generally contain sudafed which can rise BP. Consult with pharmacist on best cold relief products to use for persons with HTN EXERCISE Discussed incorporating exercise such as walking - 30 minutes most days of the week and can do in  10 minute intervals    -     valsartan (DIOVAN) 160 MG tablet; Take 1 tablet (160 mg total) by mouth daily.  Depression with anxiety -     escitalopram (LEXAPRO) 20 MG tablet; Take 1 tablet (20 mg total) by mouth daily.  Morbid obesity (HCC) -     fluticasone (FLONASE) 50 MCG/ACT nasal spray; Place 1 spray into both nostrils daily as needed for allergies.  Type 2 diabetes mellitus without complication, without long-term current use of insulin (HCC) -     glimepiride (AMARYL) 2 MG tablet; Take 1 tablet (2 mg total) by mouth daily before breakfast.  Other orders -     rosuvastatin (CRESTOR) 10 MG tablet; Take 1 tablet (10 mg total) by mouth daily. -     Semaglutide-Weight Management (WEGOVY) 0.5 MG/0.5ML SOAJ; Inject 0.5 mg into the skin once a week.      Meds ordered this encounter  Medications   valsartan (DIOVAN) 160 MG tablet    Sig: Take 1 tablet (160 mg total) by mouth daily.    Dispense:  90 tablet    Refill:  3    Order Specific Question:   Supervising Provider    Answer:   Quentin Angst [[id number]]   escitalopram (LEXAPRO) 20 MG tablet    Sig: Take 1 tablet (20 mg total) by mouth daily.    Dispense:  90 tablet    Refill:  1    Order Specific Question:   Supervising Provider    Answer:   Quentin Angst [[id number]]   fluticasone (FLONASE) 50 MCG/ACT nasal spray    Sig: Place 1 spray into both nostrils daily as needed for allergies.    Dispense:  11.1 mL    Refill:  3    Order Specific Question:   Supervising Provider    Answer:   Quentin Angst [[id number]]   glimepiride (AMARYL) 2 MG tablet    Sig: Take 1 tablet (2 mg total) by mouth daily before breakfast.    Dispense:  90 tablet    Refill:  1    Order Specific Question:   Supervising Provider    Answer:   Quentin Angst [[id number]]   rosuvastatin (CRESTOR) 10 MG tablet    Sig: Take 1 tablet (10 mg total) by mouth daily.    Dispense:  90 tablet    Refill:  1    Order Specific Question:    Supervising Provider    Answer:   Quentin Angst [id number]   Semaglutide-Weight Management (WEGOVY) 0.5 MG/0.5ML SOAJ    Sig: Inject 0.5 mg into the skin once a week.    Dispense:  2 mL    Refill:  2    Order Specific Question:   Supervising Provider    Answer:   Quentin Angst [id number]  This note has been created with Education officer, environmental. Any transcriptional errors are unintentional.   Grayce Sessions, NP 03/14/2023, 8:21 PM

## 2023-03-15 ENCOUNTER — Ambulatory Visit (INDEPENDENT_AMBULATORY_CARE_PROVIDER_SITE_OTHER): Payer: 59 | Admitting: Primary Care

## 2023-03-16 DIAGNOSIS — Z6841 Body Mass Index (BMI) 40.0 and over, adult: Secondary | ICD-10-CM | POA: Diagnosis not present

## 2023-03-16 DIAGNOSIS — F330 Major depressive disorder, recurrent, mild: Secondary | ICD-10-CM | POA: Diagnosis not present

## 2023-03-16 DIAGNOSIS — F411 Generalized anxiety disorder: Secondary | ICD-10-CM | POA: Diagnosis not present

## 2023-03-16 DIAGNOSIS — G4733 Obstructive sleep apnea (adult) (pediatric): Secondary | ICD-10-CM | POA: Diagnosis not present

## 2023-03-16 DIAGNOSIS — E66813 Obesity, class 3: Secondary | ICD-10-CM | POA: Diagnosis not present

## 2023-03-24 ENCOUNTER — Other Ambulatory Visit (INDEPENDENT_AMBULATORY_CARE_PROVIDER_SITE_OTHER): Payer: Self-pay | Admitting: Primary Care

## 2023-03-24 DIAGNOSIS — F411 Generalized anxiety disorder: Secondary | ICD-10-CM

## 2023-03-24 DIAGNOSIS — Z760 Encounter for issue of repeat prescription: Secondary | ICD-10-CM

## 2023-03-29 DIAGNOSIS — Z713 Dietary counseling and surveillance: Secondary | ICD-10-CM | POA: Diagnosis not present

## 2023-03-31 ENCOUNTER — Encounter (INDEPENDENT_AMBULATORY_CARE_PROVIDER_SITE_OTHER): Payer: Self-pay | Admitting: Primary Care

## 2023-04-01 ENCOUNTER — Encounter (INDEPENDENT_AMBULATORY_CARE_PROVIDER_SITE_OTHER): Payer: 59 | Admitting: Licensed Clinical Social Worker

## 2023-04-01 ENCOUNTER — Ambulatory Visit (INDEPENDENT_AMBULATORY_CARE_PROVIDER_SITE_OTHER): Payer: 59

## 2023-04-01 VITALS — BP 121/84

## 2023-04-01 DIAGNOSIS — Z6841 Body Mass Index (BMI) 40.0 and over, adult: Secondary | ICD-10-CM | POA: Diagnosis not present

## 2023-04-01 DIAGNOSIS — Z01818 Encounter for other preprocedural examination: Secondary | ICD-10-CM | POA: Diagnosis not present

## 2023-04-01 DIAGNOSIS — Z0130 Encounter for examination of blood pressure without abnormal findings: Secondary | ICD-10-CM

## 2023-04-01 DIAGNOSIS — E66813 Obesity, class 3: Secondary | ICD-10-CM | POA: Diagnosis not present

## 2023-04-01 NOTE — Progress Notes (Signed)
   Blood Pressure Recheck Visit  Name: Catherine [name] MRN: [id number] Date of Birth: 08-02-1970  Catherine [name] presents today for Blood Pressure recheck with clinical support staff.    BP Readings from Last 3 Encounters:  04/01/23 121/84  03/11/23 (!) 129/91  03/09/23 127/87    Current Outpatient Medications  Medication Sig Dispense Refill   acetaminophen (TYLENOL) 500 MG tablet Take 1,000 mg by mouth every 6 (six) hours as needed for moderate pain or headache.     albuterol (VENTOLIN HFA) 108 (90 Base) MCG/ACT inhaler INHALE 2 PUFFS INTO THE LUNGS 4 TIMES A DAY 18 each 1   beclomethasone (QVAR REDIHALER) 80 MCG/ACT inhaler Inhale 1 puff into the lungs 2 (two) times daily. 1 each 2   busPIRone (BUSPAR) 7.5 MG tablet TAKE 1 TABLET BY MOUTH 3 TIMES DAILY. 270 tablet 1   ergocalciferol (VITAMIN D2) 1.25 MG (50000 UT) capsule Take 1 capsule (50,000 Units total) by mouth once a week. 8 capsule 0   escitalopram (LEXAPRO) 20 MG tablet Take 1 tablet (20 mg total) by mouth daily. 90 tablet 1   fluticasone (FLONASE) 50 MCG/ACT nasal spray Place 1 spray into both nostrils daily as needed for allergies. 11.1 mL 3   glimepiride (AMARYL) 2 MG tablet Take 1 tablet (2 mg total) by mouth daily before breakfast. 90 tablet 1   metroNIDAZOLE (FLAGYL) 500 MG tablet Take 1 tablet (500 mg total) by mouth 2 (two) times daily. 14 tablet 0   rosuvastatin (CRESTOR) 10 MG tablet Take 1 tablet (10 mg total) by mouth daily. 90 tablet 1   Semaglutide-Weight Management (WEGOVY) 0.5 MG/0.5ML SOAJ Inject 0.5 mg into the skin once a week. 2 mL 2   valsartan (DIOVAN) 160 MG tablet Take 1 tablet (160 mg total) by mouth daily. 90 tablet 3   No current facility-administered medications for this visit.    Hypertensive Medication Review: Patient states that they are taking all their hypertensive medications as prescribed and their last dose of hypertensive medications was this morning   Documentation of any medication  adherence discrepancies: none  Per pt provider is suppose to be switching her Valsartan to Valsartan HCTZ.   Provider Recommendation:  Spoke to [name] and she stated bp is good. Will send in new rx for valsartan HCTZ   Patient has been given provider's recommendations and does not have any questions or concerns at this time. Patient will contact the office for any future questions or concerns.

## 2023-04-02 MED ORDER — VALSARTAN-HYDROCHLOROTHIAZIDE 160-25 MG PO TABS: 1.0000 | ORAL_TABLET | Freq: Every day | ORAL | 1 refills | Status: DC

## 2023-04-07 DIAGNOSIS — F411 Generalized anxiety disorder: Secondary | ICD-10-CM | POA: Diagnosis not present

## 2023-04-09 ENCOUNTER — Telehealth (INDEPENDENT_AMBULATORY_CARE_PROVIDER_SITE_OTHER): Payer: Self-pay

## 2023-04-09 NOTE — Telephone Encounter (Signed)
Received FMLA paperwork for pt. Per provider since this is an ongoing issue pt will need to be referred to Psychiatry.   Pt has been made aware

## 2023-04-15 DIAGNOSIS — E119 Type 2 diabetes mellitus without complications: Secondary | ICD-10-CM | POA: Diagnosis not present

## 2023-04-15 DIAGNOSIS — Z01818 Encounter for other preprocedural examination: Secondary | ICD-10-CM | POA: Diagnosis not present

## 2023-04-15 DIAGNOSIS — I10 Essential (primary) hypertension: Secondary | ICD-10-CM | POA: Diagnosis not present

## 2023-04-15 DIAGNOSIS — G4733 Obstructive sleep apnea (adult) (pediatric): Secondary | ICD-10-CM | POA: Diagnosis not present

## 2023-04-15 DIAGNOSIS — Z6841 Body Mass Index (BMI) 40.0 and over, adult: Secondary | ICD-10-CM | POA: Diagnosis not present

## 2023-04-19 DIAGNOSIS — Z6841 Body Mass Index (BMI) 40.0 and over, adult: Secondary | ICD-10-CM | POA: Diagnosis not present

## 2023-04-19 DIAGNOSIS — Z713 Dietary counseling and surveillance: Secondary | ICD-10-CM | POA: Diagnosis not present

## 2023-04-25 ENCOUNTER — Encounter (INDEPENDENT_AMBULATORY_CARE_PROVIDER_SITE_OTHER): Payer: Self-pay | Admitting: Primary Care

## 2023-04-25 DIAGNOSIS — J329 Chronic sinusitis, unspecified: Secondary | ICD-10-CM | POA: Insufficient documentation

## 2023-04-30 DIAGNOSIS — Z01818 Encounter for other preprocedural examination: Secondary | ICD-10-CM | POA: Diagnosis not present

## 2023-05-04 ENCOUNTER — Encounter (INDEPENDENT_AMBULATORY_CARE_PROVIDER_SITE_OTHER): Payer: Self-pay | Admitting: Primary Care

## 2023-05-04 DIAGNOSIS — Z713 Dietary counseling and surveillance: Secondary | ICD-10-CM | POA: Diagnosis not present

## 2023-05-05 ENCOUNTER — Other Ambulatory Visit (INDEPENDENT_AMBULATORY_CARE_PROVIDER_SITE_OTHER): Payer: Self-pay | Admitting: Primary Care

## 2023-05-05 MED ORDER — WEGOVY 0.5 MG/0.5ML ~~LOC~~ SOAJ: 1.0000 mg | SUBCUTANEOUS | 2 refills | Status: DC

## 2023-05-13 DIAGNOSIS — E119 Type 2 diabetes mellitus without complications: Secondary | ICD-10-CM | POA: Diagnosis not present

## 2023-05-13 DIAGNOSIS — Z6841 Body Mass Index (BMI) 40.0 and over, adult: Secondary | ICD-10-CM | POA: Diagnosis not present

## 2023-05-13 DIAGNOSIS — G4733 Obstructive sleep apnea (adult) (pediatric): Secondary | ICD-10-CM | POA: Diagnosis not present

## 2023-05-13 DIAGNOSIS — I10 Essential (primary) hypertension: Secondary | ICD-10-CM | POA: Diagnosis not present

## 2023-05-13 DIAGNOSIS — Z713 Dietary counseling and surveillance: Secondary | ICD-10-CM | POA: Diagnosis not present

## 2023-05-14 ENCOUNTER — Telehealth (INDEPENDENT_AMBULATORY_CARE_PROVIDER_SITE_OTHER): Payer: Self-pay | Admitting: Primary Care

## 2023-05-14 ENCOUNTER — Other Ambulatory Visit (INDEPENDENT_AMBULATORY_CARE_PROVIDER_SITE_OTHER): Payer: Self-pay

## 2023-05-14 MED ORDER — SEMAGLUTIDE-WEIGHT MANAGEMENT 1 MG/0.5ML ~~LOC~~ SOAJ: 1.0000 mg | SUBCUTANEOUS | 2 refills | Status: DC

## 2023-05-14 NOTE — Telephone Encounter (Signed)
Pt sent a message to [place]. Said med needed to be sent in and dose is  due Sunday. Pt said wrong strength was sent in again. Pt said she contacted Marcelino Duster again and was still wrong.  Pt needs next dose is this Sunday

## 2023-05-14 NOTE — Telephone Encounter (Signed)
Sent new rx for wegovy.

## 2023-05-14 NOTE — Telephone Encounter (Signed)
Copied from CRM [id number]. Topic: Clinical - Medication Question >> May 14, 2023  1:44 PM Catherine [name] wrote: Reason for CRM: Semaglutide-Weight Management (WEGOVY) 0.5 MG/0.5ML SOAJ was supposed to be 1 mg. She had messaged the dr Debera Lat, and was told this was done. Her dose is due on Sunday

## 2023-05-16 DIAGNOSIS — G4733 Obstructive sleep apnea (adult) (pediatric): Secondary | ICD-10-CM | POA: Diagnosis not present

## 2023-05-17 NOTE — Telephone Encounter (Signed)
New rx was sent on 05/14/23

## 2023-05-17 NOTE — Telephone Encounter (Signed)
Please advise previous request.

## 2023-05-28 ENCOUNTER — Telehealth: Payer: Self-pay

## 2023-05-28 ENCOUNTER — Encounter (INDEPENDENT_AMBULATORY_CARE_PROVIDER_SITE_OTHER): Payer: Self-pay | Admitting: Primary Care

## 2023-05-28 NOTE — Telephone Encounter (Signed)
 Routing to office

## 2023-05-28 NOTE — Telephone Encounter (Signed)
 Copied from CRM [id number]. Topic: Clinical - Medication Refill >> May 14, 2023  1:58 PM Nathanel DEL wrote: Most Recent Primary Care Visit:  Provider: [facility]  Department: [facility]  Visit Type: NURSE VISIT  Date: 04/01/2023  Medication: Semaglutide -Weight Management (WEGOVY ) 0.5 MG/0.5ML SOAJ  Has the patient contacted their pharmacy? Yes (Agent: If no, request that the patient contact the pharmacy for the refill. If patient does not wish to contact the pharmacy document the reason why and proceed with request.) (Agent: If yes, when and what did the pharmacy advise?)  Is this the correct pharmacy for this prescription? Yes If no, delete pharmacy and type the correct one.  This is the patient's preferred pharmacy:  [id number] - [place], [place] - [address] AT [facility] [address] [place] [address] Phone: [phone] Fax: [phone]  [facility] [address] [place] [address] Phone: [phone] Fax: [phone]   Has the prescription been filled recently? Yes  Is the patient out of the medication? Yes  Has the patient been seen for an appointment in the last year OR does the patient have an upcoming appointment? Yes  Can we respond through MyChart? Yes Pt had meesaged Rosaline about this refill. Rosaline said it had been done, but she sent in .05 instead.  Pt states her dose is due Monday.  Please advise.

## 2023-05-28 NOTE — Telephone Encounter (Signed)
 Will forward to provider

## 2023-06-02 NOTE — Telephone Encounter (Signed)
error 

## 2023-07-05 ENCOUNTER — Encounter (INDEPENDENT_AMBULATORY_CARE_PROVIDER_SITE_OTHER): Payer: Self-pay | Admitting: Primary Care

## 2023-07-06 ENCOUNTER — Other Ambulatory Visit (INDEPENDENT_AMBULATORY_CARE_PROVIDER_SITE_OTHER): Payer: Self-pay

## 2023-07-06 ENCOUNTER — Other Ambulatory Visit: Payer: Self-pay | Admitting: Primary Care

## 2023-07-06 ENCOUNTER — Other Ambulatory Visit: Payer: Self-pay | Admitting: Pharmacist

## 2023-07-06 DIAGNOSIS — Z1231 Encounter for screening mammogram for malignant neoplasm of breast: Secondary | ICD-10-CM

## 2023-07-06 DIAGNOSIS — Z760 Encounter for issue of repeat prescription: Secondary | ICD-10-CM

## 2023-07-06 DIAGNOSIS — F411 Generalized anxiety disorder: Secondary | ICD-10-CM

## 2023-07-06 DIAGNOSIS — F418 Other specified anxiety disorders: Secondary | ICD-10-CM

## 2023-07-06 MED ORDER — WEGOVY 1.7 MG/0.75ML ~~LOC~~ SOAJ: 1.7000 mg | SUBCUTANEOUS | 1 refills | Status: DC

## 2023-07-06 MED ORDER — BUSPIRONE HCL 7.5 MG PO TABS: 7.5000 mg | ORAL_TABLET | Freq: Three times a day (TID) | ORAL | 1 refills | Status: DC

## 2023-07-06 MED ORDER — ESCITALOPRAM OXALATE 20 MG PO TABS: 20.0000 mg | ORAL_TABLET | Freq: Every day | ORAL | 1 refills | Status: DC

## 2023-07-06 NOTE — Telephone Encounter (Signed)
 Will forward to provider  [facility] provider is out sick would you be able to help with this?

## 2023-07-07 ENCOUNTER — Other Ambulatory Visit (INDEPENDENT_AMBULATORY_CARE_PROVIDER_SITE_OTHER): Payer: Self-pay | Admitting: Primary Care

## 2023-07-15 ENCOUNTER — Telehealth (INDEPENDENT_AMBULATORY_CARE_PROVIDER_SITE_OTHER): Payer: Self-pay | Admitting: Primary Care

## 2023-07-15 NOTE — Telephone Encounter (Signed)
 Spoke to pt about appt.. Will be present

## 2023-07-22 ENCOUNTER — Ambulatory Visit (INDEPENDENT_AMBULATORY_CARE_PROVIDER_SITE_OTHER): Admitting: Primary Care

## 2023-07-22 ENCOUNTER — Encounter (INDEPENDENT_AMBULATORY_CARE_PROVIDER_SITE_OTHER): Payer: Self-pay | Admitting: Primary Care

## 2023-07-22 VITALS — BP 125/89 | HR 99 | Temp 97.8°F | Resp 16 | Ht 67.0 in | Wt 299.6 lb

## 2023-07-22 DIAGNOSIS — Z0000 Encounter for general adult medical examination without abnormal findings: Secondary | ICD-10-CM

## 2023-07-22 DIAGNOSIS — Z7985 Long-term (current) use of injectable non-insulin antidiabetic drugs: Secondary | ICD-10-CM

## 2023-07-22 DIAGNOSIS — Z7984 Long term (current) use of oral hypoglycemic drugs: Secondary | ICD-10-CM

## 2023-07-22 DIAGNOSIS — E119 Type 2 diabetes mellitus without complications: Secondary | ICD-10-CM

## 2023-07-22 DIAGNOSIS — I10 Essential (primary) hypertension: Secondary | ICD-10-CM | POA: Diagnosis not present

## 2023-07-22 DIAGNOSIS — Z6841 Body Mass Index (BMI) 40.0 and over, adult: Secondary | ICD-10-CM | POA: Diagnosis not present

## 2023-07-22 NOTE — Progress Notes (Unsigned)
 [facility]  Catherine [name] is a 53 y.o. female presents to office today for annual physical exam examination.    Concerns today include: 1. none  Occupation: Benefit service , Marital status: S, Substance use: S Diet: monitoring carbs/ sodium, Exercise: walking  Health Maintenance  Topic Date Due   Pneumococcal Vaccine 35-72 Years old (2 of 2 - PCV) 11/10/2018   OPHTHALMOLOGY EXAM  12/29/2019   Zoster Vaccines- Shingrix (1 of 2) Never done   FOOT EXAM  02/20/2021   COVID-19 Vaccine (3 - 2024-25 season) 12/20/2022   Diabetic kidney evaluation - Urine ACR  01/03/2023   HEMOGLOBIN A1C  04/02/2023   Cervical Cancer Screening (Pap smear)  07/03/2023   MAMMOGRAM  08/14/2023   INFLUENZA VACCINE  11/19/2023   Diabetic kidney evaluation - eGFR measurement  03/08/2024   DTaP/Tdap/Td (2 - Td or Tdap) 11/10/2027   Colonoscopy  01/24/2031   Hepatitis C Screening  Completed   HIV Screening  Completed   HPV VACCINES  Aged Out     Past Medical History:  Diagnosis Date   Allergy    seasonal allergies   Anemia    hx of   Anxiety    on meds   Blood transfusion without reported diagnosis    x 3   Bronchitis    Depression    on meds   Diabetes mellitus without complication (HCC)    on meds   Hyperlipidemia    on meds   Hypertension    on meds   Sleep apnea    no CPAP machine being used at this time (08/19/2020)   Social History   Socioeconomic History   Marital status: Single    Spouse name: Not on file   Number of children: Not on file   Years of education: Not on file   Highest education level: Some college, no degree  Occupational History   Not on file  Tobacco Use   Smoking status: Former    Current packs/day: 0.50    Types: Cigarettes   Smokeless tobacco: Never   Tobacco comments:    3 months no cigarettes  Vaping Use   Vaping status: Never Used  Substance and Sexual Activity   Alcohol use: Yes    Comment: 1-2 times per month   Drug use: No    Sexual activity: Yes    Birth control/protection: Surgical    Comment: 1st intercourse 53 yo-More than 5 partners-HYST  Other Topics Concern   Not on file  Social History Narrative   Not on file   Social Drivers of Health   Financial Resource Strain: Low Risk  (03/11/2023)   Overall Financial Resource Strain (CARDIA)    Difficulty of Paying Living Expenses: Not very hard  Food Insecurity: Food Insecurity Present (03/11/2023)   Hunger Vital Sign    Worried About Running Out of Food in the Last Year: Sometimes true    Ran Out of Food in the Last Year: Sometimes true  Transportation Needs: No Transportation Needs (03/11/2023)   PRAPARE - Administrator, Civil Service (Medical): No    Lack of Transportation (Non-Medical): No  Physical Activity: Insufficiently Active (03/11/2023)   Exercise Vital Sign    Days of Exercise per Week: 1 day    Minutes of Exercise per Session: 20 min  Stress: No Stress Concern Present (03/11/2023)   [employer] of Occupational Health - Occupational Stress Questionnaire    Feeling of Stress : Not at  all  Social Connections: Moderately Integrated (03/11/2023)   Social Connection and Isolation Panel [NHANES]    Frequency of Communication with Friends and Family: Three times a week    Frequency of Social Gatherings with Friends and Family: Twice a week    Attends Religious Services: 1 to 4 times per year    Active Member of [employer] or Organizations: No    Attends Banker Meetings: Never    Marital Status: Living with partner  Intimate Partner Violence: Not on file   Past Surgical History:  Procedure Laterality Date   ABDOMINAL HYSTERECTOMY     BUNIONECTOMY  2012   CESAREAN SECTION     X 2   COLONOSCOPY WITH PROPOFOL N/A 01/23/2021   Procedure: COLONOSCOPY WITH PROPOFOL;  Surgeon: Lynann Bologna, MD;  Location: [facility];  Service: Endoscopy;  Laterality: N/A;   LAPAROSCOPIC VAGINAL HYSTERECTOMY  2007   pt still has  tubes and ovaries   LAVH  2009   Leiomyoma and menorrhagia   MYOMECTOMY  2005   POLYPECTOMY  01/23/2021   Procedure: POLYPECTOMY;  Surgeon: Lynann Bologna, MD;  Location: [facility];  Service: Endoscopy;;   WISDOM TOOTH EXTRACTION     Family History  Problem Relation Age of Onset   Hypertension Mother    Asthma Mother    Colon polyps Mother    Hypertension Father    Heart attack Father        died at 72 of an MI   Hypertension Sister    Asthma Daughter    Colon cancer Neg Hx    Esophageal cancer Neg Hx    Stomach cancer Neg Hx    Rectal cancer Neg Hx     Current Outpatient Medications:    acetaminophen (TYLENOL) 500 MG tablet, Take 1,000 mg by mouth every 6 (six) hours as needed for moderate pain or headache., Disp: , Rfl:    albuterol (VENTOLIN HFA) 108 (90 Base) MCG/ACT inhaler, INHALE 2 PUFFS INTO THE LUNGS 4 TIMES A DAY, Disp: 18 each, Rfl: 1   beclomethasone (QVAR REDIHALER) 80 MCG/ACT inhaler, Inhale 1 puff into the lungs 2 (two) times daily., Disp: 1 each, Rfl: 2   busPIRone (BUSPAR) 7.5 MG tablet, Take 1 tablet (7.5 mg total) by mouth 3 (three) times daily., Disp: 270 tablet, Rfl: 1   ergocalciferol (VITAMIN D2) 1.25 MG (50000 UT) capsule, Take 1 capsule (50,000 Units total) by mouth once a week., Disp: 8 capsule, Rfl: 0   escitalopram (LEXAPRO) 20 MG tablet, Take 1 tablet (20 mg total) by mouth daily., Disp: 90 tablet, Rfl: 1   fluticasone (FLONASE) 50 MCG/ACT nasal spray, PLACE 1 SPRAY INTO BOTH NOSTRILS DAILY AS NEEDED FOR ALLERGIES., Disp: 48 mL, Rfl: 1   glimepiride (AMARYL) 2 MG tablet, Take 1 tablet (2 mg total) by mouth daily before breakfast., Disp: 90 tablet, Rfl: 1   metroNIDAZOLE (FLAGYL) 500 MG tablet, Take 1 tablet (500 mg total) by mouth 2 (two) times daily., Disp: 14 tablet, Rfl: 0   rosuvastatin (CRESTOR) 10 MG tablet, Take 1 tablet (10 mg total) by mouth daily., Disp: 90 tablet, Rfl: 1   Semaglutide-Weight Management (WEGOVY) 1.7 MG/0.75ML SOAJ, Inject  1.7 mg into the skin once a week., Disp: 3 mL, Rfl: 1   valsartan-hydrochlorothiazide (DIOVAN-HCT) 160-25 MG tablet, Take 1 tablet by mouth daily., Disp: 90 tablet, Rfl: 1 Outpatient Encounter Medications as of 07/22/2023  Medication Sig   acetaminophen (TYLENOL) 500 MG tablet Take 1,000 mg by mouth  every 6 (six) hours as needed for moderate pain or headache.   albuterol (VENTOLIN HFA) 108 (90 Base) MCG/ACT inhaler INHALE 2 PUFFS INTO THE LUNGS 4 TIMES A DAY   beclomethasone (QVAR REDIHALER) 80 MCG/ACT inhaler Inhale 1 puff into the lungs 2 (two) times daily.   busPIRone (BUSPAR) 7.5 MG tablet Take 1 tablet (7.5 mg total) by mouth 3 (three) times daily.   ergocalciferol (VITAMIN D2) 1.25 MG (50000 UT) capsule Take 1 capsule (50,000 Units total) by mouth once a week.   escitalopram (LEXAPRO) 20 MG tablet Take 1 tablet (20 mg total) by mouth daily.   fluticasone (FLONASE) 50 MCG/ACT nasal spray PLACE 1 SPRAY INTO BOTH NOSTRILS DAILY AS NEEDED FOR ALLERGIES.   glimepiride (AMARYL) 2 MG tablet Take 1 tablet (2 mg total) by mouth daily before breakfast.   metroNIDAZOLE (FLAGYL) 500 MG tablet Take 1 tablet (500 mg total) by mouth 2 (two) times daily.   rosuvastatin (CRESTOR) 10 MG tablet Take 1 tablet (10 mg total) by mouth daily.   Semaglutide-Weight Management (WEGOVY) 1.7 MG/0.75ML SOAJ Inject 1.7 mg into the skin once a week.   valsartan-hydrochlorothiazide (DIOVAN-HCT) 160-25 MG tablet Take 1 tablet by mouth daily.   No facility-administered encounter medications on file as of 07/22/2023.    Allergies  Allergen Reactions   Metformin And Related Other (See Comments)    Abdominal discomfort.   Latex Rash     ROS: Review of Systems Pertinent items noted in HPI and remainder of comprehensive ROS otherwise negative.    Physical exam Physical exam: General: Vital signs reviewed.  Patient is well-developed and well-nourished,  in no acute distress and cooperative with exam. Head: Normocephalic  and atraumatic. Eyes: EOMI, conjunctivae normal, no scleral icterus. Neck: Supple, trachea midline, normal ROM, no JVD, masses, thyromegaly, or carotid bruit present. Cardiovascular: RRR, S1 normal, S2 normal, no murmurs, gallops, or rubs. Pulmonary/Chest: Clear to auscultation bilaterally, no wheezes, rales, or rhonchi. Abdominal: Soft, non-tender, non-distended, BS +, no masses, organomegaly, or guarding present. Musculoskeletal: No joint deformities, erythema, or stiffness, ROM full and nontender. Extremities: No lower extremity edema bilaterally,  pulses symmetric and intact bilaterally. No cyanosis or clubbing. Neurological: A&O x3, Strength is normal Skin: Warm, dry and intact. No rashes or erythema. Psychiatric: Normal mood and affect. speech and behavior is normal. Cognition and memory are normal.      Assessment/ Plan: Catherine [name] here for annual physical exam.  Catherine [name] was seen today for annual exam.  Diagnoses and all orders for this visit: Catherine [name] was seen today for annual exam.  Diagnoses and all orders for this visit:  Type 2 diabetes mellitus without complication, without long-term current use of insulin (HCC) - educated on lifestyle modifications, including but not limited to diet choices and adding exercise to daily routine.   -     Hemoglobin A1c -     CBC with Differential  Essential hypertension Well controlled  DIET: Limit salt intake, read nutrition labels to check salt content, limit fried and high fatty foods  Avoid using multisymptom OTC cold preparations that generally contain sudafed which can rise BP. Consult with pharmacist on best cold relief products to use for persons with HTN EXERCISE Discussed incorporating exercise such as walking - 30 minutes most days of the week and can do in 10 minute intervals    -     CMP14+EGFR  Morbid obesity (HCC) Manage by [facility]  -     Lipid Panel -  CBC with Differential -     CMP14+EGFR   Counseled on healthy  lifestyle choices, including diet (rich in fruits, vegetables and lean meats and low in salt and simple carbohydrates) and exercise (at least 30 minutes of moderate physical activity daily).  Patient to follow up in 1 year for annual exam or sooner if needed.  The above assessment and management plan was discussed with the patient. The patient verbalized understanding of and has agreed to the management plan. Patient is aware to call the clinic if symptoms persist or worsen. Patient is aware when to return to the clinic for a follow-up visit. Patient educated on when it is appropriate to go to the emergency department.   This note has been created with Education officer, environmental. Any transcriptional errors are unintentional.   Grayce Sessions, NP 07/22/2023, 9:02 AM

## 2023-07-23 LAB — CMP14+EGFR
ALT: 15 IU/L (ref 0–32)
AST: 17 IU/L (ref 0–40)
Albumin: 4.6 g/dL (ref 3.8–4.9)
Alkaline Phosphatase: 77 IU/L (ref 44–121)
BUN/Creatinine Ratio: 16 (ref 9–23)
BUN: 15 mg/dL (ref 6–24)
Bilirubin Total: 0.5 mg/dL (ref 0.0–1.2)
CO2: 26 mmol/L (ref 20–29)
Calcium: 9.9 mg/dL (ref 8.7–10.2)
Chloride: 98 mmol/L (ref 96–106)
Creatinine, Ser: 0.96 mg/dL (ref 0.57–1.00)
Globulin, Total: 2.8 g/dL (ref 1.5–4.5)
Glucose: 92 mg/dL (ref 70–99)
Potassium: 4.4 mmol/L (ref 3.5–5.2)
Sodium: 139 mmol/L (ref 134–144)
Total Protein: 7.4 g/dL (ref 6.0–8.5)
eGFR: 71 mL/min/{1.73_m2} (ref >59)

## 2023-07-23 LAB — LIPID PANEL
Chol/HDL Ratio: 3.4 ratio (ref 0.0–4.4)
Cholesterol, Total: 189 mg/dL (ref 100–199)
HDL: 56 mg/dL (ref >39)
LDL Chol Calc (NIH): 119 mg/dL — ABNORMAL HIGH (ref 0–99)
Triglycerides: 74 mg/dL (ref 0–149)
VLDL Cholesterol Cal: 14 mg/dL (ref 5–40)

## 2023-07-23 LAB — CBC WITH DIFFERENTIAL/PLATELET
Basophils Absolute: 0.1 10*3/uL (ref 0.0–0.2)
Basos: 2 %
EOS (ABSOLUTE): 0.5 10*3/uL — ABNORMAL HIGH (ref 0.0–0.4)
Eos: 8 %
Hematocrit: 43.5 % (ref 34.0–46.6)
Hemoglobin: 13.9 g/dL (ref 11.1–15.9)
Immature Grans (Abs): 0 10*3/uL (ref 0.0–0.1)
Immature Granulocytes: 0 %
Lymphocytes Absolute: 3.4 10*3/uL — ABNORMAL HIGH (ref 0.7–3.1)
Lymphs: 50 %
MCH: 29.9 pg (ref 26.6–33.0)
MCHC: 32 g/dL (ref 31.5–35.7)
MCV: 94 fL (ref 79–97)
Monocytes Absolute: 0.4 10*3/uL (ref 0.1–0.9)
Monocytes: 6 %
Neutrophils Absolute: 2.2 10*3/uL (ref 1.4–7.0)
Neutrophils: 34 %
Platelets: 283 10*3/uL (ref 150–450)
RBC: 4.65 x10E6/uL (ref 3.77–5.28)
RDW: 13.6 % (ref 11.7–15.4)
WBC: 6.6 10*3/uL (ref 3.4–10.8)

## 2023-07-23 LAB — MICROALBUMIN / CREATININE URINE RATIO
Creatinine, Urine: 266.3 mg/dL
Microalb/Creat Ratio: 7 mg/g{creat} (ref 0–29)
Microalbumin, Urine: 18.5 ug/mL

## 2023-07-23 LAB — HEMOGLOBIN A1C
Est. average glucose Bld gHb Est-mCnc: 137 mg/dL
Hgb A1c MFr Bld: 6.4 % — ABNORMAL HIGH (ref 4.8–5.6)

## 2023-07-28 ENCOUNTER — Other Ambulatory Visit (INDEPENDENT_AMBULATORY_CARE_PROVIDER_SITE_OTHER): Payer: Self-pay | Admitting: Primary Care

## 2023-07-28 ENCOUNTER — Encounter (INDEPENDENT_AMBULATORY_CARE_PROVIDER_SITE_OTHER): Payer: Self-pay | Admitting: Primary Care

## 2023-07-28 MED ORDER — ROSUVASTATIN CALCIUM 10 MG PO TABS: 10.0000 mg | ORAL_TABLET | Freq: Every day | ORAL | 1 refills | Status: DC

## 2023-07-29 ENCOUNTER — Encounter (INDEPENDENT_AMBULATORY_CARE_PROVIDER_SITE_OTHER): Payer: Self-pay | Admitting: Primary Care

## 2023-07-30 NOTE — Telephone Encounter (Signed)
 Will forward to provider

## 2023-08-02 ENCOUNTER — Other Ambulatory Visit (INDEPENDENT_AMBULATORY_CARE_PROVIDER_SITE_OTHER): Payer: Self-pay | Admitting: Primary Care

## 2023-08-02 DIAGNOSIS — Z8709 Personal history of other diseases of the respiratory system: Secondary | ICD-10-CM

## 2023-08-02 DIAGNOSIS — Z0130 Encounter for examination of blood pressure without abnormal findings: Secondary | ICD-10-CM

## 2023-08-02 DIAGNOSIS — Z760 Encounter for issue of repeat prescription: Secondary | ICD-10-CM

## 2023-08-02 DIAGNOSIS — E119 Type 2 diabetes mellitus without complications: Secondary | ICD-10-CM

## 2023-08-02 MED ORDER — VALSARTAN-HYDROCHLOROTHIAZIDE 160-25 MG PO TABS: 1.0000 | ORAL_TABLET | Freq: Every day | ORAL | 1 refills | Status: DC

## 2023-08-02 MED ORDER — ALBUTEROL SULFATE HFA 108 (90 BASE) MCG/ACT IN AERS: INHALATION_SPRAY | RESPIRATORY_TRACT | 1 refills | Status: DC

## 2023-08-02 MED ORDER — QVAR REDIHALER 80 MCG/ACT IN AERB: 1.0000 | INHALATION_SPRAY | Freq: Two times a day (BID) | RESPIRATORY_TRACT | 2 refills | Status: DC

## 2023-08-02 MED ORDER — GLIMEPIRIDE 2 MG PO TABS: 2.0000 mg | ORAL_TABLET | Freq: Every day | ORAL | 1 refills | Status: DC

## 2023-08-02 NOTE — Telephone Encounter (Signed)
 Will forward to provider

## 2023-08-04 ENCOUNTER — Other Ambulatory Visit: Payer: Self-pay | Admitting: Pharmacist

## 2023-08-04 ENCOUNTER — Other Ambulatory Visit: Payer: Self-pay

## 2023-08-04 MED ORDER — WEGOVY 2.4 MG/0.75ML ~~LOC~~ SOAJ: 2.4000 mg | SUBCUTANEOUS | 6 refills | Status: AC

## 2023-08-17 ENCOUNTER — Ambulatory Visit

## 2023-08-18 DIAGNOSIS — Z01818 Encounter for other preprocedural examination: Secondary | ICD-10-CM | POA: Diagnosis not present

## 2023-08-25 ENCOUNTER — Encounter (HOSPITAL_COMMUNITY): Payer: Self-pay

## 2023-09-01 ENCOUNTER — Ambulatory Visit
Admission: RE | Admit: 2023-09-01 | Discharge: 2023-09-01 | Disposition: A | Discharge status: Home / Self Care | Source: Ambulatory Visit | Attending: Primary Care | Admitting: Primary Care

## 2023-09-01 DIAGNOSIS — Z1231 Encounter for screening mammogram for malignant neoplasm of breast: Secondary | ICD-10-CM | POA: Diagnosis not present

## 2023-09-06 DIAGNOSIS — K660 Peritoneal adhesions (postprocedural) (postinfection): Secondary | ICD-10-CM | POA: Diagnosis not present

## 2023-09-06 DIAGNOSIS — Z6841 Body Mass Index (BMI) 40.0 and over, adult: Secondary | ICD-10-CM | POA: Diagnosis not present

## 2023-09-06 DIAGNOSIS — Z9884 Bariatric surgery status: Secondary | ICD-10-CM | POA: Diagnosis not present

## 2023-09-06 DIAGNOSIS — F418 Other specified anxiety disorders: Secondary | ICD-10-CM | POA: Diagnosis not present

## 2023-09-06 DIAGNOSIS — I10 Essential (primary) hypertension: Secondary | ICD-10-CM | POA: Diagnosis not present

## 2023-09-06 DIAGNOSIS — K449 Diaphragmatic hernia without obstruction or gangrene: Secondary | ICD-10-CM | POA: Diagnosis not present

## 2023-09-06 DIAGNOSIS — R7303 Prediabetes: Secondary | ICD-10-CM | POA: Diagnosis not present

## 2023-09-23 ENCOUNTER — Other Ambulatory Visit: Payer: Self-pay

## 2023-09-23 ENCOUNTER — Encounter: Payer: Self-pay | Admitting: Emergency Medicine

## 2023-09-23 ENCOUNTER — Emergency Department
Admission: EM | Admit: 2023-09-23 | Discharge: 2023-09-23 | Disposition: A | Discharge status: Home / Self Care | Attending: Emergency Medicine | Admitting: Emergency Medicine

## 2023-09-23 DIAGNOSIS — K920 Hematemesis: Secondary | ICD-10-CM | POA: Insufficient documentation

## 2023-09-23 LAB — COMPREHENSIVE METABOLIC PANEL WITH GFR
ALT: 70 U/L — ABNORMAL HIGH (ref 0–44)
AST: 37 U/L (ref 15–41)
Albumin: 4.2 g/dL (ref 3.5–5.0)
Alkaline Phosphatase: 86 U/L (ref 38–126)
Anion gap: 8 (ref 5–15)
BUN: 15 mg/dL (ref 6–20)
CO2: 27 mmol/L (ref 22–32)
Calcium: 9.3 mg/dL (ref 8.9–10.3)
Chloride: 105 mmol/L (ref 98–111)
Creatinine, Ser: 0.76 mg/dL (ref 0.44–1.00)
GFR, Estimated: >60 mL/min (ref >60)
Glucose, Bld: 91 mg/dL (ref 70–99)
Potassium: 3.7 mmol/L (ref 3.5–5.1)
Sodium: 140 mmol/L (ref 135–145)
Total Bilirubin: 1.4 mg/dL — ABNORMAL HIGH (ref 0.0–1.2)
Total Protein: 7.5 g/dL (ref 6.5–8.1)

## 2023-09-23 LAB — CBC WITH DIFFERENTIAL/PLATELET
Abs Immature Granulocytes: 0.01 10*3/uL (ref 0.00–0.07)
Basophils Absolute: 0.1 10*3/uL (ref 0.0–0.1)
Basophils Relative: 2 %
Eosinophils Absolute: 0.9 10*3/uL — ABNORMAL HIGH (ref 0.0–0.5)
Eosinophils Relative: 12 %
HCT: 41 % (ref 36.0–46.0)
Hemoglobin: 13.2 g/dL (ref 12.0–15.0)
Immature Granulocytes: 0 %
Lymphocytes Relative: 46 %
Lymphs Abs: 3.3 10*3/uL (ref 0.7–4.0)
MCH: 29.7 pg (ref 26.0–34.0)
MCHC: 32.2 g/dL (ref 30.0–36.0)
MCV: 92.1 fL (ref 80.0–100.0)
Monocytes Absolute: 0.5 10*3/uL (ref 0.1–1.0)
Monocytes Relative: 8 %
Neutro Abs: 2.3 10*3/uL (ref 1.7–7.7)
Neutrophils Relative %: 32 %
Platelets: 246 10*3/uL (ref 150–400)
RBC: 4.45 MIL/uL (ref 3.87–5.11)
RDW: 14.6 % (ref 11.5–15.5)
WBC: 7.1 10*3/uL (ref 4.0–10.5)
nRBC: 0 % (ref 0.0–0.2)

## 2023-09-23 LAB — LIPASE, BLOOD: Lipase: 51 U/L (ref 11–51)

## 2023-09-23 NOTE — ED Triage Notes (Signed)
 Pt here with a post op problem. Pt had weight loss surgery on  May 19th and woke up with nausea and dry heaving. Pt denies abd pain. Pt denies fever as well. Pt last bowel movement was this morning.

## 2023-09-23 NOTE — ED Notes (Signed)
 See triage note  Presents with some nausea this am   Then states she vomited a small amt of blood this am  Had wt loss surgery May 19 along with hernia repair  No fever

## 2023-09-23 NOTE — Discharge Instructions (Addendum)
 Your blood work was reassuring today.  Please watch for more bloody vomit, coffee ground appearance of vomit, dark tarry stools or bright red blood in your stools.  If you develop these or have abdominal plain please return to the emergency department.  Begin taking the omeprazole once a day in the morning.  This will help reduce the acid in your stomach.  Please reach out to the surgical team to see if they want you to follow-up sooner.

## 2023-09-23 NOTE — ED Provider Notes (Signed)
 [facility] Provider Note    Event Date/Time   First MD Initiated Contact with Patient 09/23/23 1054     (approximate)   History   Post-op Problem   HPI  MADALEN GAVIN is a 53 y.o. 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 Patient denies abdominal pain and diarrhea. She has been doing well post op up to this point and has not needed any pain medication including nsaids.      Physical Exam   Triage Vital Signs: ED Triage Vitals  Encounter Vitals Group     BP 09/23/23 1008 (!) 136/90     Systolic BP Percentile --      Diastolic BP Percentile --      Pulse Rate 09/23/23 1007 81     Resp 09/23/23 1007 17     Temp 09/23/23 1007 98.8 F (37.1 C)     Temp Source 09/23/23 1007 Oral     SpO2 09/23/23 1007 97 %     Weight 09/23/23 1009 299 lb 9.7 oz (135.9 kg)     Height 09/23/23 1009 5\' 7"  (1.702 m)     Head Circumference --      Peak Flow --      Pain Score 09/23/23 1008 0     Pain Loc --      Pain Education --      Exclude from Growth Chart --     Most recent vital signs: Vitals:   09/23/23 1007 09/23/23 1008  BP:  (!) 136/90  Pulse: 81   Resp: 17   Temp: 98.8 F (37.1 C)   SpO2: 97%    General: Awake, no distress.   CV:  Good peripheral perfusion. RRR. Resp:  Normal effort. CTAB. Abd:  No distention. Soft, non tender to palpation. Well healing surgical scars. Other:     ED Results / Procedures / Treatments   Labs (all labs ordered are listed, but only abnormal results are displayed) Labs Reviewed  COMPREHENSIVE METABOLIC PANEL WITH GFR - Abnormal; Notable for the following components:      Result Value   ALT 70 (*)    Total Bilirubin 1.4 (*)    All other components within normal limits  CBC WITH DIFFERENTIAL/PLATELET - Abnormal; Notable for the following components:   Eosinophils Absolute 0.9 (*)    All other  components within normal limits  LIPASE, BLOOD  URINALYSIS, ROUTINE W REFLEX MICROSCOPIC    PROCEDURES:  Critical Care performed: No  Procedures   MEDICATIONS ORDERED IN ED: Medications - No data to display   IMPRESSION / MDM / ASSESSMENT AND PLAN / ED COURSE  I reviewed the triage vital signs and the nursing notes.                             53 year old female presents for evaluation of bloody emesis with recent gastric bypass surgery.  Blood pressure slightly elevated otherwise vital signs are stable.  Patient NAD on exam.  Differential diagnosis includes, but is not limited to, bowel obstruction, stomach ulcer, bowel perforation, pancreatitis, upper GI bleed.  Patient's presentation is most consistent with acute complicated illness / injury requiring diagnostic workup.  CBC shows normal H&H.  CMP shows mildly elevated ALT and total bilirubin otherwise normal.  Lipase within normal limits.  Physical exam is reassuring and that  patient does not have any tenderness to palpation.   Offered patient nausea medication but she declined stating she felt okay at this time.  Will p.o. challenge patient and if she does not have any further vomiting I feel she be stable for outpatient management.  Patient is able to tolerate p.o.  Do not feel that CT scan is indicated at this time as patient's lab work is reassuring and she does not have any tenderness on exam.  Very low suspicion for bowel obstruction and bowel perforation.  Lipase was normal so do not suspect pancreatitis.  Unsure of exactly what caused the episode of bloody emesis, suspect possible stomach ulcer.  Recommended that patient take the omeprazole that was prescribed by her surgeon.  Feel she stable for discharge at this point.  Recommended that she reach out to her surgeon to discuss if she needs to follow-up sooner than her previously scheduled postop appointment.  Reviewed return precautions with the patient.  She voiced  understanding, all questions were answered and she was stable at discharge.     FINAL CLINICAL IMPRESSION(S) / ED DIAGNOSES   Final diagnoses:  Hematemesis with nausea     Rx / DC Orders   ED Discharge Orders     None        Note:  This document was prepared using Dragon voice recognition software and may include unintentional dictation errors.   Phyliss Breen, PA-C 09/23/23 1533    Charleen Conn, MD 09/23/23 [phone]

## 2023-09-23 NOTE — ED Notes (Signed)
 Pt ambulated to toilet in the hallway without any issue. Pt tolerated activity well.

## 2023-09-30 ENCOUNTER — Emergency Department
Admission: EM | Admit: 2023-09-30 | Discharge: 2023-09-30 | Disposition: A | Discharge status: Home / Self Care | Attending: Emergency Medicine | Admitting: Emergency Medicine

## 2023-09-30 ENCOUNTER — Other Ambulatory Visit: Payer: Self-pay

## 2023-09-30 ENCOUNTER — Emergency Department

## 2023-09-30 DIAGNOSIS — E119 Type 2 diabetes mellitus without complications: Secondary | ICD-10-CM | POA: Diagnosis not present

## 2023-09-30 DIAGNOSIS — R0602 Shortness of breath: Secondary | ICD-10-CM | POA: Insufficient documentation

## 2023-09-30 DIAGNOSIS — J189 Pneumonia, unspecified organism: Secondary | ICD-10-CM | POA: Insufficient documentation

## 2023-09-30 DIAGNOSIS — R079 Chest pain, unspecified: Secondary | ICD-10-CM

## 2023-09-30 LAB — COMPREHENSIVE METABOLIC PANEL WITH GFR
ALT: 36 U/L (ref 0–44)
AST: 29 U/L (ref 15–41)
Albumin: 4.3 g/dL (ref 3.5–5.0)
Alkaline Phosphatase: 68 U/L (ref 38–126)
Anion gap: 10 (ref 5–15)
BUN: 17 mg/dL (ref 6–20)
CO2: 24 mmol/L (ref 22–32)
Calcium: 9.3 mg/dL (ref 8.9–10.3)
Chloride: 105 mmol/L (ref 98–111)
Creatinine, Ser: 0.76 mg/dL (ref 0.44–1.00)
GFR, Estimated: >60 mL/min (ref >60)
Glucose, Bld: 89 mg/dL (ref 70–99)
Potassium: 3.8 mmol/L (ref 3.5–5.1)
Sodium: 139 mmol/L (ref 135–145)
Total Bilirubin: 1.3 mg/dL — ABNORMAL HIGH (ref 0.0–1.2)
Total Protein: 7.9 g/dL (ref 6.5–8.1)

## 2023-09-30 LAB — CBC
HCT: 41.5 % (ref 36.0–46.0)
Hemoglobin: 13.1 g/dL (ref 12.0–15.0)
MCH: 29.3 pg (ref 26.0–34.0)
MCHC: 31.6 g/dL (ref 30.0–36.0)
MCV: 92.8 fL (ref 80.0–100.0)
Platelets: 191 10*3/uL (ref 150–400)
RBC: 4.47 MIL/uL (ref 3.87–5.11)
RDW: 15 % (ref 11.5–15.5)
WBC: 7.6 10*3/uL (ref 4.0–10.5)
nRBC: 0 % (ref 0.0–0.2)

## 2023-09-30 LAB — LIPASE, BLOOD: Lipase: 56 U/L — ABNORMAL HIGH (ref 11–51)

## 2023-09-30 LAB — BRAIN NATRIURETIC PEPTIDE: B Natriuretic Peptide: 6.7 pg/mL (ref 0.0–100.0)

## 2023-09-30 LAB — TROPONIN I (HIGH SENSITIVITY): Troponin I (High Sensitivity): 4 ng/L (ref <18)

## 2023-09-30 MED ORDER — ALBUTEROL SULFATE HFA 108 (90 BASE) MCG/ACT IN AERS: 2.0000 | INHALATION_SPRAY | Freq: Four times a day (QID) | RESPIRATORY_TRACT | 2 refills | Status: DC | PRN

## 2023-09-30 MED ORDER — DEXAMETHASONE 6 MG PO TABS
10.0000 mg | ORAL_TABLET | Freq: Once | ORAL | Status: AC
  Administered 2023-09-30: 10 mg via ORAL
  Filled 2023-09-30: qty 1

## 2023-09-30 MED ORDER — AMOXICILLIN-POT CLAVULANATE 875-125 MG PO TABS: 1.0000 | ORAL_TABLET | Freq: Two times a day (BID) | ORAL | 0 refills | Status: AC

## 2023-09-30 MED ORDER — ACETAMINOPHEN 500 MG PO TABS: 1000.0000 mg | ORAL_TABLET | Freq: Once | ORAL | Status: DC

## 2023-09-30 NOTE — ED Triage Notes (Signed)
 Pt to ED via POV from home. Pt ambulatory to triage. Pt reports chest heaviness and SOB x2-3 days. Pt reports SOB and pressure is worse when trying to sleep.   Pt has weight loss surgery on 09/06/23.

## 2023-09-30 NOTE — ED Notes (Signed)
Pt transported to XR at this time.

## 2023-09-30 NOTE — Discharge Instructions (Addendum)
 You are seen in the emergency department for chest pain.  Do not believe you are having a heart attack today, your heart enzymes and EKG were normal.  Your chest x-ray did not show any signs of pneumonia.  You did have some chronic interstitial findings on your chest x-ray, follow this up with your primary care provider.  You did not have any findings of heart failure on her exam today.  It is importantly follow-up closely with your primary care doctor.  Return to the emergency department if you have any ongoing or worsening symptoms.

## 2023-09-30 NOTE — ED Notes (Signed)
 Pt via POV from home, pt c/o centralized, non-radiating CP. Reports some rattling in my chest. Symptoms started 2-3 days ago. Denies any cardiac hx. Pt had recent weight loss surgery. Denies sick contacts. Denies fevers. Pt is A&Ox4 and NAD, ambulatory to triage.

## 2023-09-30 NOTE — ED Notes (Signed)
 Dr. Dodson Freestone MD at bedside for evaluation.

## 2023-09-30 NOTE — ED Provider Notes (Addendum)
 [facility] Provider Note    Event Date/Time   First MD Initiated Contact with Patient 09/30/23 [phone]     (approximate)   History   Chest Pain and Shortness of Breath   HPI  Catherine [name] is a 53 y.o. 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  Patient states that she had an episode last week with 1 episode of vomiting blood.  Evaluated in the emergency department at that time and was ultimately discharged home.  No further episodes since that time.  Laying in bed last night and felt like she was having chest tightness and pressure sensation.  Felt like her legs were swelling and some mild shortness of breath.  Some improvement this morning.  Denies nausea, vomiting or diaphoresis.  Does endorse family history of coronary artery disease at a young age.  Denies any history of DVT or PE.  States that she is currently doing Lovenox injections to prevent blood clots.  Denies any history of blood clots.     Physical Exam   Triage Vital Signs: ED Triage Vitals  Encounter Vitals Group     BP 09/30/23 0710 127/88     Girls Systolic BP Percentile --      Girls Diastolic BP Percentile --      Boys Systolic BP Percentile --      Boys Diastolic BP Percentile --      Pulse Rate 09/30/23 0710 84     Resp 09/30/23 0710 18     Temp 09/30/23 0710 98.5 F (36.9 C)     Temp Source 09/30/23 0710 Oral     SpO2 09/30/23 0710 97 %     Weight --      Height --      Head Circumference --      Peak Flow --      Pain Score 09/30/23 0711 3     Pain Loc --      Pain Education --      Exclude from Growth Chart --     Most recent vital signs: Vitals:   09/30/23 0710 09/30/23 0930  BP: 127/88 116/75  Pulse: 84 73  Resp: 18 12  Temp: 98.5 F (36.9 C)   SpO2: 97% 100%    Physical Exam Constitutional:      Appearance: She is well-developed.  HENT:      Head: Atraumatic.   Eyes:     Conjunctiva/sclera: Conjunctivae normal.    Cardiovascular:     Rate and Rhythm: Regular rhythm.     Heart sounds: Normal heart sounds.  Pulmonary:     Effort: Pulmonary effort is normal. No respiratory distress.  Abdominal:     General: There is no distension.     Palpations: Abdomen is soft.     Tenderness: There is no abdominal tenderness.   Musculoskeletal:        General: Normal range of motion.     Cervical back: Normal range of motion.     Right lower leg: No tenderness. No edema.     Left lower leg: No tenderness. No edema.   Skin:    General: Skin is warm.     Capillary Refill: Capillary refill takes less than 2 seconds.   Neurological:     General: No focal deficit present.     Mental Status: She is alert. Mental status is at baseline.  IMPRESSION / MDM / ASSESSMENT AND PLAN / ED COURSE  I reviewed the triage vital signs and the nursing notes.  Differential diagnosis including ACS, anemia, gastritis/PUD.  Low suspicion for dissection, no tearing chest pain and equal symmetric pulses.  Low suspicion for pulmonary embolism, low risk Wells criteria, no tachycardia, normal oxygenation, no pleuritic chest pain and currently on preventative medication.  Low suspicion for pericarditis, no rub, no positional changes and no EKG changes.   EKG  I, Viviano Ground, the attending physician, personally viewed and interpreted this ECG.   Rate: Normal  Rhythm: Normal sinus  Axis: Normal  Intervals: Normal  ST&T Change: None  No tachycardic or bradycardic dysrhythmias while on cardiac telemetry.  RADIOLOGY I independently reviewed imaging, my interpretation of imaging: Chest x-ray with no obvious cardiomegaly, no pneumothorax no obvious pulmonary edema.  Read as chronic interstitial coarsening without acute cardiopulmonary findings.  LABS (all labs ordered are listed, but only abnormal results are displayed) Labs interpreted as -     Labs Reviewed  COMPREHENSIVE METABOLIC PANEL WITH GFR - Abnormal; Notable for the following components:      Result Value   Total Bilirubin 1.3 (*)    All other components within normal limits  LIPASE, BLOOD - Abnormal; Notable for the following components:   Lipase 56 (*)    All other components within normal limits  CBC  BRAIN NATRIURETIC PEPTIDE  TROPONIN I (HIGH SENSITIVITY)     MDM  On reevaluation patient states that she is feeling better.  Able to sleep and resting comfortably.  Troponin was negative at 4.  EKG without findings of acute ischemia or dysrhythmia.  Low risk heart score.  Given that her symptoms have been ongoing more than 6 hours do not feel that serial troponins are necessary at this time.  Abdomen is nontender to palpation.  No rebound or guarding and no concern for small bowel obstruction.  Possible gastritis/peptic ulcer disease in the setting of her recent surgery.  Patient did have chronic changes on her chest x-ray and discussed following up with her primary care physician for these findings.  Discussed return to the emergency department for any ongoing or worsening symptoms.  Have a low suspicion for pulmonary embolism.  No shortness of breath at this time.    Does endorse a new cough.  Later states that she has a history of asthma and has used inhalers in the past.  Will refill her albuterol  inhaler.  Will do a course of antibiotics for asthma exacerbation.  Given 1 dose of Decadron in the emergency department.     PROCEDURES:  Critical Care performed: No  .Ultrasound ED Peripheral IV (Provider)  Date/Time: 09/30/2023 8:28 AM  Performed by: Viviano Ground, MD Authorized by: Viviano Ground, MD   Procedure details:    Indications: multiple failed IV attempts     Skin Prep: chlorhexidine gluconate     Location:  Right AC   Angiocath:  20 G   Bedside Ultrasound Guided: Yes     Images: not archived     Patient tolerated procedure without  complications: Yes     Dressing applied: Yes     Patient's presentation is most consistent with acute presentation with potential threat to life or bodily function.   MEDICATIONS ORDERED IN ED: Medications  dexamethasone (DECADRON) tablet 10 mg (has no administration in time range)    FINAL CLINICAL IMPRESSION(S) / ED DIAGNOSES   Final diagnoses:  Chest pain, unspecified type  Atypical  pneumonia     Rx / DC Orders   ED Discharge Orders          Ordered    Ambulatory Referral to Primary Care (Establish Care)        09/30/23 1021    amoxicillin-clavulanate (AUGMENTIN) 875-125 MG tablet  2 times daily        09/30/23 1030    albuterol  (VENTOLIN  HFA) 108 (90 Base) MCG/ACT inhaler  Every 6 hours PRN        09/30/23 1030             Note:  This document was prepared using Dragon voice recognition software and may include unintentional dictation errors.   Viviano Ground, MD 09/30/23 1023    Viviano Ground, MD 09/30/23 1030

## 2023-10-25 ENCOUNTER — Encounter (INDEPENDENT_AMBULATORY_CARE_PROVIDER_SITE_OTHER): Payer: Self-pay | Admitting: Primary Care

## 2023-11-03 NOTE — Telephone Encounter (Signed)
 Reached out to pt and made aware that form is ready for pick. Pt doesn't have any questions or concerns

## 2023-11-24 ENCOUNTER — Emergency Department
Admission: EM | Admit: 2023-11-24 | Discharge: 2023-11-24 | Disposition: A | Discharge status: Home / Self Care | Attending: Emergency Medicine | Admitting: Emergency Medicine

## 2023-11-24 ENCOUNTER — Other Ambulatory Visit: Payer: Self-pay

## 2023-11-24 DIAGNOSIS — Z79899 Other long term (current) drug therapy: Secondary | ICD-10-CM | POA: Diagnosis not present

## 2023-11-24 DIAGNOSIS — R519 Headache, unspecified: Secondary | ICD-10-CM

## 2023-11-24 DIAGNOSIS — I10 Essential (primary) hypertension: Secondary | ICD-10-CM | POA: Insufficient documentation

## 2023-11-24 DIAGNOSIS — E119 Type 2 diabetes mellitus without complications: Secondary | ICD-10-CM | POA: Insufficient documentation

## 2023-11-24 LAB — CBC WITH DIFFERENTIAL/PLATELET
Abs Immature Granulocytes: 0.01 K/uL (ref 0.00–0.07)
Basophils Absolute: 0.1 K/uL (ref 0.0–0.1)
Basophils Relative: 1 %
Eosinophils Absolute: 0.7 K/uL — ABNORMAL HIGH (ref 0.0–0.5)
Eosinophils Relative: 9 %
HCT: 37.7 % (ref 36.0–46.0)
Hemoglobin: 11.6 g/dL — ABNORMAL LOW (ref 12.0–15.0)
Immature Granulocytes: 0 %
Lymphocytes Relative: 49 %
Lymphs Abs: 3.7 K/uL (ref 0.7–4.0)
MCH: 28.8 pg (ref 26.0–34.0)
MCHC: 30.8 g/dL (ref 30.0–36.0)
MCV: 93.5 fL (ref 80.0–100.0)
Monocytes Absolute: 0.6 K/uL (ref 0.1–1.0)
Monocytes Relative: 9 %
Neutro Abs: 2.4 K/uL (ref 1.7–7.7)
Neutrophils Relative %: 32 %
Platelets: 196 K/uL (ref 150–400)
RBC: 4.03 MIL/uL (ref 3.87–5.11)
RDW: 14.3 % (ref 11.5–15.5)
WBC: 7.5 K/uL (ref 4.0–10.5)
nRBC: 0 % (ref 0.0–0.2)

## 2023-11-24 LAB — BASIC METABOLIC PANEL WITH GFR
Anion gap: 9 (ref 5–15)
BUN: 13 mg/dL (ref 6–20)
CO2: 26 mmol/L (ref 22–32)
Calcium: 9.1 mg/dL (ref 8.9–10.3)
Chloride: 106 mmol/L (ref 98–111)
Creatinine, Ser: 0.78 mg/dL (ref 0.44–1.00)
GFR, Estimated: >60 mL/min (ref >60)
Glucose, Bld: 102 mg/dL — ABNORMAL HIGH (ref 70–99)
Potassium: 3.7 mmol/L (ref 3.5–5.1)
Sodium: 141 mmol/L (ref 135–145)

## 2023-11-24 MED ORDER — CLONIDINE HCL 0.1 MG PO TABS
0.2000 mg | ORAL_TABLET | Freq: Once | ORAL | Status: AC
  Administered 2023-11-24: 0.2 mg via ORAL
  Filled 2023-11-24: qty 2

## 2023-11-24 NOTE — ED Triage Notes (Signed)
 Pt reports headache and HTN, pt reports she has been off HTN meds since may after weight los surgery. Pt denies chest pain

## 2023-11-24 NOTE — ED Provider Notes (Signed)
 [facility] Provider Note    Event Date/Time   First MD Initiated Contact with Patient 11/24/23 [phone]     (approximate)   History   Hypertension   HPI  Catherine [name] is a 53 y.o. 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  The patient states that she previously was on valsartan -hydrochlorothiazide  but her blood pressure improved and she was switched to just losartan  after her gastric surgery.  However she only takes it if needed when her blood pressure is elevated.  She does not take it daily.  She did take it last night.  She states that her blood pressure has been elevated over the last couple of days.  She reports a vague sensation of not feeling right.  She also has a bilateral frontal headache which has been intermittent over the last day.  She denies any chest pain.  She has no leg swelling.  I reviewed the past medical records.  The patient's most recent outpatient encounter was for follow-up of her gastric surgery on 6/25 and she had no acute issues at that time.     Physical Exam   Triage Vital Signs: ED Triage Vitals  Encounter Vitals Group     BP 11/24/23 0336 (!) 162/96     Girls Systolic BP Percentile --      Girls Diastolic BP Percentile --      Boys Systolic BP Percentile --      Boys Diastolic BP Percentile --      Pulse Rate 11/24/23 0336 66     Resp 11/24/23 0336 18     Temp 11/24/23 0336 98.2 F (36.8 C)     Temp src --      SpO2 11/24/23 0336 100 %     Weight 11/24/23 0334 263 lb (119.3 kg)     Height 11/24/23 0334 5' 7 (1.702 m)     Head Circumference --      Peak Flow --      Pain Score 11/24/23 0334 0     Pain Loc --      Pain Education --      Exclude from Growth Chart --     Most recent vital signs: Vitals:   11/24/23 0500 11/24/23 0530  BP: (!) 157/96 (!) 150/96  Pulse: 62 63  Resp: 17 20   Temp:    SpO2: 100% 99%     General: Awake, no distress.  CV:  Good peripheral perfusion.  Resp:  Normal effort.  Abd:  No distention.  Other:  Motor intact in all extremities.  No peripheral edema.   ED Results / Procedures / Treatments   Labs (all labs ordered are listed, but only abnormal results are displayed) Labs Reviewed  BASIC METABOLIC PANEL WITH GFR - Abnormal; Notable for the following components:      Result Value   Glucose, Bld 102 (*)    All other components within normal limits  CBC WITH DIFFERENTIAL/PLATELET - Abnormal; Notable for the following components:   Hemoglobin 11.6 (*)    Eosinophils Absolute 0.7 (*)    All other components within normal limits     EKG  ED ECG REPORT I, Waylon Cassis, the attending physician, personally viewed and interpreted this ECG.  Date: 11/24/2023 EKG Time: 0347 Rate: 66 Rhythm: normal sinus rhythm QRS Axis: normal Intervals: normal ST/T Wave abnormalities: normal  Narrative Interpretation: no evidence of acute ischemia   RADIOLOGY    PROCEDURES:  Critical Care performed: No  Procedures   MEDICATIONS ORDERED IN ED: Medications  cloNIDine  (CATAPRES ) tablet 0.2 mg (0.2 mg Oral Given 11/24/23 0426)     IMPRESSION / MDM / ASSESSMENT AND PLAN / ED COURSE  I reviewed the triage vital signs and the nursing notes.  53 year old female with PMH as noted above presents with elevated blood pressure readings as well as a frontal headache.  On exam she is hypertensive.  Other vital signs are normal.  Neurologic exam is nonfocal.  EKG is nonischemic.  Differential diagnosis includes, but is not limited to, uncomplicated hypertension.  I have a low suspicion for hypertensive emergency.  The patient has not been consistently on a blood pressure medication in several months.  She takes losartan  only on days where her blood pressure is elevated.  We will obtain basic labs, EKG, give p.o. clonidine , and  reassess.  Patient's presentation is most consistent with acute complicated illness / injury requiring diagnostic workup.  The patient is on the cardiac monitor to evaluate for evidence of arrhythmia and/or significant heart rate changes.  ----------------------------------------- 5:56 AM on 11/24/2023 -----------------------------------------  BMP and CBC show no acute findings.  The patient's blood pressure is improved.  She is stable for discharge home at this time.  She has a recently filled bottle of her valsartan -hydrochlorothiazide  so I have advised her to restart that.  She will follow-up with her primary care provider.  I gave strict return precautions, and she expressed understanding.    FINAL CLINICAL IMPRESSION(S) / ED DIAGNOSES   Final diagnoses:  Acute nonintractable headache, unspecified headache type  Hypertension, unspecified type     Rx / DC Orders   ED Discharge Orders     None        Note:  This document was prepared using Dragon voice recognition software and may include unintentional dictation errors.    Jacolyn Pae, MD 11/24/23 [phone]

## 2023-11-24 NOTE — Discharge Instructions (Signed)
 Restart taking the valsartan -hydrochlorothiazide  that you already have.  Make an appointment to follow-up with your primary care provider.  Return to the ER for any new, worsening, or persistent severe elevated blood pressure especially over 200 on the top #120 on the bottom number, headache, chest pain, or any other new or worsening symptoms that concern you.

## 2023-11-29 ENCOUNTER — Other Ambulatory Visit (INDEPENDENT_AMBULATORY_CARE_PROVIDER_SITE_OTHER): Payer: Self-pay | Admitting: Primary Care

## 2023-11-29 ENCOUNTER — Ambulatory Visit (INDEPENDENT_AMBULATORY_CARE_PROVIDER_SITE_OTHER): Admitting: Primary Care

## 2023-11-29 VITALS — BP 126/91 | HR 69 | Wt 255.2 lb

## 2023-11-29 DIAGNOSIS — Z7985 Long-term (current) use of injectable non-insulin antidiabetic drugs: Secondary | ICD-10-CM

## 2023-11-29 DIAGNOSIS — E669 Obesity, unspecified: Secondary | ICD-10-CM

## 2023-11-29 DIAGNOSIS — F411 Generalized anxiety disorder: Secondary | ICD-10-CM

## 2023-11-29 DIAGNOSIS — L602 Onychogryphosis: Secondary | ICD-10-CM

## 2023-11-29 DIAGNOSIS — E119 Type 2 diabetes mellitus without complications: Secondary | ICD-10-CM | POA: Diagnosis not present

## 2023-11-29 DIAGNOSIS — E66813 Obesity, class 3: Secondary | ICD-10-CM

## 2023-11-29 DIAGNOSIS — J9801 Acute bronchospasm: Secondary | ICD-10-CM

## 2023-11-29 DIAGNOSIS — F418 Other specified anxiety disorders: Secondary | ICD-10-CM

## 2023-11-29 DIAGNOSIS — I10 Essential (primary) hypertension: Secondary | ICD-10-CM

## 2023-11-29 DIAGNOSIS — Z0130 Encounter for examination of blood pressure without abnormal findings: Secondary | ICD-10-CM

## 2023-11-29 DIAGNOSIS — Z760 Encounter for issue of repeat prescription: Secondary | ICD-10-CM

## 2023-11-29 MED ORDER — QVAR REDIHALER 80 MCG/ACT IN AERB: 1.0000 | INHALATION_SPRAY | Freq: Two times a day (BID) | RESPIRATORY_TRACT | 2 refills | Status: DC

## 2023-11-29 MED ORDER — BUSPIRONE HCL 7.5 MG PO TABS: 7.5000 mg | ORAL_TABLET | Freq: Three times a day (TID) | ORAL | 1 refills | Status: AC

## 2023-11-29 MED ORDER — GLIMEPIRIDE 2 MG PO TABS
2.0000 mg | ORAL_TABLET | Freq: Every day | ORAL | 1 refills | Status: AC
Start: 2023-11-29 — End: ?

## 2023-11-29 MED ORDER — ROSUVASTATIN CALCIUM 10 MG PO TABS: 10.0000 mg | ORAL_TABLET | Freq: Every day | ORAL | 1 refills | Status: AC

## 2023-11-29 MED ORDER — VALSARTAN-HYDROCHLOROTHIAZIDE 160-25 MG PO TABS: 1.0000 | ORAL_TABLET | Freq: Every day | ORAL | 1 refills | Status: AC

## 2023-11-29 MED ORDER — ESCITALOPRAM OXALATE 20 MG PO TABS: 20.0000 mg | ORAL_TABLET | Freq: Every day | ORAL | 1 refills | Status: AC

## 2023-11-29 MED ORDER — ALBUTEROL SULFATE HFA 108 (90 BASE) MCG/ACT IN AERS: 2.0000 | INHALATION_SPRAY | Freq: Four times a day (QID) | RESPIRATORY_TRACT | 2 refills | Status: AC | PRN

## 2023-11-29 NOTE — Progress Notes (Addendum)
 [facility]   Catherine [name] is a 53 y.o. female presents for hypertension evaluation, She does have headaches .  Denies shortness of breath, chest pain or lower extremity edema, sudden onset, vision changes, unilateral weakness, dizziness, paresthesias  Patient is concerned with elevated Bp- ranging from systolic 123-151 diastolic 86- 102.  Patient was instructed by surgeon at [facility] to prior to  bariatric surgery told her to stop taking valsartan  HCTZ and wrote a prescription for losartan   without the diuretics and take as needed.  She has been monitoring her blood pressure noted up above Since blood pressure was elevated not working on losartan  only-patient returned to taking her valsartan  HCTZ blood pressure today is 126/91 slight elevation on diastolic.  Past Medical History:  Diagnosis Date   Allergy    seasonal allergies   Anemia    hx of   Anxiety    on meds   Blood transfusion without reported diagnosis    x 3   Bronchitis    Depression    on meds   Diabetes mellitus without complication (HCC)    on meds   Hyperlipidemia    on meds   Hypertension    on meds   Sleep apnea    no CPAP machine being used at this time (08/19/2020)   Past Surgical History:  Procedure Laterality Date   ABDOMINAL HYSTERECTOMY     BUNIONECTOMY  2012   CESAREAN SECTION     X 2   COLONOSCOPY WITH PROPOFOL  N/A 01/23/2021   Procedure: COLONOSCOPY WITH PROPOFOL ;  Surgeon: Charlanne Groom, MD;  Location: [facility];  Service: Endoscopy;  Laterality: N/A;   LAPAROSCOPIC VAGINAL HYSTERECTOMY  2007   pt still has tubes and ovaries   LAVH  2009   Leiomyoma and menorrhagia   MYOMECTOMY  2005   POLYPECTOMY  01/23/2021   Procedure: POLYPECTOMY;  Surgeon: Charlanne Groom, MD;  Location: [facility];  Service: Endoscopy;;   WISDOM TOOTH EXTRACTION     Allergies  Allergen Reactions   Metformin  And Related Other (See Comments)    Abdominal discomfort.   Latex Rash   Current  Outpatient Medications on File Prior to Visit  Medication Sig Dispense Refill   acetaminophen  (TYLENOL ) 500 MG tablet Take 1,000 mg by mouth every 6 (six) hours as needed for moderate pain or headache.     albuterol  (VENTOLIN  HFA) 108 (90 Base) MCG/ACT inhaler Inhale 2 puffs into the lungs every 6 (six) hours as needed for wheezing or shortness of breath. 8 g 2   beclomethasone (QVAR  REDIHALER) 80 MCG/ACT inhaler Inhale 1 puff into the lungs 2 (two) times daily. 1 each 2   busPIRone  (BUSPAR ) 7.5 MG tablet Take 1 tablet (7.5 mg total) by mouth 3 (three) times daily. 270 tablet 1   ergocalciferol  (VITAMIN D2) 1.25 MG (50000 UT) capsule Take 1 capsule (50,000 Units total) by mouth once a week. 8 capsule 0   escitalopram  (LEXAPRO ) 20 MG tablet Take 1 tablet (20 mg total) by mouth daily. 90 tablet 1   fluticasone  (FLONASE ) 50 MCG/ACT nasal spray PLACE 1 SPRAY INTO BOTH NOSTRILS DAILY AS NEEDED FOR ALLERGIES. 48 mL 1   glimepiride  (AMARYL ) 2 MG tablet Take 1 tablet (2 mg total) by mouth daily before breakfast. 90 tablet 1   rosuvastatin  (CRESTOR ) 10 MG tablet Take 1 tablet (10 mg total) by mouth daily. 90 tablet 1   Semaglutide -Weight Management (WEGOVY ) 2.4 MG/0.75ML SOAJ Inject 2.4 mg into the skin once  a week. 3 mL 6   valsartan -hydrochlorothiazide  (DIOVAN -HCT) 160-25 MG tablet Take 1 tablet by mouth daily. 90 tablet 1   No current facility-administered medications on file prior to visit.   Social History   Socioeconomic History   Marital status: Single    Spouse name: Not on file   Number of children: Not on file   Years of education: Not on file   Highest education level: Some college, no degree  Occupational History   Not on file  Tobacco Use   Smoking status: Former    Current packs/day: 0.50    Types: Cigarettes   Smokeless tobacco: Never   Tobacco comments:    3 months no cigarettes  Vaping Use   Vaping status: Never Used  Substance and Sexual Activity   Alcohol use: Yes     Comment: 1-2 times per month   Drug use: No   Sexual activity: Yes    Birth control/protection: Surgical    Comment: 1st intercourse 53 yo-More than 5 partners-HYST  Other Topics Concern   Not on file  Social History Narrative   Not on file   Social Drivers of Health   Financial Resource Strain: Low Risk  (11/29/2023)   Overall Financial Resource Strain (CARDIA)    Difficulty of Paying Living Expenses: Not very hard  Food Insecurity: No Food Insecurity (11/29/2023)   Hunger Vital Sign    Worried About Running Out of Food in the Last Year: Never true    Ran Out of Food in the Last Year: Never true  Transportation Needs: Unmet Transportation Needs (11/29/2023)   PRAPARE - Administrator, Civil Service (Medical): Yes    Lack of Transportation (Non-Medical): No  Physical Activity: Sufficiently Active (11/29/2023)   Exercise Vital Sign    Days of Exercise per Week: 3 days    Minutes of Exercise per Session: 60 min  Stress: No Stress Concern Present (11/29/2023)   [employer] of Occupational Health - Occupational Stress Questionnaire    Feeling of Stress: Only a little  Social Connections: Unknown (11/29/2023)   Social Connection and Isolation Panel    Frequency of Communication with Friends and Family: More than three times a week    Frequency of Social Gatherings with Friends and Family: More than three times a week    Attends Religious Services: Patient declined    Database administrator or Organizations: No    Attends Engineer, structural: Not on file    Marital Status: Patient declined  Catering manager Violence: Not on file   Family History  Problem Relation Age of Onset   Hypertension Mother    Asthma Mother    Colon polyps Mother    Hypertension Father    Heart attack Father        died at 25 of an MI   Hypertension Sister    Asthma Daughter    Colon cancer Neg Hx    Esophageal cancer Neg Hx    Stomach cancer Neg Hx    Rectal cancer Neg Hx     BRCA 1/2 Neg Hx    Breast cancer Neg Hx    Health Maintenance  Topic Date Due   Hepatitis B Vaccines (1 of 3 - 19+ 3-dose series) Never done   Pneumococcal Vaccine: 19-49 Years (2 of 2 - PCV) 11/10/2018   Pneumococcal Vaccine: 50+ Years (2 of 2 - PCV) 11/10/2018   OPHTHALMOLOGY EXAM  12/29/2019   Zoster Vaccines- Shingrix (  1 of 2) Never done   FOOT EXAM  02/20/2021   COVID-19 Vaccine (3 - 2024-25 season) 12/20/2022   Cervical Cancer Screening (Pap smear)  07/03/2023   INFLUENZA VACCINE  11/19/2023   HEMOGLOBIN A1C  01/21/2024   Diabetic kidney evaluation - Urine ACR  07/21/2024   MAMMOGRAM  08/31/2024   Diabetic kidney evaluation - eGFR measurement  11/23/2024   DTaP/Tdap/Td (2 - Td or Tdap) 11/10/2027   Colonoscopy  01/24/2031   Hepatitis C Screening  Completed   HIV Screening  Completed   HPV VACCINES  Aged Out   Meningococcal B Vaccine  Aged Out     OBJECTIVE:  Vitals:   11/29/23 1014  BP: (!) 126/91  Pulse: 69  SpO2: 96%  Weight: 255 lb 3.2 oz (115.8 kg)    Physical Exam Vitals reviewed.  Constitutional:      Appearance: Normal appearance. She is obese.  HENT:     Head: Normocephalic.     Right Ear: Tympanic membrane, ear canal and external ear normal.     Left Ear: Tympanic membrane, ear canal and external ear normal.     Nose: Nose normal.  Eyes:     Extraocular Movements: Extraocular movements intact.     Pupils: Pupils are equal, round, and reactive to light.  Cardiovascular:     Rate and Rhythm: Normal rate and regular rhythm.  Pulmonary:     Effort: Pulmonary effort is normal.     Breath sounds: Normal breath sounds.  Abdominal:     General: Bowel sounds are normal. There is distension.     Palpations: Abdomen is soft.  Musculoskeletal:        General: Normal range of motion.     Cervical back: Normal range of motion and neck supple.  Skin:    General: Skin is warm and dry.  Neurological:     Mental Status: She is oriented to person, place,  and time.  Psychiatric:        Mood and Affect: Mood normal.        Behavior: Behavior normal.      ROS  Last 3 Office BP readings: BP Readings from Last 3 Encounters:  11/29/23 (!) 126/91  11/24/23 (!) 125/95  09/30/23 116/75    BMET    Component Value Date/Time   NA 141 11/24/2023 0354   NA 139 07/22/2023 0919   K 3.7 11/24/2023 0354   CL 106 11/24/2023 0354   CO2 26 11/24/2023 0354   GLUCOSE 102 (H) 11/24/2023 0354   BUN 13 11/24/2023 0354   BUN 15 07/22/2023 0919   CREATININE 0.78 11/24/2023 0354   CREATININE 0.81 05/17/2014 1634   CALCIUM  9.1 11/24/2023 0354   GFRNONAA >60 11/24/2023 0354   GFRAA 90 06/11/2020 1420    Renal function: Estimated Creatinine Clearance: 106.9 mL/min (by C-G formula based on SCr of 0.78 mg/dL).  Clinical ASCVD: No  The 10-year ASCVD risk score (Arnett DK, et al., 2019) is: 8.8%   Values used to calculate the score:     Age: 58 years     Clincally relevant sex: Female     Is Non-Hispanic African American: Yes     Diabetic: Yes     Tobacco smoker: No     Systolic Blood Pressure: 126 mmHg     Is BP treated: Yes     HDL Cholesterol: 56 mg/dL     Total Cholesterol: 189 mg/dL  ASCVD risk factors include- ITALY   ASSESSMENT &  PLAN: Zoriana was seen today for hypertension.  Diagnoses and all orders for this visit:  Essential hypertension Counseled on lifestyle modifications for blood pressure control including reduced dietary sodium, increased exercise, weight reduction and adequate sleep. Also, educated patient about the risk for cardiovascular events, stroke and heart attack. Also counseled patient about the importance of medication adherence. If you participate in smoking, it is important to stop using tobacco as this will increase the risks associated with uncontrolled blood pressure.    Type 2 diabetes mellitus without complication, without long-term current use of insulin  (HCC) Well controlled A1C 6.0 - educated on lifestyle  modifications, including but not limited to diet choices and adding exercise to daily routine.   .  Class 3 severe obesity with serious comorbidity in adult, unspecified BMI, unspecified obesity type DUODENAL LAPAROSCOPIC  previous 299 lbs now 255      This note has been created with Education officer, environmental. Any transcriptional errors are unintentional.   Rosaline SHAUNNA Bohr, NP 11/29/2023, 10:28 AM

## 2023-12-09 ENCOUNTER — Ambulatory Visit: Admitting: Podiatry

## 2023-12-14 ENCOUNTER — Ambulatory Visit (INDEPENDENT_AMBULATORY_CARE_PROVIDER_SITE_OTHER): Admitting: Podiatry

## 2023-12-14 DIAGNOSIS — B351 Tinea unguium: Secondary | ICD-10-CM | POA: Diagnosis not present

## 2023-12-14 DIAGNOSIS — M79674 Pain in right toe(s): Secondary | ICD-10-CM | POA: Diagnosis not present

## 2023-12-14 DIAGNOSIS — M79675 Pain in left toe(s): Secondary | ICD-10-CM | POA: Diagnosis not present

## 2023-12-14 NOTE — Progress Notes (Unsigned)
  Subjective:  Patient ID: Catherine [name], female    DOB: April 19, 1971,  MRN: [id number]  Chief Complaint  Patient presents with   Nail Problem    Nail trim   53 y.o. female returns for the above complaint.  Patient presents with thickened onychodystrophy mycotic toenails times send mild pain on palpation worse with ambulation is with pressure she would like for me debride arch has not been doing.  Denies any other acute complaints.  Objective:  There were no vitals filed for this visit. Podiatric Exam: Vascular: dorsalis pedis and posterior tibial pulses are palpable bilateral. Capillary return is immediate. Temperature gradient is WNL. Skin turgor WNL  Sensorium: Normal Semmes Weinstein monofilament test. Normal tactile sensation bilaterally. Nail Exam: Pt has thick disfigured discolored nails with subungual debris noted bilateral entire nail hallux through fifth toenails.  Pain on palpation to the nails. Ulcer Exam: There is no evidence of ulcer or pre-ulcerative changes or infection. Orthopedic Exam: Muscle tone and strength are WNL. No limitations in general ROM. No crepitus or effusions noted.  Skin: No Porokeratosis. No infection or ulcers    Assessment & Plan:   1. Pain due to onychomycosis of toenails of both feet     Patient was evaluated and treated and all questions answered.  Onychomycosis with pain  -Nails palliatively debrided as below. -Educated on self-care  Procedure: Nail Debridement Rationale: pain  Type of Debridement: manual, sharp debridement. Instrumentation: Nail nipper, rotary burr. Number of Nails: 10  Procedures and Treatment: Consent by patient was obtained for treatment procedures. The patient understood the discussion of treatment and procedures well. All questions were answered thoroughly reviewed. Debridement of mycotic and hypertrophic toenails, 1 through 5 bilateral and clearing of subungual debris. No ulceration, no infection noted.  Return  Visit-Office Procedure: Patient instructed to return to the office for a follow up visit 3 months for continued evaluation and treatment.  Franky Blanch, DPM    Return in about 3 months (around 03/15/2024) for [place] .

## 2024-01-07 ENCOUNTER — Telehealth (INDEPENDENT_AMBULATORY_CARE_PROVIDER_SITE_OTHER): Payer: Self-pay | Admitting: Primary Care

## 2024-01-07 NOTE — Telephone Encounter (Signed)
 1st attempt contacted pt sch appt 10/14

## 2024-01-11 ENCOUNTER — Ambulatory Visit (INDEPENDENT_AMBULATORY_CARE_PROVIDER_SITE_OTHER): Admitting: Primary Care

## 2024-02-01 ENCOUNTER — Ambulatory Visit (INDEPENDENT_AMBULATORY_CARE_PROVIDER_SITE_OTHER): Admitting: Primary Care

## 2024-02-01 ENCOUNTER — Encounter (INDEPENDENT_AMBULATORY_CARE_PROVIDER_SITE_OTHER): Payer: Self-pay | Admitting: Primary Care

## 2024-02-01 VITALS — BP 111/77 | HR 64 | Resp 16 | Wt 244.0 lb

## 2024-02-01 DIAGNOSIS — I10 Essential (primary) hypertension: Secondary | ICD-10-CM

## 2024-02-01 DIAGNOSIS — Z6838 Body mass index (BMI) 38.0-38.9, adult: Secondary | ICD-10-CM | POA: Diagnosis not present

## 2024-02-01 DIAGNOSIS — Z7984 Long term (current) use of oral hypoglycemic drugs: Secondary | ICD-10-CM | POA: Diagnosis not present

## 2024-02-01 DIAGNOSIS — E119 Type 2 diabetes mellitus without complications: Secondary | ICD-10-CM | POA: Diagnosis not present

## 2024-02-01 DIAGNOSIS — E66813 Obesity, class 3: Secondary | ICD-10-CM

## 2024-02-01 DIAGNOSIS — J9801 Acute bronchospasm: Secondary | ICD-10-CM

## 2024-02-01 MED ORDER — PULMICORT FLEXHALER 180 MCG/ACT IN AEPB: 2.0000 | INHALATION_SPRAY | Freq: Two times a day (BID) | RESPIRATORY_TRACT | 3 refills | Status: AC

## 2024-02-01 NOTE — Progress Notes (Signed)
 [facility]   RONEISHA STERN is a 53 y.o. female presents for hypertension evaluation, Denies shortness of breath, headaches, chest pain or lower extremity edema, sudden onset, vision changes, unilateral weakness, dizziness, paresthesias   Patient reports adherence with medications.  Dietary habits include: Monitoring sodium intake, carbohydrates and fried food, decrease eating out reading labels Exercise habits include:  3 days Family / Social history: Father MI at age 26   Past Medical History:  Diagnosis Date   Allergy    seasonal allergies   Anemia    hx of   Anxiety    on meds   Blood transfusion without reported diagnosis    x 3   Bronchitis    Depression    on meds   Diabetes mellitus without complication (HCC)    on meds   Hyperlipidemia    on meds   Hypertension    on meds   Sleep apnea    no CPAP machine being used at this time (08/19/2020)   Past Surgical History:  Procedure Laterality Date   ABDOMINAL HYSTERECTOMY     BUNIONECTOMY  2012   CESAREAN SECTION     X 2   COLONOSCOPY WITH PROPOFOL  N/A 01/23/2021   Procedure: COLONOSCOPY WITH PROPOFOL ;  Surgeon: Charlanne Groom, MD;  Location: [facility];  Service: Endoscopy;  Laterality: N/A;   LAPAROSCOPIC VAGINAL HYSTERECTOMY  2007   pt still has tubes and ovaries   LAVH  2009   Leiomyoma and menorrhagia   MYOMECTOMY  2005   POLYPECTOMY  01/23/2021   Procedure: POLYPECTOMY;  Surgeon: Charlanne Groom, MD;  Location: [facility];  Service: Endoscopy;;   WISDOM TOOTH EXTRACTION     Allergies  Allergen Reactions   Metformin  And Related Other (See Comments)    Abdominal discomfort.   Latex Rash   Current Outpatient Medications on File Prior to Visit  Medication Sig Dispense Refill   acetaminophen  (TYLENOL ) 500 MG tablet Take 1,000 mg by mouth every 6 (six) hours as needed for moderate pain or headache.     albuterol  (VENTOLIN  HFA) 108 (90 Base) MCG/ACT inhaler Inhale 2 puffs into the lungs  every 6 (six) hours as needed for wheezing or shortness of breath. 8 g 2   budesonide  (PULMICORT  FLEXHALER) 180 MCG/ACT inhaler Inhale 2 puffs into the lungs 2 (two) times daily. (Patient not taking: Reported on 02/01/2024) 1 each 3   busPIRone  (BUSPAR ) 7.5 MG tablet Take 1 tablet (7.5 mg total) by mouth 3 (three) times daily. 270 tablet 1   ergocalciferol  (VITAMIN D2) 1.25 MG (50000 UT) capsule Take 1 capsule (50,000 Units total) by mouth once a week. (Patient not taking: Reported on 02/01/2024) 8 capsule 0   escitalopram  (LEXAPRO ) 20 MG tablet Take 1 tablet (20 mg total) by mouth daily. 90 tablet 1   fluticasone  (FLONASE ) 50 MCG/ACT nasal spray PLACE 1 SPRAY INTO BOTH NOSTRILS DAILY AS NEEDED FOR ALLERGIES. 48 mL 1   glimepiride  (AMARYL ) 2 MG tablet Take 1 tablet (2 mg total) by mouth daily before breakfast. 90 tablet 1   rosuvastatin  (CRESTOR ) 10 MG tablet Take 1 tablet (10 mg total) by mouth daily. 90 tablet 1   Semaglutide -Weight Management (WEGOVY ) 2.4 MG/0.75ML SOAJ Inject 2.4 mg into the skin once a week. (Patient not taking: Reported on 02/01/2024) 3 mL 6   valsartan -hydrochlorothiazide  (DIOVAN -HCT) 160-25 MG tablet Take 1 tablet by mouth daily. 90 tablet 1   No current facility-administered medications on file prior to visit.  Social History   Socioeconomic History   Marital status: Single    Spouse name: Not on file   Number of children: Not on file   Years of education: Not on file   Highest education level: Some college, no degree  Occupational History   Not on file  Tobacco Use   Smoking status: Former    Current packs/day: 0.50    Types: Cigarettes   Smokeless tobacco: Never   Tobacco comments:    3 months no cigarettes  Vaping Use   Vaping status: Never Used  Substance and Sexual Activity   Alcohol use: Yes    Comment: 1-2 times per month   Drug use: No   Sexual activity: Yes    Birth control/protection: Surgical    Comment: 1st intercourse 53 yo-More than 5  partners-HYST  Other Topics Concern   Not on file  Social History Narrative   Not on file   Social Drivers of Health   Financial Resource Strain: Low Risk  (11/29/2023)   Overall Financial Resource Strain (CARDIA)    Difficulty of Paying Living Expenses: Not very hard  Food Insecurity: No Food Insecurity (11/29/2023)   Hunger Vital Sign    Worried About Running Out of Food in the Last Year: Never true    Ran Out of Food in the Last Year: Never true  Transportation Needs: Unmet Transportation Needs (11/29/2023)   PRAPARE - Administrator, Civil Service (Medical): Yes    Lack of Transportation (Non-Medical): No  Physical Activity: Sufficiently Active (11/29/2023)   Exercise Vital Sign    Days of Exercise per Week: 3 days    Minutes of Exercise per Session: 60 min  Stress: No Stress Concern Present (11/29/2023)   [employer] of Occupational Health - Occupational Stress Questionnaire    Feeling of Stress: Only a little  Social Connections: Unknown (11/29/2023)   Social Connection and Isolation Panel    Frequency of Communication with Friends and Family: More than three times a week    Frequency of Social Gatherings with Friends and Family: More than three times a week    Attends Religious Services: Patient declined    Database administrator or Organizations: No    Attends Engineer, structural: Not on file    Marital Status: Patient declined  Catering manager Violence: Not on file   Family History  Problem Relation Age of Onset   Hypertension Mother    Asthma Mother    Colon polyps Mother    Hypertension Father    Heart attack Father        died at 76 of an MI   Hypertension Sister    Asthma Daughter    Colon cancer Neg Hx    Esophageal cancer Neg Hx    Stomach cancer Neg Hx    Rectal cancer Neg Hx    BRCA 1/2 Neg Hx    Breast cancer Neg Hx    Health Maintenance  Topic Date Due   OPHTHALMOLOGY EXAM  12/29/2019   FOOT EXAM  02/20/2021   Cervical  Cancer Screening (Pap smear)  07/03/2023   HEMOGLOBIN A1C  01/21/2024   COVID-19 Vaccine (3 - 2025-26 season) 02/17/2024 (Originally 12/20/2023)   Zoster Vaccines- Shingrix (1 of 2) 05/03/2024 (Originally 06/11/2020)   Influenza Vaccine  07/18/2024 (Originally 11/19/2023)   Pneumococcal Vaccine: 50+ Years (2 of 2 - PCV) 01/31/2025 (Originally 11/10/2018)   Hepatitis B Vaccines 19-59 Average Risk (1 of 3 -  19+ 3-dose series) 01/31/2025 (Originally 06/11/1989)   Diabetic kidney evaluation - Urine ACR  07/21/2024   Mammogram  08/31/2024   Diabetic kidney evaluation - eGFR measurement  11/23/2024   DTaP/Tdap/Td (2 - Td or Tdap) 11/10/2027   Colonoscopy  01/24/2031   Hepatitis C Screening  Completed   HIV Screening  Completed   HPV VACCINES  Aged Out   Meningococcal B Vaccine  Aged Out     OBJECTIVE:  Vitals:   02/01/24 0850  BP: 111/77  Pulse: 64  Resp: 16  SpO2: 100%  Weight: 244 lb (110.7 kg)    Physical Exam Vitals reviewed.  Constitutional:      Appearance: Normal appearance. She is obese.  HENT:     Head: Normocephalic.     Right Ear: Tympanic membrane, ear canal and external ear normal.     Left Ear: Tympanic membrane, ear canal and external ear normal.     Nose: Nose normal.     Mouth/Throat:     Mouth: Mucous membranes are moist.  Eyes:     Extraocular Movements: Extraocular movements intact.     Pupils: Pupils are equal, round, and reactive to light.  Cardiovascular:     Rate and Rhythm: Normal rate.  Pulmonary:     Effort: Pulmonary effort is normal.     Breath sounds: Normal breath sounds.  Abdominal:     General: Bowel sounds are normal. There is distension.     Palpations: Abdomen is soft.  Musculoskeletal:        General: Normal range of motion.     Cervical back: Normal range of motion.  Skin:    General: Skin is warm and dry.  Neurological:     Mental Status: She is alert and oriented to person, place, and time.  Psychiatric:        Mood and Affect:  Mood normal.        Behavior: Behavior normal.        Thought Content: Thought content normal.      Review of Systems  All other systems reviewed and are negative.   Last 3 Office BP readings: BP Readings from Last 3 Encounters:  02/01/24 111/77  11/29/23 (!) 126/91  11/24/23 (!) 125/95    BMET    Component Value Date/Time   NA 141 11/24/2023 0354   NA 139 07/22/2023 0919   K 3.7 11/24/2023 0354   CL 106 11/24/2023 0354   CO2 26 11/24/2023 0354   GLUCOSE 102 (H) 11/24/2023 0354   BUN 13 11/24/2023 0354   BUN 15 07/22/2023 0919   CREATININE 0.78 11/24/2023 0354   CREATININE 0.81 05/17/2014 1634   CALCIUM  9.1 11/24/2023 0354   GFRNONAA >60 11/24/2023 0354   GFRAA 90 06/11/2020 1420    Renal function: CrCl cannot be calculated (Patient's most recent lab result is older than the maximum 21 days allowed.).  Clinical ASCVD: No  The 10-year ASCVD risk score (Arnett DK, et al., 2019) is: 5.6%   Values used to calculate the score:     Age: 27 years     Clincally relevant sex: Female     Is Non-Hispanic African American: Yes     Diabetic: Yes     Tobacco smoker: No     Systolic Blood Pressure: 111 mmHg     Is BP treated: Yes     HDL Cholesterol: 56 mg/dL     Total Cholesterol: 189 mg/dL  ASCVD risk factors include- ITALY  ASSESSMENT & PLAN: Nida was seen today for blood pressure check.  Diagnoses and all orders for this visit:  Type 2 diabetes mellitus without complication, without long-term current use of insulin  (HCC) -   Well-controlled Previous 6.0 point August 2025 glimepiride  2 mg before breakfast Currently stop taking Wegovy  currently taking  Bronchospasm Pulmicort  bid    Essential hypertension No change in blood pressure continue valsartan  hydrochlorothiazide   160/25 daily tolerating well good Blood pressure  well control including reduced dietary sodium, increased exercise, weight reduction and adequate sleep. Also, educated patient about the risk  for cardiovascular events, stroke and heart attack. Also counseled patient about the importance of medication adherence. If you participate in smoking, it is important to stop using tobacco as this will increase the risks associated with uncontrolled blood pressure.   Goal BP:  For patients younger than 60: Goal BP < 130/80. For patients 60 and older: Goal BP < 140/90. For patients with diabetes: Goal BP < 130/80. Your most recent BP: 111/77  Minimize salt intake. Minimize alcohol intake  Class 3 severe obesity with serious comorbidity in adult, unspecified BMI, unspecified obesity type (HCC) Recently underwent surgical surgery for weight lossS is SADIS she has currently lost 77 pounds in 5 months monitor outside of facility for weight loss management     This note has been created with Education officer, environmental. Any transcriptional errors are unintentional.   Rosaline SHAUNNA Bohr, NP 02/01/2024, 8:55 AM

## 2024-03-10 ENCOUNTER — Encounter: Payer: Self-pay | Admitting: Gastroenterology

## 2024-03-23 ENCOUNTER — Ambulatory Visit: Admitting: Podiatry

## 2024-03-23 ENCOUNTER — Encounter: Payer: Self-pay | Admitting: Podiatry

## 2024-03-23 DIAGNOSIS — B351 Tinea unguium: Secondary | ICD-10-CM

## 2024-03-23 DIAGNOSIS — M79675 Pain in left toe(s): Secondary | ICD-10-CM

## 2024-03-23 DIAGNOSIS — M79674 Pain in right toe(s): Secondary | ICD-10-CM

## 2024-03-23 DIAGNOSIS — E119 Type 2 diabetes mellitus without complications: Secondary | ICD-10-CM

## 2024-03-23 DIAGNOSIS — L84 Corns and callosities: Secondary | ICD-10-CM

## 2024-03-23 NOTE — Progress Notes (Signed)
 Subjective:  Patient ID: Catherine [name], female    DOB: Nov 21, 1970,  MRN: [id number]  JYL CHICO presents to clinic today for preventative diabetic foot care and callus(es) of both feet and painful mycotic toenails that are difficult to trim. Painful toenails interfere with ambulation. Aggravating factors include wearing enclosed shoe gear. Pain is relieved with periodic professional debridement. Painful calluses are aggravated when weightbearing with and without shoegear. Pain is relieved with periodic professional debridement.  Patient states she has had a long time problem with cracked callus of right great toe. She is a former smoker celebrating 3 years tobacco-free. 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 She wears house slippers most of the time. Chief Complaint  Patient presents with   Diabetes    RFC. A1c 6.4. Callous bilateral. Dr. Celestia is her PCP. Last visit was in Oct   New problem(s): None.   PCP is Catherine Rosaline SQUIBB, NP.  Allergies  Allergen Reactions   Metformin  And Related Other (See Comments)    Abdominal discomfort.   Latex Rash    Review of Systems: Negative except as noted in the HPI.  Objective: No changes noted in today's physical examination. There were no vitals filed for this visit. Catherine [name] is a pleasant 53 y.o. female obese in NAD. AAO x 3.  Vascular Examination: Capillary refill time <3 seconds b/l. Palpable pedal pulses. Pedal hair sparse b/l. Pedal edema absen. No pain with calf compression b/l. Skin temperature gradient WNL b/l. No cyanosis or clubbing b/l. No ischemia or gangrene noted b/l.   Neurological Examination: Sensation grossly intact b/l with 10 gram monofilament. Vibratory sensation intact b/l.   Dermatological Examination: Pedal skin with normal turgor, texture and tone b/l.  No open wounds. No interdigital macerations.   Toenails 1-5 b/l thick, discolored, elongated with subungual debris and pain on dorsal palpation.    Hyperkeratotic lesion(s) plantar IPJ of right great toe and submet head 5 b/l.  No erythema, no edema, no drainage, no fluctuance.  Musculoskeletal Examination: Muscle strength 5/5 to all lower extremity muscle groups bilaterally. Pes planus deformity noted bilateral LE.SABRA No pain, crepitus or joint limitation noted with ROM b/l LE.  Patient ambulates independently without assistive aids.  Radiographs: None  Last A1c:      Latest Ref Rng & Units 07/22/2023    9:19 AM  Hemoglobin A1C  Hemoglobin-A1c 4.8 - 5.6 % 6.4    Assessment/Plan: 1. Pain due to onychomycosis of toenails of both feet   2. Callus   3. Controlled type 2 diabetes mellitus without complication, without long-term current use of insulin  [facility])   -Patient was evaluated today. All questions/concerns addressed on today's visit. -Discussed benefits of diabetic shoes/insoles with patient. We will revisit on her next visit. -Continue diabetic foot care principles: inspect feet daily, monitor glucose as recommended by PCP and/or Endocrinologist, and follow prescribed diet per PCP, Endocrinologist and/or dietician. -Patient to continue soft, supportive shoe gear daily. -Mycotic toenails 1-5 bilaterally were debrided in length and girth with sterile nail nippers and dremel without incident. -Callus(es) plantar IPJ of right great toe and submet head 5 b/l pared utilizing sterile scalpel blade without complication or incident. Total number debrided =3. -Recommended Skechers shoes with stretchable uppers and memory foam insoles. They can be purchased at [employer] or [email or web address].  -Recommended heel balm which can be purchased OTC at [facility] for hyperkeratotic heels. She related understanding. -Recommended daily use of Revitaderm Cream to callus(es)/porokeratotic lesion(s) and weekly to biweekly use  of pumice stone to lesions. Avoid use of sharp instruments such as razor blades or metal  -Patient/POA to call should there be  question/concern in the interim.   Return in about 9 weeks (around 05/25/2024).  Delon LITTIE Merlin, DPM      [place] LOCATION: [address], [place] [address]                   Office [phone]   [facility] LOCATION: [address] [place], [place] [address] Office [phone]

## 2024-04-27 NOTE — Progress Notes (Signed)
 "    04/28/2024 Catherine [name] [id number] 10-30-1970  Referring provider: Celestia Rosaline SQUIBB, NP Primary GI doctor: Dr. 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 - Prescribed compounded diltiazem-lidocaine  cream for rectal application. - Ordered laboratory tests for inflammatory markers. 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 female with a past medical history listed below presents for evaluation of rectal pain.   Discussed the use of AI scribe software for clinical note transcription with the patient, who gave verbal consent to proceed.  History of Present Illness   Catherine [name] is a 54 year old female, status post biliopancreatic diversion with duodenal switch, who presents with rectal discomfort and increased bowel movement frequency.  Bowel movement frequency increased from 1-2 times daily preoperatively to 5-6 times daily since bariatric surgery in May 2020. Stools are soft, not watery or clay-like, and not typically oily or shiny except after greasy foods. Bowel  movements occur throughout the day, beginning early morning and continuing after coffee, lunch, and before bedtime, without direct association with eating. Probiotics have provided some improvement. Malodorous stools are noted, and the frequency impacts daily life.  For the past 3-4 months, rectal discomfort described as a bulging sensation at the anal exit occurs, with something sticking out and causing pain at the onset of defecation. Discomfort subsides after bowel movement, but a protrusion remains, noticed when wiping. Sitting is sometimes uncomfortable, especially shortly after a bowel movement, requiring standing for a few minutes before sitting comfortably. No rectal bleeding, dark stools, or blood on tissue as reported by the patient. Sensation of something stuck and pulling between the buttocks persists, affecting quality of life and travel plans.  Colonoscopy in October 2022 revealed a small hyperplastic polyp (removed), diverticulosis, and internal hemorrhoids. Repeat colonoscopy is due in 2027. No family history of colon cancer.  Significant weight loss from 331 lbs preoperatively to 234 lbs currently, with improved activity tolerance and overall well-being. She is satisfied with the outcome of bariatric surgery aside from current bowel and rectal symptoms.        She  reports that she has quit smoking. Her smoking use included cigarettes. She has never used smokeless tobacco. She reports current alcohol use. She reports that she does not use drugs.  RELEVANT GI HISTORY, IMAGING AND LABS: Results   Diagnostic Colonoscopy (01/2021): Small hyperplastic polyp excised, diverticulosis, internal hemorrhoids      CBC    Component Value Date/Time   WBC 7.5 11/24/2023 0354   RBC 4.03 11/24/2023 0354   HGB 11.6 (L) 11/24/2023 0354   HGB 13.9 07/22/2023 0919   HCT 37.7 11/24/2023 0354  HCT 43.5 07/22/2023 0919   PLT 196 11/24/2023 0354   PLT 283 07/22/2023 0919   MCV 93.5  11/24/2023 0354   MCV 94 07/22/2023 0919   MCH 28.8 11/24/2023 0354   MCHC 30.8 11/24/2023 0354   RDW 14.3 11/24/2023 0354   RDW 13.6 07/22/2023 0919   LYMPHSABS 3.7 11/24/2023 0354   LYMPHSABS 3.4 (H) 07/22/2023 0919   MONOABS 0.6 11/24/2023 0354   EOSABS 0.7 (H) 11/24/2023 0354   EOSABS 0.5 (H) 07/22/2023 0919   BASOSABS 0.1 11/24/2023 0354   BASOSABS 0.1 07/22/2023 0919   Recent Labs    07/22/23 0919 09/23/23 1211 09/30/23 0805 11/24/23 0354  HGB 13.9 13.2 13.1 11.6*    CMP     Component Value Date/Time   NA 141 11/24/2023 0354   NA 139 07/22/2023 0919   K 3.7 11/24/2023 0354   CL 106 11/24/2023 0354   CO2 26 11/24/2023 0354   GLUCOSE 102 (H) 11/24/2023 0354   BUN 13 11/24/2023 0354   BUN 15 07/22/2023 0919   CREATININE 0.78 11/24/2023 0354   CREATININE 0.81 05/17/2014 1634   CALCIUM  9.1 11/24/2023 0354   PROT 7.9 09/30/2023 0805   PROT 7.4 07/22/2023 0919   ALBUMIN 4.3 09/30/2023 0805   ALBUMIN 4.6 07/22/2023 0919   AST 29 09/30/2023 0805   ALT 36 09/30/2023 0805   ALKPHOS 68 09/30/2023 0805   BILITOT 1.3 (H) 09/30/2023 0805   BILITOT 0.5 07/22/2023 0919   GFRNONAA >60 11/24/2023 0354   GFRAA 90 06/11/2020 1420      Latest Ref Rng & Units 09/30/2023    8:05 AM 09/23/2023   12:11 PM 07/22/2023    9:19 AM  Hepatic Function  Total Protein 6.5 - 8.1 g/dL 7.9  7.5  7.4   Albumin 3.5 - 5.0 g/dL 4.3  4.2  4.6   AST 15 - 41 U/L 29  37  17   ALT 0 - 44 U/L 36  70  15   Alk Phosphatase 38 - 126 U/L 68  86  77   Total Bilirubin 0.0 - 1.2 mg/dL 1.3  1.4  0.5       Current Medications:   Current Outpatient Medications (Endocrine & Metabolic):    estradiol (VIVELLE-DOT) 0.075 MG/24HR, Place 1 patch onto the skin 2 (two) times a week.   glimepiride  (AMARYL ) 2 MG tablet, Take 1 tablet (2 mg total) by mouth daily before breakfast.  Current Outpatient Medications (Cardiovascular):    rosuvastatin  (CRESTOR ) 10 MG tablet, Take 1 tablet (10 mg total) by mouth daily.    valsartan -hydrochlorothiazide  (DIOVAN -HCT) 160-25 MG tablet, Take 1 tablet by mouth daily.  Current Outpatient Medications (Respiratory):    albuterol  (VENTOLIN  HFA) 108 (90 Base) MCG/ACT inhaler, Inhale 2 puffs into the lungs every 6 (six) hours as needed for wheezing or shortness of breath.   budesonide  (PULMICORT  FLEXHALER) 180 MCG/ACT inhaler, Inhale 2 puffs into the lungs 2 (two) times daily.   fluticasone  (FLONASE ) 50 MCG/ACT nasal spray, PLACE 1 SPRAY INTO BOTH NOSTRILS DAILY AS NEEDED FOR ALLERGIES. (Patient not taking: Reported on 04/28/2024)  Current Outpatient Medications (Other):    AMBULATORY NON FORMULARY MEDICATION, Medication Name: Diltiazem 2%/Lidocaine  2% Using your index finger apply a small amount of medication inside the anal opening and to the external anal area twice daily x 6 weeks.   busPIRone  (BUSPAR ) 7.5 MG tablet, Take 1 tablet (7.5 mg total) by mouth 3 (three) times daily.   escitalopram  (LEXAPRO ) 20 MG  tablet, Take 1 tablet (20 mg total) by mouth daily.   estradiol (ESTRACE) 0.01 % CREA vaginal cream, Place vaginally.   lipase/protease/amylase (CREON ) 36000 UNITS CPEP capsule, Take 1 capsule with each meal and every snack.   omeprazole (PRILOSEC) 20 MG capsule, Take 20 mg by mouth every morning.   Semaglutide -Weight Management (WEGOVY ) 2.4 MG/0.75ML SOAJ, Inject 2.4 mg into the skin once a week. (Patient not taking: Reported on 04/28/2024)  Medical History:  Past Medical History:  Diagnosis Date   Allergy    seasonal allergies   Anemia    hx of   Anxiety    on meds   Blood transfusion without reported diagnosis    x 3   Bronchitis    Depression    on meds   Diabetes mellitus without complication (HCC)    on meds   Hyperlipidemia    on meds   Hypertension    on meds   Sleep apnea    no CPAP machine being used at this time (08/19/2020)   Allergies: Allergies[1]   Surgical History:  She  has a past surgical history that includes Myomectomy (2005);  Bunionectomy (2012); Cesarean section; LAVH (2009); Wisdom tooth extraction; Laparoscopic vaginal hysterectomy (2007); Colonoscopy with propofol  (N/A, 01/23/2021); polypectomy (01/23/2021); Abdominal hysterectomy; and Bariatric Surgery. Family History:  Her family history includes Asthma in her daughter and mother; Colon polyps in her mother; Heart attack in her father; Hypertension in her father, mother, and sister.  REVIEW OF SYSTEMS  : All other systems reviewed and negative except where noted in the History of Present Illness.  PHYSICAL EXAM: BP 120/78   Pulse 77   Ht 5' 7 (1.702 m)   Wt 235 lb 8 oz (106.8 kg)   BMI 36.88 kg/m  Physical Exam   MEASUREMENTS: Weight- 234. GENERAL APPEARANCE: Well nourished, in no apparent distress. HEENT: No cervical lymphadenopathy, unremarkable thyroid , sclerae anicteric, conjunctiva pink. RESPIRATORY: Respiratory effort normal, breath sounds equal bilateral without rales, rhonchi, wheezing. CARDIO: Regular rate and rhythm with no murmurs, rubs, or gallops, peripheral pulses intact. ABDOMEN: Soft, non-distended, active bowel sounds in all four quadrants, no tenderness to palpation, no rebound, no mass appreciated. RECTAL: Hemorrhoidal skin tag present, not inflamed. Bright red blood present, very tender. MUSCULOSKELETAL: Full range of motion, normal gait, without edema. SKIN: Dry, intact without rashes or lesions. No jaundice. NEURO: Alert, oriented, no focal deficits. PSYCH: Cooperative, normal mood and affect.      Alan JONELLE Coombs, PA-C 3:37 PM      [1]  Allergies Allergen Reactions   Metformin  And Related Other (See Comments)    Abdominal discomfort.   Latex Rash   "

## 2024-04-28 ENCOUNTER — Encounter: Payer: Self-pay | Admitting: Physician Assistant

## 2024-04-28 ENCOUNTER — Other Ambulatory Visit

## 2024-04-28 ENCOUNTER — Ambulatory Visit: Admitting: Physician Assistant

## 2024-04-28 ENCOUNTER — Ambulatory Visit: Payer: Self-pay | Admitting: Physician Assistant

## 2024-04-28 VITALS — BP 120/78 | HR 77 | Ht 67.0 in | Wt 235.5 lb

## 2024-04-28 DIAGNOSIS — K6289 Other specified diseases of anus and rectum: Secondary | ICD-10-CM

## 2024-04-28 DIAGNOSIS — Z860100 Personal history of colon polyps, unspecified: Secondary | ICD-10-CM

## 2024-04-28 DIAGNOSIS — K625 Hemorrhage of anus and rectum: Secondary | ICD-10-CM

## 2024-04-28 DIAGNOSIS — R195 Other fecal abnormalities: Secondary | ICD-10-CM

## 2024-04-28 DIAGNOSIS — Z8719 Personal history of other diseases of the digestive system: Secondary | ICD-10-CM | POA: Diagnosis not present

## 2024-04-28 DIAGNOSIS — Z9884 Bariatric surgery status: Secondary | ICD-10-CM

## 2024-04-28 LAB — CBC WITH DIFFERENTIAL/PLATELET
Basophils Absolute: 0.1 K/uL (ref 0.0–0.1)
Basophils Relative: 1.3 % (ref 0.0–3.0)
Eosinophils Absolute: 0.5 K/uL (ref 0.0–0.7)
Eosinophils Relative: 6.7 % — ABNORMAL HIGH (ref 0.0–5.0)
HCT: 38.5 % (ref 36.0–46.0)
Hemoglobin: 12.5 g/dL (ref 12.0–15.0)
Lymphocytes Relative: 48.4 % — ABNORMAL HIGH (ref 12.0–46.0)
Lymphs Abs: 3.4 K/uL (ref 0.7–4.0)
MCHC: 32.5 g/dL (ref 30.0–36.0)
MCV: 90.1 fl (ref 78.0–100.0)
Monocytes Absolute: 0.6 K/uL (ref 0.1–1.0)
Monocytes Relative: 8 % (ref 3.0–12.0)
Neutro Abs: 2.5 K/uL (ref 1.4–7.7)
Neutrophils Relative %: 35.6 % — ABNORMAL LOW (ref 43.0–77.0)
Platelets: 221 K/uL (ref 150.0–400.0)
RBC: 4.27 Mil/uL (ref 3.87–5.11)
RDW: 14.7 % (ref 11.5–15.5)
WBC: 7.1 K/uL (ref 4.0–10.5)

## 2024-04-28 LAB — SEDIMENTATION RATE: Sed Rate: 39 mm/h — ABNORMAL HIGH (ref 0–30)

## 2024-04-28 LAB — C-REACTIVE PROTEIN: CRP: 0.9 mg/dL — ABNORMAL LOW (ref 1.0–20.0)

## 2024-04-28 MED ORDER — PANCRELIPASE (LIP-PROT-AMYL) 36000-114000 UNITS PO CPEP: ORAL_CAPSULE | ORAL | 0 refills | Status: AC

## 2024-04-28 MED ORDER — AMBULATORY NON FORMULARY MEDICATION: 1 refills | Status: AC

## 2024-04-28 NOTE — Patient Instructions (Addendum)
 Your provider has requested that you go to the basement level for lab work before leaving today. Press B on the elevator. The lab is located at the first door on the left as you exit the elevator.  Due to recent changes in healthcare laws, you may see the results of your imaging and laboratory studies on MyChart before your provider has had a chance to review them.  We understand that in some cases there may be results that are confusing or concerning to you. Not all laboratory results come back in the same time frame and the provider may be waiting for multiple results in order to interpret others.  Please give us  48 hours in order for your provider to thoroughly review all the results before contacting the office for clarification of your results.   Anal Fissure, Adult  A fissure is a linear defect in the anal mucosa, symptoms include burning, itching, discomfort especially with a bowel movement with associated rectal bleeding.  Risk factors include low fiber diet, chronic constipation and straining. Anal fissures can take a very long time to heal so this will be a 2 to 73-month process.  Treatment for a fissure includes:  -decreasing time in the toilet should not be more than 5 minutes -adding fiber supplement such as Benefiber or Citrucel -increasing water. -There is a lubricating suppository over-the-counter called Calmol-4 you can get from [facility] or from your pharmacy (may have to order) that I want to do twice daily morning and evening for 8 weeks.  This is a 60 to 80% success rate. -I am also going to send in a calcium  channel blocker cream to a compound pharmacy, apply twice daily for 12 weeks.  We will see you back in about a month If after 3 months this is not helpful then we will we will refer you to general surgery for evaluation, they can do Botox injections under anesthesia or surgery.   We have sent the following medications to your pharmacy for you to pick up at your  convenience: Diltiazem/lidocaine  cream,  3 x daily for 2 months.   This medication was sent to a compound pharmacy:  [facility] [address] [place], [place], [place] [address]  [phone]  Please DO NOT go directly from our office to pick up this medication! Give the pharmacy 1 day to process the prescription. Extra time is required for them to compound your medication.   - Can try loperamide 4 mg (Imodium) initially, then 2 mg after each unformed stool for =2 days, with a maximum of 16 mg/day.  - If loperamide is not working, you could try bismuth salicylate (Pepto-Bismol) 30 mL or two tablets every 30 minutes for eight doses. Pepto-Bismol may make your stools black.   Exocrine Pancreatic Insufficiency (EPI) EPI is a condition in which your body doesn't provide enough pancreatic enzymes to properly digest your food (which can sometimes lead to some unpleasant digestive symptoms such as bloating, AB pain and loose stools, weight loss).   For many people, EPI is also a chronic lifelong condition.   That's why its so important to know what to expect with your EPI treatment, because with the right plan in place, EPI is manageable.  HOW DO I TAKE PANCREATIC ENZYMES?  Pancreatic Enzyme Replacement therapy (Creon ) is only available through prescription and cannot be substituted with over the counter alternatives.   Your doctor will personalize your dose based on your weight, diet, and symptoms.  The number of  capsules you take per meal will depend on your prescribed dose.   We have given you samples of the following medication to take: Creon .  Creon  must be taken DURING every meal and snack.   Whether its a full meal or a snack, take Creon  every time you eat. Take 1 with snacks and 2 with meals.     FODMAP stands for fermentable oligo-, di-, mono-saccharides and polyols (1). These are the scientific terms used to classify groups of carbs that are difficult for our body to  digest and that are notorious for triggering digestive symptoms like bloating, gas, loose stools and stomach pain.   You can try low FODMAP diet  - start with eliminating just one column at a time that you feel may be a trigger for you. - the table at the very bottom contains foods that are low in FODMAPs   Sometimes trying to eliminate the FODMAP's from your diet is difficult or tricky, if you are stuggling with trying to do the elimination diet you can try an enzyme.  There is a food enzymes that you sprinkle in or on your food that helps break down the FODMAP. You can read more about the enzyme by going to this site: https://fodzyme.com/    Thank you for trusting me with your gastrointestinal care!   Alan Coombs, PA-C  _______________________________________________________  If your blood pressure at your visit was 140/90 or greater, please contact your primary care physician to follow up on this.  _______________________________________________________  If you are age 66 or older, your body mass index should be between 23-30. Your Body mass index is 36.88 kg/m. If this is out of the aforementioned range listed, please consider follow up with your Primary Care Provider.  If you are age 1 or younger, your body mass index should be between 19-25. Your Body mass index is 36.88 kg/m. If this is out of the aformentioned range listed, please consider follow up with your Primary Care Provider.   ________________________________________________________  The [facility] providers would like to encourage you to use MYCHART to communicate with providers for non-urgent requests or questions.  Due to long hold times on the telephone, sending your provider a message by [facility] may be a faster and more efficient way to get a response.  Please allow 48 business hours for a response.  Please remember that this is for non-urgent requests.   _______________________________________________________  [facility] is using a team-based approach to care.  Your team is made up of your doctor and two to three APPS. Our APPS (Nurse Practitioners and Physician Assistants) work with your physician to ensure care continuity for you. They are fully qualified to address your health concerns and develop a treatment plan. They communicate directly with your gastroenterologist to care for you. Seeing the Advanced Practice Practitioners on your physician's team can help you by facilitating care more promptly, often allowing for earlier appointments, access to diagnostic testing, procedures, and other specialty referrals.

## 2024-05-08 ENCOUNTER — Other Ambulatory Visit

## 2024-05-08 DIAGNOSIS — K6289 Other specified diseases of anus and rectum: Secondary | ICD-10-CM

## 2024-05-10 ENCOUNTER — Ambulatory Visit: Admitting: Gastroenterology

## 2024-05-11 ENCOUNTER — Ambulatory Visit: Payer: Self-pay | Admitting: Physician Assistant

## 2024-05-11 LAB — CALPROTECTIN, FECAL: Calprotectin, Fecal: 147 ug/g — ABNORMAL HIGH (ref 0–120)

## 2024-05-22 ENCOUNTER — Ambulatory Visit: Admitting: Family Medicine

## 2024-05-31 ENCOUNTER — Ambulatory Visit: Admitting: Physician Assistant

## 2024-06-19 ENCOUNTER — Ambulatory Visit: Admitting: Podiatry

## 2024-06-22 ENCOUNTER — Ambulatory Visit: Admitting: Podiatry

## 2024-08-01 ENCOUNTER — Ambulatory Visit (INDEPENDENT_AMBULATORY_CARE_PROVIDER_SITE_OTHER): Admitting: Primary Care
